# Patient Record
Sex: Male | Born: 1960 | Race: Black or African American | Hispanic: No | Marital: Single | Smoking: Former smoker
Health system: Southern US, Community
[De-identification: ages and names within clinical notes are randomized; demographics above are authoritative.]

---

## 2020-08-03 ENCOUNTER — Encounter (HOSPITAL_COMMUNITY): Payer: Self-pay | Admitting: Emergency Medicine

## 2020-08-03 ENCOUNTER — Inpatient Hospital Stay (HOSPITAL_COMMUNITY)
Admission: EM | Admit: 2020-08-03 | Discharge: 2020-08-21 | DRG: 064 | Disposition: A | Discharge status: Home / Self Care | Payer: Medicaid Other | Attending: Internal Medicine | Admitting: Internal Medicine

## 2020-08-03 ENCOUNTER — Emergency Department (HOSPITAL_COMMUNITY): Payer: Medicaid Other

## 2020-08-03 ENCOUNTER — Other Ambulatory Visit: Payer: Self-pay

## 2020-08-03 DIAGNOSIS — E876 Hypokalemia: Secondary | ICD-10-CM

## 2020-08-03 DIAGNOSIS — I43 Cardiomyopathy in diseases classified elsewhere: Secondary | ICD-10-CM | POA: Diagnosis present

## 2020-08-03 DIAGNOSIS — R001 Bradycardia, unspecified: Secondary | ICD-10-CM | POA: Diagnosis not present

## 2020-08-03 DIAGNOSIS — Z20822 Contact with and (suspected) exposure to covid-19: Secondary | ICD-10-CM | POA: Diagnosis present

## 2020-08-03 DIAGNOSIS — K117 Disturbances of salivary secretion: Secondary | ICD-10-CM

## 2020-08-03 DIAGNOSIS — I10 Essential (primary) hypertension: Secondary | ICD-10-CM

## 2020-08-03 DIAGNOSIS — Z800 Family history of malignant neoplasm of digestive organs: Secondary | ICD-10-CM

## 2020-08-03 DIAGNOSIS — I639 Cerebral infarction, unspecified: Secondary | ICD-10-CM | POA: Diagnosis present

## 2020-08-03 DIAGNOSIS — R471 Dysarthria and anarthria: Secondary | ICD-10-CM | POA: Diagnosis present

## 2020-08-03 DIAGNOSIS — F17210 Nicotine dependence, cigarettes, uncomplicated: Secondary | ICD-10-CM | POA: Diagnosis present

## 2020-08-03 DIAGNOSIS — R52 Pain, unspecified: Secondary | ICD-10-CM

## 2020-08-03 DIAGNOSIS — R1310 Dysphagia, unspecified: Secondary | ICD-10-CM

## 2020-08-03 DIAGNOSIS — R200 Anesthesia of skin: Secondary | ICD-10-CM

## 2020-08-03 DIAGNOSIS — R292 Abnormal reflex: Secondary | ICD-10-CM

## 2020-08-03 DIAGNOSIS — Y95 Nosocomial condition: Secondary | ICD-10-CM | POA: Diagnosis not present

## 2020-08-03 DIAGNOSIS — R109 Unspecified abdominal pain: Secondary | ICD-10-CM | POA: Diagnosis not present

## 2020-08-03 DIAGNOSIS — R54 Age-related physical debility: Secondary | ICD-10-CM | POA: Diagnosis present

## 2020-08-03 DIAGNOSIS — R258 Other abnormal involuntary movements: Secondary | ICD-10-CM | POA: Diagnosis present

## 2020-08-03 DIAGNOSIS — I69322 Dysarthria following cerebral infarction: Secondary | ICD-10-CM

## 2020-08-03 DIAGNOSIS — K5900 Constipation, unspecified: Secondary | ICD-10-CM | POA: Diagnosis not present

## 2020-08-03 DIAGNOSIS — Z681 Body mass index (BMI) 19 or less, adult: Secondary | ICD-10-CM

## 2020-08-03 DIAGNOSIS — Z4659 Encounter for fitting and adjustment of other gastrointestinal appliance and device: Secondary | ICD-10-CM

## 2020-08-03 DIAGNOSIS — I119 Hypertensive heart disease without heart failure: Secondary | ICD-10-CM | POA: Diagnosis present

## 2020-08-03 DIAGNOSIS — N179 Acute kidney failure, unspecified: Secondary | ICD-10-CM | POA: Clinically undetermined

## 2020-08-03 DIAGNOSIS — J690 Pneumonitis due to inhalation of food and vomit: Secondary | ICD-10-CM | POA: Diagnosis not present

## 2020-08-03 DIAGNOSIS — I69351 Hemiplegia and hemiparesis following cerebral infarction affecting right dominant side: Secondary | ICD-10-CM

## 2020-08-03 DIAGNOSIS — R1312 Dysphagia, oropharyngeal phase: Secondary | ICD-10-CM | POA: Diagnosis present

## 2020-08-03 DIAGNOSIS — R29810 Facial weakness: Secondary | ICD-10-CM | POA: Diagnosis present

## 2020-08-03 DIAGNOSIS — E538 Deficiency of other specified B group vitamins: Secondary | ICD-10-CM

## 2020-08-03 DIAGNOSIS — R29703 NIHSS score 3: Secondary | ICD-10-CM | POA: Diagnosis present

## 2020-08-03 DIAGNOSIS — E43 Unspecified severe protein-calorie malnutrition: Secondary | ICD-10-CM

## 2020-08-03 DIAGNOSIS — I63411 Cerebral infarction due to embolism of right middle cerebral artery: Principal | ICD-10-CM | POA: Diagnosis present

## 2020-08-03 LAB — I-STAT CHEM 8, ED
BUN: 30 mg/dL — ABNORMAL HIGH (ref 6–20)
Calcium, Ion: 1.22 mmol/L (ref 1.15–1.40)
Chloride: 109 mmol/L (ref 98–111)
Creatinine, Ser: 0.9 mg/dL (ref 0.61–1.24)
Glucose, Bld: 82 mg/dL (ref 70–99)
HCT: 41 % (ref 39.0–52.0)
Hemoglobin: 13.9 g/dL (ref 13.0–17.0)
Potassium: 3.7 mmol/L (ref 3.5–5.1)
Sodium: 145 mmol/L (ref 135–145)
TCO2: 22 mmol/L (ref 22–32)

## 2020-08-03 LAB — CBC
HCT: 41 % (ref 39.0–52.0)
Hemoglobin: 13.6 g/dL (ref 13.0–17.0)
MCH: 24.7 pg — ABNORMAL LOW (ref 26.0–34.0)
MCHC: 33.2 g/dL (ref 30.0–36.0)
MCV: 74.5 fL — ABNORMAL LOW (ref 80.0–100.0)
Platelets: 207 10*3/uL (ref 150–400)
RBC: 5.5 MIL/uL (ref 4.22–5.81)
RDW: 15.5 % (ref 11.5–15.5)
WBC: 5.2 10*3/uL (ref 4.0–10.5)
nRBC: 0 % (ref 0.0–0.2)

## 2020-08-03 LAB — COMPREHENSIVE METABOLIC PANEL
ALT: 11 U/L (ref 0–44)
AST: 16 U/L (ref 15–41)
Albumin: 4.4 g/dL (ref 3.5–5.0)
Alkaline Phosphatase: 76 U/L (ref 38–126)
Anion gap: 14 (ref 5–15)
BUN: 29 mg/dL — ABNORMAL HIGH (ref 6–20)
CO2: 21 mmol/L — ABNORMAL LOW (ref 22–32)
Calcium: 9.7 mg/dL (ref 8.9–10.3)
Chloride: 107 mmol/L (ref 98–111)
Creatinine, Ser: 1.1 mg/dL (ref 0.61–1.24)
GFR calc Af Amer: >60 mL/min (ref >60)
GFR calc non Af Amer: >60 mL/min (ref >60)
Glucose, Bld: 87 mg/dL (ref 70–99)
Potassium: 3.6 mmol/L (ref 3.5–5.1)
Sodium: 142 mmol/L (ref 135–145)
Total Bilirubin: 0.8 mg/dL (ref 0.3–1.2)
Total Protein: 7.7 g/dL (ref 6.5–8.1)

## 2020-08-03 LAB — PROTIME-INR
INR: 1.1 (ref 0.8–1.2)
Prothrombin Time: 13.4 seconds (ref 11.4–15.2)

## 2020-08-03 LAB — DIFFERENTIAL
Abs Immature Granulocytes: 0.01 10*3/uL (ref 0.00–0.07)
Basophils Absolute: 0 10*3/uL (ref 0.0–0.1)
Basophils Relative: 0 %
Eosinophils Absolute: 0 10*3/uL (ref 0.0–0.5)
Eosinophils Relative: 0 %
Immature Granulocytes: 0 %
Lymphocytes Relative: 33 %
Lymphs Abs: 1.7 10*3/uL (ref 0.7–4.0)
Monocytes Absolute: 0.4 10*3/uL (ref 0.1–1.0)
Monocytes Relative: 8 %
Neutro Abs: 3.1 10*3/uL (ref 1.7–7.7)
Neutrophils Relative %: 59 %

## 2020-08-03 LAB — APTT: aPTT: 32 seconds (ref 24–36)

## 2020-08-03 MED ORDER — IOHEXOL 350 MG/ML SOLN
75.0000 mL | Freq: Once | INTRAVENOUS | Status: AC | PRN
  Administered 2020-08-03: 75 mL via INTRAVENOUS

## 2020-08-03 MED ORDER — SODIUM CHLORIDE 0.9% FLUSH
3.0000 mL | Freq: Once | INTRAVENOUS | Status: AC
  Administered 2020-08-04: 3 mL via INTRAVENOUS

## 2020-08-03 MED ORDER — DIPHENHYDRAMINE HCL 50 MG/ML IJ SOLN
50.0000 mg | Freq: Once | INTRAMUSCULAR | Status: AC
  Administered 2020-08-03: 50 mg via INTRAVENOUS
  Filled 2020-08-03: qty 1

## 2020-08-03 NOTE — ED Notes (Signed)
Clara, sister, [phone] would like an update when available
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
## 2020-08-03 NOTE — ED Notes (Signed)
I tried to get the Patient labs twice no success.

## 2020-08-03 NOTE — ED Triage Notes (Signed)
Pt. Stated, I had some numbness yesterday and my sister brought me today. Some drooling some with mouth.

## 2020-08-03 NOTE — ED Provider Notes (Signed)
[facility] EMERGENCY DEPARTMENT Provider Note   CSN: [id number] Arrival date & time: 08/03/20  1409     History Chief Complaint  Patient presents with  . Cerebrovascular Accident    Adam [name] is a 59 y.o. male past medical history of hypertension only presents to the ED due to concerns for left arm and side numbness as well as drooling.  Patient states that this started yesterday relatively suddenly and has worsened since, denies any history of the same peer denies headache or trauma, recent change in medications, states his only medicine is lisinopril, denies drug use other than marijuana which he uses occasionally, does smoke.  The history is provided by the patient.  Illness Quality:  Left arm numbness, drooling Severity:  Severe Onset quality:  Sudden Duration:  1 day Timing:  Constant Progression:  Worsening Chronicity:  New Context:  No inciting event Associated symptoms: no abdominal pain, no chest pain, no cough, no fever, no headaches, no rash, no shortness of breath and no vomiting        History reviewed. No pertinent past medical history.  Patient Active Problem List   Diagnosis Date Noted  . Acute CVA (cerebrovascular accident) (HCC) 08/04/2020    History reviewed. No pertinent surgical history.     No family history on file.  Social History   Tobacco Use  . Smoking status: Current Every Day Smoker  . Smokeless tobacco: Never Used  Substance Use Topics  . Alcohol use: Not Currently  . Drug use: Not Currently    Home Medications Prior to Admission medications   Not on File    Allergies    Patient has no known allergies.  Review of Systems   Review of Systems  Constitutional: Negative for chills and fever.  HENT: Positive for drooling and trouble swallowing. Negative for facial swelling and voice change.   Eyes: Negative for redness and visual disturbance.  Respiratory: Negative for cough and shortness of breath.     Cardiovascular: Negative for chest pain and palpitations.  Gastrointestinal: Negative for abdominal pain and vomiting.  Genitourinary: Negative for difficulty urinating and dysuria.  Musculoskeletal: Negative for gait problem and joint swelling.  Skin: Negative for rash and wound.  Neurological: Positive for numbness. Negative for dizziness, weakness and headaches.  Psychiatric/Behavioral: Negative for agitation and confusion.    Physical Exam Updated Vital Signs BP 113/86 (BP Location: Right Arm)   Pulse 76   Temp 98.6 F (37 C) (Oral)   Resp 16   SpO2 99%   Physical Exam Constitutional:      General: He is not in acute distress. HENT:     Head: Normocephalic and atraumatic.     Mouth/Throat:     Mouth: Mucous membranes are moist.     Pharynx: Oropharynx is clear.     Comments: Facial grimace noted, grossly symmetric with drooling.  Fasciculations noted to the tongue without deviation Eyes:     General: No scleral icterus.    Extraocular Movements: Extraocular movements intact.     Pupils: Pupils are equal, round, and reactive to light.  Cardiovascular:     Rate and Rhythm: Normal rate and regular rhythm.     Pulses: Normal pulses.  Pulmonary:     Effort: Pulmonary effort is normal. No respiratory distress.  Abdominal:     General: There is no distension.     Tenderness: There is no abdominal tenderness.  Musculoskeletal:        General: No tenderness  or deformity.     Cervical back: Normal range of motion and neck supple.  Neurological:     Mental Status: He is alert and oriented to person, place, and time.     Sensory: Sensory deficit present.     Motor: Weakness present.     Gait: Gait normal.     Deep Tendon Reflexes: Reflexes abnormal.     Reflex Scores:      Brachioradialis reflexes are 4+ on the right side and 4+ on the left side.      Patellar reflexes are 4+ on the right side and 4+ on the left side.      Achilles reflexes are 4+ on the right side and 4+  on the left side.    Comments: Trace left upper extremity weakness appreciated, no sensation to noxious stimuli to the left upper arm and upper chest.  Clonus present throughout  Psychiatric:        Mood and Affect: Mood normal.        Behavior: Behavior normal.     ED Results / Procedures / Treatments   Labs (all labs ordered are listed, but only abnormal results are displayed) Labs Reviewed  CBC - Abnormal; Notable for the following components:      Result Value   MCV 74.5 (*)    MCH 24.7 (*)    All other components within normal limits  COMPREHENSIVE METABOLIC PANEL - Abnormal; Notable for the following components:   CO2 21 (*)    BUN 29 (*)    All other components within normal limits  I-STAT CHEM 8, ED - Abnormal; Notable for the following components:   BUN 30 (*)    All other components within normal limits  SARS CORONAVIRUS 2 BY RT PCR (HOSPITAL ORDER, PERFORMED IN [facility])  PROTIME-INR  APTT  DIFFERENTIAL  URINALYSIS, COMPLETE (UACMP) WITH MICROSCOPIC  AMMONIA  RAPID URINE DRUG SCREEN, HOSP PERFORMED  VITAMIN B12  FOLATE  RPR  ETHANOL  VITAMIN B1  CBG MONITORING, ED    EKG EKG Interpretation  Date/Time:  Saturday August 03 2020 14:17:04 EDT Ventricular Rate:  82 PR Interval:  178 QRS Duration: 128 QT Interval:  368 QTC Calculation: 429 R Axis:   60 Text Interpretation: Normal sinus rhythm Non-specific intra-ventricular conduction block Minimal voltage criteria for LVH, may be normal variant ( Sokolow-Lyon ) Abnormal ECG No old tracing to compare Confirmed by Meridee Score [phone]) on 08/03/2020 9:25:19 PM   Radiology CT Angio Head W or Wo Contrast  Result Date: 08/03/2020 CLINICAL DATA:  Neuro deficit, acute, stroke suspected. Additional history provided: Numbness yesterday. Drooling. EXAM: CT ANGIOGRAPHY HEAD AND NECK TECHNIQUE: Multidetector CT imaging of the head and neck was performed using the standard protocol during bolus  administration of intravenous contrast. Multiplanar CT image reconstructions and MIPs were obtained to evaluate the vascular anatomy. Carotid stenosis measurements (when applicable) are obtained utilizing NASCET criteria, using the distal internal carotid diameter as the denominator. CONTRAST:  19mL OMNIPAQUE IOHEXOL 350 MG/ML SOLN COMPARISON:  No pertinent prior exams are available for comparison. FINDINGS: CT HEAD FINDINGS Brain: Mild generalized parenchymal atrophy. There are multiple chronic appearing lacunar infarcts within the basal ganglia bilaterally. Background advanced ill-defined hypoattenuation within the cerebral white matter is nonspecific, but consistent with chronic small vessel ischemic disease. Subcentimeter age-indeterminate infarct within right cerebellar hemisphere (series 5, image 13). There is no acute intracranial hemorrhage. No demarcated cortical infarct is identified. No extra-axial fluid collection. No evidence  of intracranial mass. No midline shift. Vascular: Reported below. Skull: Normal. Negative for fracture or focal lesion. Sinuses: No significant paranasal sinus disease or mastoid effusion at the imaged levels. Orbits: No acute finding. Chronic fracture deformity of the right lamina papyracea. Review of the MIP images confirms the above findings CTA NECK FINDINGS Aortic arch: Common origin of the innominate and left common carotid arteries. The visualized aortic arch is unremarkable. No hemodynamically significant innominate or proximal subclavian artery stenosis. Right carotid system: CCA and ICA patent within the neck without stenosis. Left carotid system: CCA and ICA patent within the neck without stenosis. Vertebral arteries: Codominant and patent within the neck without stenosis Skeleton: No acute bony abnormality or aggressive osseous lesion. Cervical spondylosis with multilevel disc space narrowing, disc bulges, uncovertebral and facet hypertrophy. Other neck: No neck mass or  cervical lymphadenopathy. Thyroid unremarkable. Upper chest: No consolidation within the imaged lung apices. Review of the MIP images confirms the above findings CTA HEAD FINDINGS Anterior circulation: The intracranial internal carotid arteries are patent. The M1 middle cerebral arteries are patent. There is moderate stenosis within the distal M1 right middle cerebral artery. No definite right M2 proximal branch occlusion is identified. However, there is a paucity of visualized proximal superior division right M2 MCA branches. The right anterior cerebral artery is patent without significant proximal stenosis. There is a high-grade focal stenosis within the distal A2/proximal A3 left anterior cerebral artery. No intracranial aneurysm is identified. Posterior circulation: The intracranial vertebral arteries are patent. The basilar artery is patent. The posterior cerebral arteries are patent. There is a high-grade focal stenosis within the P2 left PCA. Additionally, there are multifocal high-grade stenoses within the P4 right PCA. Posterior communicating arteries are present bilaterally. Venous sinuses: Within limitations of contrast timing, no convincing thrombus. Anatomic variants: None significant. Review of the MIP images confirms the above findings These results were called by telephone at the time of interpretation on 08/03/2020 at 8:28 pm to provider [facility] , who verbally acknowledged these results. IMPRESSION: CT head: 1. Subcentimeter age-indeterminate infarct within the right cerebellum. 2. Multiple chronic appearing lacunar infarcts within the bilateral basal ganglia. 3. Background mild generalized parenchymal atrophy with advanced chronic small vessel ischemic disease. CTA neck: The common carotid, internal carotid and vertebral arteries are patent within the neck without hemodynamically significant stenosis. CTA head: 1. No definite proximal right M2 branch occlusion is identified. However, there is  a paucity of visualized proximal M2 superior division right MCA branch vessels. If there is concern for an acute right MCA territory stroke, consider catheter based angiography for further evaluation. 2. Intracranial atherosclerotic disease with multifocal stenoses, most notably as follows. 3. Moderate stenosis within the distal M1 right middle cerebral artery. 4. High-grade focal stenosis within the distal A2/proximal A3 left anterior cerebral artery. 5. High-grade focal stenosis within the P2 left PCA. 6. High-grade stenoses within the P4 right PCA. Electronically Signed   By: Jackey Loge DO   On: 08/03/2020 20:29   CT ANGIO NECK W OR WO CONTRAST  Result Date: 08/03/2020 CLINICAL DATA:  Neuro deficit, acute, stroke suspected. Additional history provided: Numbness yesterday. Drooling. EXAM: CT ANGIOGRAPHY HEAD AND NECK TECHNIQUE: Multidetector CT imaging of the head and neck was performed using the standard protocol during bolus administration of intravenous contrast. Multiplanar CT image reconstructions and MIPs were obtained to evaluate the vascular anatomy. Carotid stenosis measurements (when applicable) are obtained utilizing NASCET criteria, using the distal internal carotid diameter as the denominator. CONTRAST:  52mL OMNIPAQUE IOHEXOL 350 MG/ML SOLN COMPARISON:  No pertinent prior exams are available for comparison. FINDINGS: CT HEAD FINDINGS Brain: Mild generalized parenchymal atrophy. There are multiple chronic appearing lacunar infarcts within the basal ganglia bilaterally. Background advanced ill-defined hypoattenuation within the cerebral white matter is nonspecific, but consistent with chronic small vessel ischemic disease. Subcentimeter age-indeterminate infarct within right cerebellar hemisphere (series 5, image 13). There is no acute intracranial hemorrhage. No demarcated cortical infarct is identified. No extra-axial fluid collection. No evidence of intracranial mass. No midline shift.  Vascular: Reported below. Skull: Normal. Negative for fracture or focal lesion. Sinuses: No significant paranasal sinus disease or mastoid effusion at the imaged levels. Orbits: No acute finding. Chronic fracture deformity of the right lamina papyracea. Review of the MIP images confirms the above findings CTA NECK FINDINGS Aortic arch: Common origin of the innominate and left common carotid arteries. The visualized aortic arch is unremarkable. No hemodynamically significant innominate or proximal subclavian artery stenosis. Right carotid system: CCA and ICA patent within the neck without stenosis. Left carotid system: CCA and ICA patent within the neck without stenosis. Vertebral arteries: Codominant and patent within the neck without stenosis Skeleton: No acute bony abnormality or aggressive osseous lesion. Cervical spondylosis with multilevel disc space narrowing, disc bulges, uncovertebral and facet hypertrophy. Other neck: No neck mass or cervical lymphadenopathy. Thyroid unremarkable. Upper chest: No consolidation within the imaged lung apices. Review of the MIP images confirms the above findings CTA HEAD FINDINGS Anterior circulation: The intracranial internal carotid arteries are patent. The M1 middle cerebral arteries are patent. There is moderate stenosis within the distal M1 right middle cerebral artery. No definite right M2 proximal branch occlusion is identified. However, there is a paucity of visualized proximal superior division right M2 MCA branches. The right anterior cerebral artery is patent without significant proximal stenosis. There is a high-grade focal stenosis within the distal A2/proximal A3 left anterior cerebral artery. No intracranial aneurysm is identified. Posterior circulation: The intracranial vertebral arteries are patent. The basilar artery is patent. The posterior cerebral arteries are patent. There is a high-grade focal stenosis within the P2 left PCA. Additionally, there are  multifocal high-grade stenoses within the P4 right PCA. Posterior communicating arteries are present bilaterally. Venous sinuses: Within limitations of contrast timing, no convincing thrombus. Anatomic variants: None significant. Review of the MIP images confirms the above findings These results were called by telephone at the time of interpretation on 08/03/2020 at 8:28 pm to provider [facility] , who verbally acknowledged these results. IMPRESSION: CT head: 1. Subcentimeter age-indeterminate infarct within the right cerebellum. 2. Multiple chronic appearing lacunar infarcts within the bilateral basal ganglia. 3. Background mild generalized parenchymal atrophy with advanced chronic small vessel ischemic disease. CTA neck: The common carotid, internal carotid and vertebral arteries are patent within the neck without hemodynamically significant stenosis. CTA head: 1. No definite proximal right M2 branch occlusion is identified. However, there is a paucity of visualized proximal M2 superior division right MCA branch vessels. If there is concern for an acute right MCA territory stroke, consider catheter based angiography for further evaluation. 2. Intracranial atherosclerotic disease with multifocal stenoses, most notably as follows. 3. Moderate stenosis within the distal M1 right middle cerebral artery. 4. High-grade focal stenosis within the distal A2/proximal A3 left anterior cerebral artery. 5. High-grade focal stenosis within the P2 left PCA. 6. High-grade stenoses within the P4 right PCA. Electronically Signed   By: Jackey Loge DO   On: 08/03/2020 20:29  Procedures Procedures (including critical care time)  Medications Ordered in ED Medications  sodium chloride flush (NS) 0.9 % injection 3 mL (has no administration in time range)  iohexol (OMNIPAQUE) 350 MG/ML injection 75 mL (75 mLs Intravenous Contrast Given 08/03/20 1950)  diphenhydrAMINE (BENADRYL) injection 50 mg (50 mg Intravenous Given  08/03/20 2332)    ED Course  I have reviewed the triage vital signs and the nursing notes.  Pertinent labs & imaging results that were available during my care of the patient were reviewed by me and considered in my medical decision making (see chart for details).  Clinical Course as of Aug 04 26  Sat Aug 03, 2020  [id number] 59 year old male here for evaluation of difficulty swallowing and some left arm and chest numbness.  Sounds like it started last evening.  He was evaluated at triage and had imaging done that shows significant stenotic findings on angio.  He is ambulatory in the room and does not seem to have any focal weakness.  His his mouth is drawn up in a grimace but he is able to speak without any difficulty.  Drooling.  Smokes cigarettes denies any drugs.  Infrequent marijuana.  Only on lisinopril.  Discussed with neurology who will evaluate the patient in the ED.   [MB]    Clinical Course User Index [MB] Terrilee FilesButler, Michael C, MD   MDM Rules/Calculators/A&P                          EKG findings by my read: Compared to prior: None.  Rate: 82 rhythm: sinus Axis: appropriate  PR: 130 QRS: 128 QTc: 429.  High voltage EKG, likely due to the patient's habitus with some baseline artifact limiting interpretation, otherwise no evidence of ischemia or arrhythmia, nor any other pathologic findings concerning considering patient presentation. Findings discussed with attending who agrees.  Differential diagnosis considered:  CVA, metabolic encephalopathy, demyelinating polyneuropathy, malignancy, encephalitis  Patient presents the ED for 1 day of focal numbness to the left upper chest wall and left upper extremity and my exam is concerning for severe hyper reflexia throughout along with some degree of pharyngeal hypotonia of uncertain etiology rendering the patient unable to tolerate secretions well.  CTA obtained from the lobby with numerous foci of microvascular disease, some stenosis of the right  MCA as well as bilateral PCAs.  This does not correlate with well with patient's exam but does indicate severe vasculopathic disease  Given his nonspecific very abnormal neurological exam, neurology consulted for recommendations, will add on RPR, B12, folate and a toxicologic work-up including UDS  Per neurology's history and exam, the also noted the patient has lost significant amount of weight recently and they are more concerned for possible malignancy versus space-occupying lesion of the brainstem, suggesting MRI brain with/without.  Hospital medicine consulted for admission, accepted by Dr. Margo AyeHall.  Labs reviewed and interpreted by myself with significant findings above. Imaging reviewed by myself and interpreted by radiologist.  Case and plan above discussed with my attending Dr. Charm BargesButler   Final Clinical Impression(s) / ED Diagnoses Final diagnoses:  Drooling  Left arm numbness  Generalized hyperreflexia    Rx / DC Orders ED Discharge Orders    None     Labs, studies and imaging reviewed by myself and considered in medical decision making if ordered. Imaging interpreted by radiology. Pt was discussed with my attending, Dr. Charm BargesButler  Electronically signed by:  Christiane HaJonathan Redding9/12/202112:27 AM  Loree Fee, MD 08/04/20 0028    Terrilee Files, MD 08/04/20 1100

## 2020-08-04 ENCOUNTER — Inpatient Hospital Stay (HOSPITAL_COMMUNITY): Payer: Medicaid Other

## 2020-08-04 ENCOUNTER — Other Ambulatory Visit (HOSPITAL_COMMUNITY): Payer: Medicaid Other

## 2020-08-04 DIAGNOSIS — F17210 Nicotine dependence, cigarettes, uncomplicated: Secondary | ICD-10-CM | POA: Diagnosis present

## 2020-08-04 DIAGNOSIS — I69351 Hemiplegia and hemiparesis following cerebral infarction affecting right dominant side: Secondary | ICD-10-CM | POA: Diagnosis not present

## 2020-08-04 DIAGNOSIS — K117 Disturbances of salivary secretion: Secondary | ICD-10-CM | POA: Diagnosis present

## 2020-08-04 DIAGNOSIS — I63411 Cerebral infarction due to embolism of right middle cerebral artery: Secondary | ICD-10-CM | POA: Diagnosis present

## 2020-08-04 DIAGNOSIS — E43 Unspecified severe protein-calorie malnutrition: Secondary | ICD-10-CM | POA: Diagnosis present

## 2020-08-04 DIAGNOSIS — E538 Deficiency of other specified B group vitamins: Secondary | ICD-10-CM | POA: Diagnosis present

## 2020-08-04 DIAGNOSIS — R258 Other abnormal involuntary movements: Secondary | ICD-10-CM | POA: Diagnosis present

## 2020-08-04 DIAGNOSIS — I43 Cardiomyopathy in diseases classified elsewhere: Secondary | ICD-10-CM | POA: Diagnosis present

## 2020-08-04 DIAGNOSIS — R1310 Dysphagia, unspecified: Secondary | ICD-10-CM | POA: Diagnosis not present

## 2020-08-04 DIAGNOSIS — K5900 Constipation, unspecified: Secondary | ICD-10-CM | POA: Diagnosis not present

## 2020-08-04 DIAGNOSIS — I69322 Dysarthria following cerebral infarction: Secondary | ICD-10-CM | POA: Diagnosis not present

## 2020-08-04 DIAGNOSIS — R54 Age-related physical debility: Secondary | ICD-10-CM | POA: Diagnosis present

## 2020-08-04 DIAGNOSIS — D518 Other vitamin B12 deficiency anemias: Secondary | ICD-10-CM

## 2020-08-04 DIAGNOSIS — I6389 Other cerebral infarction: Secondary | ICD-10-CM | POA: Diagnosis not present

## 2020-08-04 DIAGNOSIS — R1312 Dysphagia, oropharyngeal phase: Secondary | ICD-10-CM | POA: Diagnosis present

## 2020-08-04 DIAGNOSIS — I639 Cerebral infarction, unspecified: Secondary | ICD-10-CM | POA: Diagnosis not present

## 2020-08-04 DIAGNOSIS — Z800 Family history of malignant neoplasm of digestive organs: Secondary | ICD-10-CM | POA: Diagnosis not present

## 2020-08-04 DIAGNOSIS — R471 Dysarthria and anarthria: Secondary | ICD-10-CM | POA: Diagnosis present

## 2020-08-04 DIAGNOSIS — E440 Moderate protein-calorie malnutrition: Secondary | ICD-10-CM

## 2020-08-04 DIAGNOSIS — I119 Hypertensive heart disease without heart failure: Secondary | ICD-10-CM | POA: Diagnosis present

## 2020-08-04 DIAGNOSIS — N179 Acute kidney failure, unspecified: Secondary | ICD-10-CM | POA: Diagnosis present

## 2020-08-04 DIAGNOSIS — Z681 Body mass index (BMI) 19 or less, adult: Secondary | ICD-10-CM | POA: Diagnosis not present

## 2020-08-04 DIAGNOSIS — R29810 Facial weakness: Secondary | ICD-10-CM | POA: Diagnosis present

## 2020-08-04 DIAGNOSIS — J690 Pneumonitis due to inhalation of food and vomit: Secondary | ICD-10-CM | POA: Diagnosis not present

## 2020-08-04 DIAGNOSIS — R109 Unspecified abdominal pain: Secondary | ICD-10-CM | POA: Diagnosis not present

## 2020-08-04 DIAGNOSIS — I10 Essential (primary) hypertension: Secondary | ICD-10-CM | POA: Diagnosis not present

## 2020-08-04 DIAGNOSIS — R29703 NIHSS score 3: Secondary | ICD-10-CM | POA: Diagnosis present

## 2020-08-04 DIAGNOSIS — R001 Bradycardia, unspecified: Secondary | ICD-10-CM | POA: Diagnosis not present

## 2020-08-04 DIAGNOSIS — Y95 Nosocomial condition: Secondary | ICD-10-CM | POA: Diagnosis not present

## 2020-08-04 DIAGNOSIS — E876 Hypokalemia: Secondary | ICD-10-CM | POA: Diagnosis present

## 2020-08-04 DIAGNOSIS — Z20822 Contact with and (suspected) exposure to covid-19: Secondary | ICD-10-CM | POA: Diagnosis present

## 2020-08-04 HISTORY — DX: Unspecified severe protein-calorie malnutrition: E43

## 2020-08-04 LAB — ETHANOL: Alcohol, Ethyl (B): <10 mg/dL (ref <10)

## 2020-08-04 LAB — LIPID PANEL
Cholesterol: 123 mg/dL (ref 0–200)
HDL: 33 mg/dL — ABNORMAL LOW (ref >40)
LDL Cholesterol: 80 mg/dL (ref 0–99)
Total CHOL/HDL Ratio: 3.7 RATIO
Triglycerides: 49 mg/dL (ref <150)
VLDL: 10 mg/dL (ref 0–40)

## 2020-08-04 LAB — VITAMIN B12: Vitamin B-12: 90 pg/mL — ABNORMAL LOW (ref 180–914)

## 2020-08-04 LAB — SARS CORONAVIRUS 2 BY RT PCR (HOSPITAL ORDER, PERFORMED IN ~~LOC~~ HOSPITAL LAB): SARS Coronavirus 2: NEGATIVE

## 2020-08-04 LAB — AMMONIA: Ammonia: 37 umol/L — ABNORMAL HIGH (ref 9–35)

## 2020-08-04 LAB — RPR: RPR Ser Ql: NONREACTIVE

## 2020-08-04 LAB — FOLATE: Folate: 18.1 ng/mL (ref >5.9)

## 2020-08-04 MED ORDER — CLOPIDOGREL BISULFATE 75 MG PO TABS: 75.0000 mg | ORAL_TABLET | Freq: Every day | ORAL | Status: DC

## 2020-08-04 MED ORDER — HYDROCHLOROTHIAZIDE 25 MG PO TABS
25.0000 mg | ORAL_TABLET | Freq: Every day | ORAL | Status: DC
  Administered 2020-08-04 – 2020-08-10 (×5): 25 mg via ORAL
  Filled 2020-08-04 (×6): qty 1

## 2020-08-04 MED ORDER — ATORVASTATIN CALCIUM 80 MG PO TABS
80.0000 mg | ORAL_TABLET | Freq: Every day | ORAL | Status: DC
  Administered 2020-08-05 – 2020-08-10 (×4): 80 mg via ORAL
  Filled 2020-08-04 (×3): qty 1
  Filled 2020-08-04: qty 2
  Filled 2020-08-04: qty 1

## 2020-08-04 MED ORDER — ASPIRIN 300 MG RE SUPP
300.0000 mg | Freq: Every day | RECTAL | Status: DC
  Administered 2020-08-04 (×2): 300 mg via RECTAL
  Filled 2020-08-04 (×2): qty 1

## 2020-08-04 MED ORDER — ENOXAPARIN SODIUM 40 MG/0.4ML ~~LOC~~ SOLN
40.0000 mg | Freq: Every day | SUBCUTANEOUS | Status: DC
  Administered 2020-08-04 – 2020-08-21 (×17): 40 mg via SUBCUTANEOUS
  Filled 2020-08-04 (×17): qty 0.4

## 2020-08-04 MED ORDER — VITAMIN B-12 1000 MCG PO TABS
500.0000 ug | ORAL_TABLET | Freq: Every day | ORAL | Status: DC
  Filled 2020-08-04: qty 1

## 2020-08-04 MED ORDER — TAB-A-VITE/IRON PO TABS
1.0000 | ORAL_TABLET | Freq: Every day | ORAL | Status: DC
  Administered 2020-08-05 – 2020-08-10 (×4): 1 via ORAL
  Filled 2020-08-04 (×7): qty 1

## 2020-08-04 MED ORDER — VITAMIN B-12 1000 MCG PO TABS
1000.0000 ug | ORAL_TABLET | Freq: Every day | ORAL | Status: DC
  Administered 2020-08-07 – 2020-08-10 (×3): 1000 ug via ORAL
  Filled 2020-08-04 (×5): qty 1

## 2020-08-04 MED ORDER — GADOBUTROL 1 MMOL/ML IV SOLN
6.0000 mL | Freq: Once | INTRAVENOUS | Status: AC | PRN
  Administered 2020-08-04: 6 mL via INTRAVENOUS

## 2020-08-04 MED ORDER — ASPIRIN 81 MG PO CHEW
81.0000 mg | CHEWABLE_TABLET | Freq: Every day | ORAL | Status: DC
  Administered 2020-08-05: 81 mg via ORAL
  Filled 2020-08-04 (×2): qty 1

## 2020-08-04 MED ORDER — CYANOCOBALAMIN 1000 MCG/ML IJ SOLN
1000.0000 ug | Freq: Once | INTRAMUSCULAR | Status: AC
  Administered 2020-08-04: 1000 ug via INTRAMUSCULAR
  Filled 2020-08-04 (×2): qty 1

## 2020-08-04 MED ORDER — DEXTROSE-NACL 5-0.9 % IV SOLN: INTRAVENOUS | Status: DC

## 2020-08-04 MED ORDER — HYDRALAZINE HCL 20 MG/ML IJ SOLN: 5.0000 mg | Freq: Four times a day (QID) | INTRAMUSCULAR | Status: DC | PRN

## 2020-08-04 MED ORDER — CLOPIDOGREL BISULFATE 75 MG PO TABS
75.0000 mg | ORAL_TABLET | Freq: Every day | ORAL | Status: DC
  Administered 2020-08-05 – 2020-08-10 (×4): 75 mg via ORAL
  Filled 2020-08-04 (×5): qty 1

## 2020-08-04 MED ORDER — RESOURCE THICKENUP CLEAR PO POWD
ORAL | Status: DC | PRN
  Filled 2020-08-04 (×3): qty 125

## 2020-08-04 MED ORDER — THIAMINE HCL 100 MG PO TABS
100.0000 mg | ORAL_TABLET | Freq: Every day | ORAL | Status: DC
  Administered 2020-08-04 – 2020-08-10 (×5): 100 mg via ORAL
  Filled 2020-08-04 (×6): qty 1
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
## 2020-08-04 NOTE — Progress Notes (Addendum)
PROGRESS NOTE    Adam [name]  ZOX:[id number] DOB: [birth year] DOA: 08/03/2020 PCP: Patient, [name]    Brief Narrative:  Patient admitted to the hospital with a working diagnosis of right cerebellar infarct.  59 year old male with significant past medical history for hypertension, prior CVA 2013. She reported left arm numbness and drooling. She also had difficulty speaking and abnormal gait. On her initial physical examination blood pressure 113/86, heart rate 76, respiratory rate 16, oxygen saturation 99%. Her lungs were clear to auscultation bilaterally, heart S1-S2, present rhythmic, soft abdomen, no lower extremity edema. Patient was awake and alert, she had numbness at the left upper extremity. Strength preserved. Sodium 142, potassium 3.6, chloride 107, bicarb 21, glucose 87, BUN 29, creatinine 1.1, white count 5.2, hemoglobin 13.6, hematocrit 41.0, platelets 207. SARS COVID-19 negative.  Head CT with subcentimeter age indeterminate infarct within the right cerebellum. Multiple chronic appearing lacunar infarcts within bilateral basal ganglia. CT angiography of the neck no significant stenosis. CT angiography of the head no definite proximal right M2 branch occlusion identified. Intracranial atherosclerosis disease with multifocal stenosis. High-grade focal stenosis distal A2/proximal A3 left anterior cerebral artery. High-grade focal stenosis within the P2 left PCA, high-grade stenosis within P4 right PCA. EKG 82 bpm, normal axis, normal intervals, sinus rhythm, poor R wave progression, no ST segment or T wave changes, positive LVH.   Assessment & Plan:   Principal Problem:   Acute CVA (cerebrovascular accident) (HCC) Active Problems:   HTN (hypertension)   B12 deficiency   Protein-calorie malnutrition, moderate (HCC)   1. Acute right cerebellum ischemic infarct. Continue to have speech difficulty, no nausea or vomiting. Further work up with brain MRI   Continue neuro  checks per unit protocol, antiplatelet therapy with asa and clopidogrel, statin therapy and blood pressure control.   Pending echocardiogram, patient may need TEE and loop recorder, case discussed with Dr Pearlean Brownie from Neurology.  Pending echocardiogram, PT and OT evaluation. Follow up on lipid panel and Hgb A1c.  Pending urine drug screen.   2. B12 deficiency/ moderate to severe calorie protein malnutrition. B 12 level 90, folate 181. Continue with B12, thiamine and multivitamins supplementation and consult nutrition for other nutritional supplements.   3. Uncontrolled HTN. Blood pressure 175/105 mmHg. Positive hypertensive cardiomyopathy features per EKG.  Will start patient on HCTZ and continue adjustments, avoid rapid blood pressure correction in the setting of acute CVA. Target blood pressure less than 180/90.     Status is: Inpatient  Remains inpatient appropriate because:Inpatient level of care appropriate due to severity of illness   Dispo:  Patient From: Home  Planned Disposition: Home with Health Care Svc  Expected discharge date: 08/06/20  Medically stable for discharge: No    DVT prophylaxis: Enoxaparin   Code Status:   full  Family Communication:  No family at the bedside      Consultants:   Neurology     Subjective: Patient is awake and alert, his symptoms are improving but not yet back to baseline, he is very weak and deconditioned.   Objective: Vitals:   08/04/20 0530 08/04/20 0745 08/04/20 0830 08/04/20 0915  BP:  (!) 175/102 (!) 165/101 (!) 167/119  Pulse: (!) 52 (!) 57 64 (!) 58  Resp: 17 20 18  (!) 23  Temp:      TempSrc:      SpO2: 97% 98% 98% 97%   No intake or output data in the 24 hours ending 08/04/20 1155 There were no vitals filed  for this visit.  Examination:   General: Not in pain or dyspnea, deconditioned  Neurology: Awake and alert, preserved strength, no facial asymmetry  E ENT: no pallor, no icterus, oral mucosa  moist Cardiovascular: No JVD. S1-S2 present, rhythmic, no gallops, rubs, or murmurs. No lower extremity edema. Pulmonary: vesicular breath sounds bilaterally, adequate air movement, no wheezing, rhonchi or rales. Gastrointestinal. Abdomen soft and non tender SKIN: no rashes Musculoskeletal: no joint deformities     Data Reviewed: I have personally reviewed following labs and imaging studies  CBC: Recent Labs  Lab 08/03/20 1427 08/03/20 1438  WBC 5.2  --   NEUTROABS 3.1  --   HGB 13.6 13.9  HCT 41.0 41.0  MCV 74.5*  --   PLT 207  --    Basic Metabolic Panel: Recent Labs  Lab 08/03/20 1427 08/03/20 1438  NA 142 145  K 3.6 3.7  CL 107 109  CO2 21*  --   GLUCOSE 87 82  BUN 29* 30*  CREATININE 1.10 0.90  CALCIUM 9.7  --    GFR: CrCl cannot be calculated (Unknown ideal weight.). Liver Function Tests: Recent Labs  Lab 08/03/20 1427  AST 16  ALT 11  ALKPHOS 76  BILITOT 0.8  PROT 7.7  ALBUMIN 4.4   No results for input(s): LIPASE, AMYLASE in the last 168 hours. Recent Labs  Lab 08/03/20 2148  AMMONIA 37*   Coagulation Profile: Recent Labs  Lab 08/03/20 1427  INR 1.1   Cardiac Enzymes: No results for input(s): CKTOTAL, CKMB, CKMBINDEX, TROPONINI in the last 168 hours. BNP (last 3 results) No results for input(s): PROBNP in the last 8760 hours. HbA1C: No results for input(s): HGBA1C in the last 72 hours. CBG: No results for input(s): GLUCAP in the last 168 hours. Lipid Profile: No results for input(s): CHOL, HDL, LDLCALC, TRIG, CHOLHDL, LDLDIRECT in the last 72 hours. Thyroid Function Tests: No results for input(s): TSH, T4TOTAL, FREET4, T3FREE, THYROIDAB in the last 72 hours. Anemia Panel: Recent Labs    08/03/20 2355  VITAMINB12 90*  FOLATE 18.1      Radiology Studies: I have reviewed all of the imaging during this hospital visit personally     Scheduled Meds: . [START ON 08/05/2020] aspirin  81 mg Oral Daily  . aspirin  300 mg Rectal  Daily  . atorvastatin  80 mg Oral Daily  . enoxaparin (LOVENOX) injection  40 mg Subcutaneous Daily  . sodium chloride flush  3 mL Intravenous Once  . vitamin B-12  500 mcg Oral Daily   Continuous Infusions: . dextrose 5 % and 0.9% NaCl 30 mL/hr at 08/04/20 0314     LOS: 0 days        Coralyn Roselli Annett Gula, MD

## 2020-08-04 NOTE — ED Notes (Signed)
Shawn sister in law [phone] would like an update on the pt

## 2020-08-04 NOTE — ED Notes (Signed)
[phone], Huel Coventry brother and Elzie Rings would like an update

## 2020-08-04 NOTE — Evaluation (Signed)
Clinical/Bedside Swallow Evaluation Patient Details  Name: Adam [name] MRN: [id number] Date of Birth: 09/11/1961  Today's Date: 08/04/2020 Time: SLP Start Time (ACUTE ONLY): 1220 SLP Stop Time (ACUTE ONLY): 1240 SLP Time Calculation (min) (ACUTE ONLY): 20 min  Past Medical History: History reviewed. No pertinent past medical history. Past Surgical History: History reviewed. No pertinent surgical history. HPI:  Patient is a 59 y.o. 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 Per chart review, his sister had reported she noticed bifacial weakness, significant worsened speech and significantly worsened gait. MRI brain revealed patchy small volume acute right MCA territory infarcts involving right frontoparietal region but no associated hemorrhage or mass effect, as well as underlying advanced cerebral atrophy and chronic microvascular ischemic disease, multiple remote lacunar infarcts about the basal ganglia and right cerebellum.   Assessment / Plan / Recommendation Clinical Impression  Patient presents with a moderate-severe oral and a moderate pharyngeal dysphagia but without overt s/s of aspiration or penetration. He had an open-mouth posture at rest and although he would verbally respond to questions, he did not initiate to comment or request and in general, appeared to be non-verbal. Patient with odd affect overall and accepted liquids, solids from SLP but was flat and nonresponsive. 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 He also exhibited oral holding of thin and honey thick liquids leading to anterior spillage as he would have an open mouth posture unless actively swallowing. Although patient did reach for towel to manage anterior spillage, he did not attempt to close lips or  swallow oral residuals. 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 SLP Visit Diagnosis: Dysphagia, unspecified (R13.10)    Aspiration Risk  Mild aspiration risk;Moderate aspiration risk    Diet Recommendation Dysphagia 1 (Puree);Honey-thick liquid   Liquid Administration via: Cup Medication Administration: Crushed with puree Supervision: Full supervision/cueing for compensatory strategies Compensations: Minimize environmental distractions;Slow rate;Small sips/bites;Monitor for anterior loss Postural Changes: Seated upright at 90 degrees    Other  Recommendations Oral Care Recommendations: Oral care BID;Staff/trained caregiver to provide oral care   Follow up Recommendations 24 hour supervision/assistance;Skilled Nursing facility;Inpatient Rehab      Frequency and Duration min 2x/week  2 weeks       Prognosis Prognosis for Safe Diet Advancement: Good      Swallow Study   General Date of Onset: 08/04/20 HPI: Patient is a 59 y.o. 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 Per chart review, his sister had reported she noticed bifacial weakness, significant worsened speech and significantly worsened gait. MRI brain revealed patchy small volume acute right MCA territory infarcts involving right frontoparietal region but no associated hemorrhage or mass effect, as well as underlying advanced cerebral atrophy and chronic microvascular ischemic disease, multiple remote lacunar infarcts about the basal ganglia and right cerebellum. Type of Study: Bedside Swallow Evaluation Previous Swallow Assessment: N/A Diet Prior to this Study: NPO Temperature Spikes Noted: No Respiratory Status: Room air History of Recent Intubation: No Behavior/Cognition: Alert;Cooperative;Pleasant mood;Impulsive Oral  Cavity Assessment: Within Functional Limits Oral Care Completed by SLP: Yes Oral Cavity - Dentition: Adequate natural dentition Vision: Functional for self-feeding Self-Feeding Abilities: Able to feed self Patient  Positioning: Upright in bed Baseline Vocal Quality: Low vocal intensity Volitional Cough: Cognitively unable to elicit Volitional Swallow: Unable to elicit    Oral/Motor/Sensory Function Facial Symmetry: Within Functional Limits Facial Strength: Within Functional Limits Lingual Symmetry: Abnormal symmetry right Lingual Strength: Within Functional Limits Mandible: Other (Comment) (open mouth posture at rest)   Ice Chips Ice chips: Impaired Oral Phase Impairments: Impaired mastication Other Comments: Patient only attempted very light ineffective mastication of small ice chip when SLP cued him.   Thin Liquid Thin Liquid: Impaired Presentation: Straw Oral Phase Impairments: Reduced labial seal;Reduced lingual movement/coordination Oral Phase Functional Implications: Prolonged oral transit;Oral holding;Right anterior spillage;Left anterior spillage Pharyngeal  Phase Impairments: Suspected delayed Swallow Other Comments: No overt s/s of aspiration or penetration    Nectar Thick     Honey Thick Honey Thick Liquid: Impaired Presentation: Cup;Self fed Oral Phase Impairments: Reduced labial seal;Poor awareness of bolus Oral Phase Functional Implications: Oral holding;Left anterior spillage;Right anterior spillage;Prolonged oral transit Other Comments: Patient initiated swallow but with oral residuals and oral holding with  honey thick liquids which would start to spill out of mouth; patient would not close mouth despite him frequently grabbing towel to catch spillage.   Puree Puree: Impaired Oral Phase Impairments: Poor awareness of bolus Oral Phase Functional Implications: Prolonged oral transit   Solid     Solid: Not tested      Angela Nevin, MA, CCC-SLP 08/04/20 2:20  PM

## 2020-08-04 NOTE — H&P (Addendum)
History and Physical  Adam [name] UJW:[id number] DOB: 11/21/1961 DOA: 08/03/2020  Referring physician: Dr. Jeanie Seweredding PCP: Patient, [name]  Outpatient Specialists: None Patient coming from: Home  Chief Complaint: Left arm numbness and drooling.  HPI: Adam [name] is a 59 y.o. male with medical history significant for essential hypertension, prior CVA in 2013, seen in [place], who presented [facility] from home with complaints of left arm numbness and drooling.  Last known well was at 8AM on 08/02/2020.  Associated with speech difficulty and abnormal gait.  Was not taking an aspirin or statin at home.  Not taking any medication.  He lives with his sister who noted the changes mentioned above and had him come to the ED for further evaluation.  Denies exposure to COVID-19 virus.  Received his first dose of COVID-19 vaccine in June 2021, not sure which one.  ED Course: Persistent left arm numbness, drooling appears improved on exam.  Vital signs and labs unremarkable.  CT head with findings as stated below.  TRH asked to admit.  CT head: 1. Subcentimeter age-indeterminate infarct within the right cerebellum. 2. Multiple chronic appearing lacunar infarcts within the bilateral basal ganglia. 3. Background mild generalized parenchymal atrophy with advanced chronic small vessel ischemic disease. CT head with contrast: 1. No definite proximal right M2 branch occlusion is identified. However, there is a paucity of visualized proximal M2 superior division right MCA branch vessels. If there is concern for an acute right MCA territory stroke, consider catheter based angiography for further evaluation. 2. Intracranial atherosclerotic disease with multifocal stenoses, most notably as follows. 3. Moderate stenosis within the distal M1 right middle cerebral artery. 4. High-grade focal stenosis within the distal A2/proximal A3 left anterior cerebral artery. 5. High-grade focal stenosis within the  P2 left PCA. 6. High-grade stenoses within the P4 right PCA  Review of Systems: Review of systems as noted in the HPI. All other systems reviewed and are negative.  Past medical history: Prior CVA in 2013, seen in [place].  History reviewed. No pertinent surgical history.  Social History:  reports that he has been smoking. He has never used smokeless tobacco. He reports previous alcohol use. He reports previous drug use.   No Known Allergies  Family history: Mother with history of breast and colon cancer.  Prior to Admission medications   Not on File  Was not taking any medications at home.  Physical Exam: BP 113/86 (BP Location: Right Arm)   Pulse 76   Temp 98.6 F (37 C) (Oral)   Resp 16   SpO2 99%   . General: 59 y.o. year-old male well developed well nourished in no acute distress.  Alert and oriented x3. . Cardiovascular: Regular rate and rhythm with no rubs or gallops.  No thyromegaly or JVD noted.  No lower extremity edema. 2/4 pulses in all 4 extremities. Marland Kitchen. Respiratory: Clear to auscultation with no wheezes or rales. Good inspiratory effort. . Abdomen: Soft nontender nondistended with normal bowel sounds x4 quadrants. . Muskuloskeletal: No cyanosis, clubbing or edema noted bilaterally . Neuro: CN II-XII intact, strength intact.  Persistent numbness affecting left upper extremity. . Skin: No ulcerative lesions noted or rashes . Psychiatry: Judgement and insight appear normal. Mood is appropriate for condition and setting          Labs on Admission:  Basic Metabolic Panel: Recent Labs  Lab 08/03/20 1427 08/03/20 1438  NA 142 145  K 3.6 3.7  CL 107 109  CO2 21*  --  GLUCOSE 87 82  BUN 29* 30*  CREATININE 1.10 0.90  CALCIUM 9.7  --    Liver Function Tests: Recent Labs  Lab 08/03/20 1427  AST 16  ALT 11  ALKPHOS 76  BILITOT 0.8  PROT 7.7  ALBUMIN 4.4   No results for input(s): LIPASE, AMYLASE in the last 168 hours. No results for input(s):  AMMONIA in the last 168 hours. CBC: Recent Labs  Lab 08/03/20 1427 08/03/20 1438  WBC 5.2  --   NEUTROABS 3.1  --   HGB 13.6 13.9  HCT 41.0 41.0  MCV 74.5*  --   PLT 207  --    Cardiac Enzymes: No results for input(s): CKTOTAL, CKMB, CKMBINDEX, TROPONINI in the last 168 hours.  BNP (last 3 results) No results for input(s): BNP in the last 8760 hours.  ProBNP (last 3 results) No results for input(s): PROBNP in the last 8760 hours.  CBG: No results for input(s): GLUCAP in the last 168 hours.  Radiological Exams on Admission: CT Angio Head W or Wo Contrast  Result Date: 08/03/2020 CLINICAL DATA:  Neuro deficit, acute, stroke suspected. Additional history provided: Numbness yesterday. Drooling. EXAM: CT ANGIOGRAPHY HEAD AND NECK TECHNIQUE: Multidetector CT imaging of the head and neck was performed using the standard protocol during bolus administration of intravenous contrast. Multiplanar CT image reconstructions and MIPs were obtained to evaluate the vascular anatomy. Carotid stenosis measurements (when applicable) are obtained utilizing NASCET criteria, using the distal internal carotid diameter as the denominator. CONTRAST:  28mL OMNIPAQUE IOHEXOL 350 MG/ML SOLN COMPARISON:  No pertinent prior exams are available for comparison. FINDINGS: CT HEAD FINDINGS Brain: Mild generalized parenchymal atrophy. There are multiple chronic appearing lacunar infarcts within the basal ganglia bilaterally. Background advanced ill-defined hypoattenuation within the cerebral white matter is nonspecific, but consistent with chronic small vessel ischemic disease. Subcentimeter age-indeterminate infarct within right cerebellar hemisphere (series 5, image 13). There is no acute intracranial hemorrhage. No demarcated cortical infarct is identified. No extra-axial fluid collection. No evidence of intracranial mass. No midline shift. Vascular: Reported below. Skull: Normal. Negative for fracture or focal lesion.  Sinuses: No significant paranasal sinus disease or mastoid effusion at the imaged levels. Orbits: No acute finding. Chronic fracture deformity of the right lamina papyracea. Review of the MIP images confirms the above findings CTA NECK FINDINGS Aortic arch: Common origin of the innominate and left common carotid arteries. The visualized aortic arch is unremarkable. No hemodynamically significant innominate or proximal subclavian artery stenosis. Right carotid system: CCA and ICA patent within the neck without stenosis. Left carotid system: CCA and ICA patent within the neck without stenosis. Vertebral arteries: Codominant and patent within the neck without stenosis Skeleton: No acute bony abnormality or aggressive osseous lesion. Cervical spondylosis with multilevel disc space narrowing, disc bulges, uncovertebral and facet hypertrophy. Other neck: No neck mass or cervical lymphadenopathy. Thyroid unremarkable. Upper chest: No consolidation within the imaged lung apices. Review of the MIP images confirms the above findings CTA HEAD FINDINGS Anterior circulation: The intracranial internal carotid arteries are patent. The M1 middle cerebral arteries are patent. There is moderate stenosis within the distal M1 right middle cerebral artery. No definite right M2 proximal branch occlusion is identified. However, there is a paucity of visualized proximal superior division right M2 MCA branches. The right anterior cerebral artery is patent without significant proximal stenosis. There is a high-grade focal stenosis within the distal A2/proximal A3 left anterior cerebral artery. No intracranial aneurysm is identified. Posterior circulation: The  intracranial vertebral arteries are patent. The basilar artery is patent. The posterior cerebral arteries are patent. There is a high-grade focal stenosis within the P2 left PCA. Additionally, there are multifocal high-grade stenoses within the P4 right PCA. Posterior communicating  arteries are present bilaterally. Venous sinuses: Within limitations of contrast timing, no convincing thrombus. Anatomic variants: None significant. Review of the MIP images confirms the above findings These results were called by telephone at the time of interpretation on 08/03/2020 at 8:28 pm to provider [facility] , who verbally acknowledged these results. IMPRESSION: CT head: 1. Subcentimeter age-indeterminate infarct within the right cerebellum. 2. Multiple chronic appearing lacunar infarcts within the bilateral basal ganglia. 3. Background mild generalized parenchymal atrophy with advanced chronic small vessel ischemic disease. CTA neck: The common carotid, internal carotid and vertebral arteries are patent within the neck without hemodynamically significant stenosis. CTA head: 1. No definite proximal right M2 branch occlusion is identified. However, there is a paucity of visualized proximal M2 superior division right MCA branch vessels. If there is concern for an acute right MCA territory stroke, consider catheter based angiography for further evaluation. 2. Intracranial atherosclerotic disease with multifocal stenoses, most notably as follows. 3. Moderate stenosis within the distal M1 right middle cerebral artery. 4. High-grade focal stenosis within the distal A2/proximal A3 left anterior cerebral artery. 5. High-grade focal stenosis within the P2 left PCA. 6. High-grade stenoses within the P4 right PCA. Electronically Signed   By: Jackey Loge DO   On: 08/03/2020 20:29   CT ANGIO NECK W OR WO CONTRAST  Result Date: 08/03/2020 CLINICAL DATA:  Neuro deficit, acute, stroke suspected. Additional history provided: Numbness yesterday. Drooling. EXAM: CT ANGIOGRAPHY HEAD AND NECK TECHNIQUE: Multidetector CT imaging of the head and neck was performed using the standard protocol during bolus administration of intravenous contrast. Multiplanar CT image reconstructions and MIPs were obtained to evaluate the  vascular anatomy. Carotid stenosis measurements (when applicable) are obtained utilizing NASCET criteria, using the distal internal carotid diameter as the denominator. CONTRAST:  75mL OMNIPAQUE IOHEXOL 350 MG/ML SOLN COMPARISON:  No pertinent prior exams are available for comparison. FINDINGS: CT HEAD FINDINGS Brain: Mild generalized parenchymal atrophy. There are multiple chronic appearing lacunar infarcts within the basal ganglia bilaterally. Background advanced ill-defined hypoattenuation within the cerebral white matter is nonspecific, but consistent with chronic small vessel ischemic disease. Subcentimeter age-indeterminate infarct within right cerebellar hemisphere (series 5, image 13). There is no acute intracranial hemorrhage. No demarcated cortical infarct is identified. No extra-axial fluid collection. No evidence of intracranial mass. No midline shift. Vascular: Reported below. Skull: Normal. Negative for fracture or focal lesion. Sinuses: No significant paranasal sinus disease or mastoid effusion at the imaged levels. Orbits: No acute finding. Chronic fracture deformity of the right lamina papyracea. Review of the MIP images confirms the above findings CTA NECK FINDINGS Aortic arch: Common origin of the innominate and left common carotid arteries. The visualized aortic arch is unremarkable. No hemodynamically significant innominate or proximal subclavian artery stenosis. Right carotid system: CCA and ICA patent within the neck without stenosis. Left carotid system: CCA and ICA patent within the neck without stenosis. Vertebral arteries: Codominant and patent within the neck without stenosis Skeleton: No acute bony abnormality or aggressive osseous lesion. Cervical spondylosis with multilevel disc space narrowing, disc bulges, uncovertebral and facet hypertrophy. Other neck: No neck mass or cervical lymphadenopathy. Thyroid unremarkable. Upper chest: No consolidation within the imaged lung apices. Review  of the MIP images confirms the above findings CTA  HEAD FINDINGS Anterior circulation: The intracranial internal carotid arteries are patent. The M1 middle cerebral arteries are patent. There is moderate stenosis within the distal M1 right middle cerebral artery. No definite right M2 proximal branch occlusion is identified. However, there is a paucity of visualized proximal superior division right M2 MCA branches. The right anterior cerebral artery is patent without significant proximal stenosis. There is a high-grade focal stenosis within the distal A2/proximal A3 left anterior cerebral artery. No intracranial aneurysm is identified. Posterior circulation: The intracranial vertebral arteries are patent. The basilar artery is patent. The posterior cerebral arteries are patent. There is a high-grade focal stenosis within the P2 left PCA. Additionally, there are multifocal high-grade stenoses within the P4 right PCA. Posterior communicating arteries are present bilaterally. Venous sinuses: Within limitations of contrast timing, no convincing thrombus. Anatomic variants: None significant. Review of the MIP images confirms the above findings These results were called by telephone at the time of interpretation on 08/03/2020 at 8:28 pm to provider [facility] , who verbally acknowledged these results. IMPRESSION: CT head: 1. Subcentimeter age-indeterminate infarct within the right cerebellum. 2. Multiple chronic appearing lacunar infarcts within the bilateral basal ganglia. 3. Background mild generalized parenchymal atrophy with advanced chronic small vessel ischemic disease. CTA neck: The common carotid, internal carotid and vertebral arteries are patent within the neck without hemodynamically significant stenosis. CTA head: 1. No definite proximal right M2 branch occlusion is identified. However, there is a paucity of visualized proximal M2 superior division right MCA branch vessels. If there is concern for an acute  right MCA territory stroke, consider catheter based angiography for further evaluation. 2. Intracranial atherosclerotic disease with multifocal stenoses, most notably as follows. 3. Moderate stenosis within the distal M1 right middle cerebral artery. 4. High-grade focal stenosis within the distal A2/proximal A3 left anterior cerebral artery. 5. High-grade focal stenosis within the P2 left PCA. 6. High-grade stenoses within the P4 right PCA. Electronically Signed   By: Jackey Loge DO   On: 08/03/2020 20:29    EKG: I independently viewed the EKG done and my findings are as followed: Normal sinus rhythm rate of 82.  Assessment/Plan Present on Admission: . Acute CVA (cerebrovascular accident) [facility])  Active Problems:   Acute CVA (cerebrovascular accident) (HCC)  Right cerebellar infarct, age indeterminate Prior history of CVA in 2013. Presented with left arm numbness and drooling Admitted for stroke work-up Obtain lipid panel, A1c, monitor on telemetry Obtain 2D echo with bubble study Start aspirin and high intensity statin, Lipitor 80 mg daily PT/OT/speech therapy evaluation Aspiration and fall precautions Neurochecks every 4 hours Permissive hypertension  New onset drooling suspect secondary to CVA Obtain MRI brain Obtain UDS N.p.o. until passes swallow evaluation by speech therapy  Low vitamin B12 Vitamin B12 IM injection 1000 mcg x1 dose Po vitamin B12 supplements 500 mcg daily   Essential hypertension Allow for permissive hypertension Treat SBP greater than 220 and DBP greater than 120 IV hydralazine as needed with parameters   DVT prophylaxis: Subcutaneous Lovenox daily  Code Status: Full code  Family Communication: Discussed with his sister via phone.  Disposition Plan: Admit to telemetry medical  Consults called: Neurology consulted by EDP  Admission status: Inpatient status   Status is: Inpatient    Dispo:  Patient From: Home  Planned Disposition: Home  with Health Care Svc  Expected discharge date: 08/06/20  Medically stable for discharge: No, ongoing work-up for CVA.        Darlin Drop MD  [facility] Pager [phone]  If 7PM-7AM, please contact night-coverage [email or web address] Password [facility]  08/04/2020, 12:27 AM

## 2020-08-04 NOTE — ED Notes (Signed)
Unable to get urine sample °

## 2020-08-04 NOTE — Progress Notes (Signed)
*  PRELIMINARY RESULTS* Echocardiogram 2D Echocardiogram with bubble study has been performed.  Adam [name] 08/04/2020, 3:32 PM

## 2020-08-04 NOTE — ED Notes (Signed)
Attempted to get pt's TSH. No success.

## 2020-08-04 NOTE — Evaluation (Signed)
Physical Therapy Evaluation Patient Details Name: Adam [name] MRN: [id number] DOB: 10-28-1961 Today's Date: 08/04/2020   History of Present Illness  59 y.o. male with medical history significant for essential hypertension, prior CVA in 2013, seen in [place], who presented to [facility] ED from home with complaints of left arm numbness and drooling. MRI revealed patchy small volume acute right MCA territory infarcts involving the right frontoparietal region and multiple remote lacunar infarcts about the bilateral basal ganglia and right cerebellum.    Clinical Impression  Pt admitted with above diagnosis. PTA pt lived with his sister, independent mobility and ADLs. On eval, he required min guard assist bed mobility, min guard assist transfers, and min assist ambulation 25' without AD. He presents with ataxic gait and shuffle pattern. Decreased facial muscle strength/tone noted. Mouth agape and difficulty managing secretions. Pt able to talk but prefers to respond with gestures (shaking his head, thumbs up, pointing, etc).  Pt will benefit from skilled PT to increase their independence and safety with mobility to allow discharge to the venue listed below.       Follow Up Recommendations CIR    Equipment Recommendations  Other (comment) (TBD)    Recommendations for Other Services Rehab consult     Precautions / Restrictions Precautions Precautions: Fall      Mobility  Bed Mobility Overal bed mobility: Needs Assistance Bed Mobility: Supine to Sit;Sit to Supine     Supine to sit: Min guard;HOB elevated Sit to supine: Min guard;HOB elevated   General bed mobility comments: +rail, min guard for safety/lines  Transfers Overall transfer level: Needs assistance Equipment used: None Transfers: Sit to/from [employer] Sit to Stand: Min guard Stand pivot transfers: Min guard       General transfer comment: increased time to stabilize  balance  Ambulation/Gait Ambulation/Gait assistance: Min assist Gait Distance (Feet): 25 Feet Assistive device: None Gait Pattern/deviations: Step-through pattern;Decreased stride length;Ataxic;Shuffle;Narrow base of support Gait velocity: decreased   General Gait Details: assist to stabilize balance. Short, shuffle steps with ataxic pattern and slightly flexed trunk. When pt asked if he can pick his feet up more, he began a march-like pattern with exagerated hip flexion.  Stairs            Wheelchair Mobility    Modified Rankin (Stroke Patients Only) Modified Rankin (Stroke Patients Only) Pre-Morbid Rankin Score: No symptoms Modified Rankin: Moderately severe disability     Balance Overall balance assessment: Needs assistance Sitting-balance support: Feet supported;No upper extremity supported Sitting balance-Leahy Scale: Good     Standing balance support: No upper extremity supported;During functional activity Standing balance-Leahy Scale: Fair                               Pertinent Vitals/Pain Pain Assessment: No/denies pain    Home Living Family/patient expects to be discharged to:: Private residence Living Arrangements: Other relatives (sister) Available Help at Discharge: Family Type of Home: House         Home Equipment: None      Prior Function                 Hand Dominance        Extremity/Trunk Assessment   Upper Extremity Assessment Upper Extremity Assessment: Defer to OT evaluation    Lower Extremity Assessment Lower Extremity Assessment: Generalized weakness (strength appears symmetrical)    Cervical / Trunk Assessment Cervical / Trunk Assessment: Normal  Communication  Communication: No difficulties  Cognition Arousal/Alertness: Awake/alert Behavior During Therapy: WFL for tasks assessed/performed Overall Cognitive Status: No family/caregiver present to determine baseline cognitive functioning                                  General Comments: Alert, oriented, and cooperative. Follows simple commands. Poor insight regarding current medical status.      General Comments General comments (skin integrity, edema, etc.): Mouth agape with difficulty managing saliva.    Exercises     Assessment/Plan    PT Assessment Patient needs continued PT services  PT Problem List Decreased strength;Decreased mobility;Decreased safety awareness;Decreased activity tolerance;Decreased cognition;Decreased balance;Decreased knowledge of use of DME       PT Treatment Interventions Therapeutic exercise;Gait training;Balance training;Neuromuscular re-education;Functional mobility training;Therapeutic activities;Patient/family education    PT Goals (Current goals can be found in the Care Plan section)  Acute Rehab PT Goals Patient Stated Goal: not stated PT Goal Formulation: With patient Time For Goal Achievement: 08/18/20 Potential to Achieve Goals: Good    Frequency Min 4X/week   Barriers to discharge        Co-evaluation               AM-PAC PT "6 Clicks" Mobility  Outcome Measure Help needed turning from your back to your side while in a flat bed without using bedrails?: None Help needed moving from lying on your back to sitting on the side of a flat bed without using bedrails?: None Help needed moving to and from a bed to a chair (including a wheelchair)?: A Little Help needed standing up from a chair using your arms (e.g., wheelchair or bedside chair)?: A Little Help needed to walk in hospital room?: A Little Help needed climbing 3-5 steps with a railing? : A Lot 6 Click Score: 19    End of Session Equipment Utilized During Treatment: Gait belt Activity Tolerance: Patient tolerated treatment well Patient left: in bed;with call bell/phone within reach Nurse Communication: Mobility status PT Visit Diagnosis: Unsteadiness on feet (R26.81);Difficulty in walking, not elsewhere  classified (R26.2)    Time: 6415-8309 PT Time Calculation (min) (ACUTE ONLY): 12 min   Charges:   PT Evaluation $PT Eval Moderate Complexity: 1 Mod          Aida Raider, PT  Office # [phone] Pager [phone]   Ilda Foil 08/04/2020, 2:16 PM

## 2020-08-05 ENCOUNTER — Inpatient Hospital Stay (HOSPITAL_COMMUNITY): Payer: Medicaid Other

## 2020-08-05 DIAGNOSIS — E538 Deficiency of other specified B group vitamins: Secondary | ICD-10-CM

## 2020-08-05 NOTE — Progress Notes (Addendum)
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  59 year old male with significant past medical history for hypertension, prior CVA 2013. She reported left arm numbness and drooling. She also had difficulty speaking and abnormal gait. On her initial physical examination blood pressure 113/86, heart rate 76, respiratory rate 16, oxygen saturation 99%. Her lungs were clear to auscultation bilaterally, heart S1-S2, present rhythmic, soft abdomen, no lower extremity edema. Patient was awake and alert, she had numbness at the left upper extremity. Strength preserved. Sodium 142, potassium 3.6, chloride 107, bicarb 21, glucose 87, BUN 29, creatinine 1.1, white count 5.2, hemoglobin 13.6, hematocrit 41.0, platelets 207. SARS COVID-19 negative.  Head CT with subcentimeter age indeterminate infarct within the right cerebellum. Multiple chronic appearing lacunar infarcts within bilateral basal ganglia. CT angiography of the neck no significant stenosis. CT angiography of the head no definite proximal right M2 branch occlusion identified. Intracranial atherosclerosis disease with multifocal stenosis. High-grade focal stenosis distal A2/proximal A3 left anterior cerebral artery. High-grade focal stenosis within the P2 left PCA, high-grade stenosis within P4 right PCA. EKG 82 bpm, normal axis, normal intervals, sinus rhythm, poor R wave progression, no ST segment or T wave changes, positive LVH.   Assessment & Plan:   Principal Problem:   Acute CVA (cerebrovascular accident) (HCC) Active Problems:   HTN (hypertension)   B12 deficiency   Protein-calorie malnutrition, moderate (HCC)    1. Acute right cerebellum ischemic infarct. Patient with persistent speech difficulty. Brain MRI  patchy small volume acute right MCA territory infarcts involving right frontoparietal region. Likely embolic phenomena. Lipid profile with LDL 80 and HDL 33, total cholesterol 824, triglycerides 49.  Echocardiogram with preserved LV systolic function, no embolic phenomena and negative bubble study. Persistent dysphagia.   Continue with aspiration precautions and dysphagia 1 diet, dual antiplatelet therapy with asa and clopidogrel, blood pressure control with HCTZ and will add statin therapy (atorvastatin 80 mg).   Follow with recommendations from neurology    2. B12 deficiency/ moderate to severe calorie protein malnutrition. B 12 level 90, folate 181. On B12, thiamine and multivitamins supplementation, follow nutrition recommendations  3. Uncontrolled HTN. Blood pressure this am 168/76 mmHg, continue with HCTZ for blood pressure control, goal systolic less than 180 mmHg in the setting of acute CVA.     Status is: Inpatient  Remains inpatient appropriate because:Inpatient level of care appropriate due to severity of illness   Dispo:  Patient From: Home  Planned Disposition: Home with Health Care Svc  Expected discharge date: 08/06/20  Medically stable for discharge: No    DVT prophylaxis: Enoxaparin   Code Status:   full  Family Communication:  No family at the bedside       Consultants:   Neurology     Subjective: Patient continue to have paresthesias at his left upper extremity, continue to have difficulty speaking and swallowing, no chest pain or dyspnea.,   Objective: Vitals:   08/04/20 1215 08/04/20 1300 08/04/20 1345 08/05/20 0529  BP: (!) 175/105 (!) 167/101 (!) 171/107 (!) 168/76  Pulse: (!) 57 (!) 57 (!) 54 64  Resp: 18 16 19 16   Temp:    98 F (36.7 C)  TempSrc:    Oral  SpO2: 100% 100% 100% 97%    Intake/Output Summary (Last 24 hours)  at 08/05/2020 1356 Last data filed at 08/04/2020 1431 Gross per 24 hour  Intake 332.22 ml  Output --  Net 332.22  ml   There were no vitals filed for this visit.  Examination:   General: Not in pain or dyspnea, deconditioned  Neurology: Awake and alert, non focal  E ENT: no pallor, no icterus, oral mucosa moist Cardiovascular: No JVD. S1-S2 present, rhythmic, no gallops, rubs, or murmurs. No lower extremity edema. Pulmonary: vesicular breath sounds bilaterally, adequate air movement, no wheezing, rhonchi or rales. Gastrointestinal. Abdomen soft and non tender Skin. No rashes Musculoskeletal: no joint deformities     Data Reviewed: I have personally reviewed following labs and imaging studies  CBC: Recent Labs  Lab 08/03/20 1427 08/03/20 1438  WBC 5.2  --   NEUTROABS 3.1  --   HGB 13.6 13.9  HCT 41.0 41.0  MCV 74.5*  --   PLT 207  --    Basic Metabolic Panel: Recent Labs  Lab 08/03/20 1427 08/03/20 1438  NA 142 145  K 3.6 3.7  CL 107 109  CO2 21*  --   GLUCOSE 87 82  BUN 29* 30*  CREATININE 1.10 0.90  CALCIUM 9.7  --    GFR: CrCl cannot be calculated (Unknown ideal weight.). Liver Function Tests: Recent Labs  Lab 08/03/20 1427  AST 16  ALT 11  ALKPHOS 76  BILITOT 0.8  PROT 7.7  ALBUMIN 4.4   No results for input(s): LIPASE, AMYLASE in the last 168 hours. Recent Labs  Lab 08/03/20 2148  AMMONIA 37*   Coagulation Profile: Recent Labs  Lab 08/03/20 1427  INR 1.1   Cardiac Enzymes: No results for input(s): CKTOTAL, CKMB, CKMBINDEX, TROPONINI in the last 168 hours. BNP (last 3 results) No results for input(s): PROBNP in the last 8760 hours. HbA1C: No results for input(s): HGBA1C in the last 72 hours. CBG: No results for input(s): GLUCAP in the last 168 hours. Lipid Profile: Recent Labs    08/04/20 1142  CHOL 123  HDL 33*  LDLCALC 80  TRIG 49  CHOLHDL 3.7   Thyroid Function Tests: No results for input(s): TSH, T4TOTAL, FREET4, T3FREE, THYROIDAB in the last 72 hours. Anemia Panel: Recent Labs    08/03/20 2355  VITAMINB12 90*  FOLATE 18.1       Radiology Studies: I have reviewed all of the imaging during this hospital visit personally     Scheduled Meds: . aspirin  81 mg Oral Daily  . atorvastatin  80 mg Oral Daily  . clopidogrel  75 mg Oral Daily  . enoxaparin (LOVENOX) injection  40 mg Subcutaneous Daily  . hydrochlorothiazide  25 mg Oral Daily  . multivitamins with iron  1 tablet Oral Daily  . thiamine  100 mg Oral Daily  . vitamin B-12  1,000 mcg Oral Daily   Continuous Infusions:   LOS: 1 day        Genesia Caslin Annett Gula, MD \

## 2020-08-05 NOTE — ED Notes (Signed)
Pt given breakfast.

## 2020-08-05 NOTE — Progress Notes (Signed)
STROKE TEAM PROGRESS NOTE    INTERVAL HISTORY His RN is at the bedside.  Patient states is doing little bit better.  Is neurologically unchanged.  Vital signs stable. 2D echocardiogram is unremarkable.  Urine drug screen is yet pending  OBJECTIVE Vitals:   08/04/20 1215 08/04/20 1300 08/04/20 1345 08/05/20 0529  BP: (!) 175/105 (!) 167/101 (!) 171/107 (!) 168/76  Pulse: (!) 57 (!) 57 (!) 54 64  Resp: 18 16 19 16   Temp:    98 F (36.7 C)  TempSrc:    Oral  SpO2: 100% 100% 100% 97%    CBC:  Recent Labs  Lab 08/03/20 1427 08/03/20 1438  WBC 5.2  --   NEUTROABS 3.1  --   HGB 13.6 13.9  HCT 41.0 41.0  MCV 74.5*  --   PLT 207  --     Basic Metabolic Panel:  Recent Labs  Lab 08/03/20 1427 08/03/20 1438  NA 142 145  K 3.6 3.7  CL 107 109  CO2 21*  --   GLUCOSE 87 82  BUN 29* 30*  CREATININE 1.10 0.90  CALCIUM 9.7  --     Lipid Panel:     Component Value Date/Time   CHOL 123 08/04/2020 1142   TRIG 49 08/04/2020 1142   HDL 33 (L) 08/04/2020 1142   CHOLHDL 3.7 08/04/2020 1142   VLDL 10 08/04/2020 1142   LDLCALC 80 08/04/2020 1142   HgbA1c: No results found for: HGBA1C Urine Drug Screen: No results found for: LABOPIA, COCAINSCRNUR, LABBENZ, AMPHETMU, THCU, LABBARB  Alcohol Level     Component Value Date/Time   ETH <10 08/03/2020 2354    IMAGING  CT HEAD CT Angio Head W or Wo Contrast CT ANGIO NECK W OR WO CONTRAST 08/03/2020 IMPRESSION:   CT head:  1. Subcentimeter age-indeterminate infarct within the right cerebellum.  2. Multiple chronic appearing lacunar infarcts within the bilateral basal ganglia.  3. Background mild generalized parenchymal atrophy with advanced chronic small vessel ischemic disease.   CTA neck:  The common carotid, internal carotid and vertebral arteries are patent within the neck without hemodynamically significant stenosis.   CTA head:  1. No definite proximal right M2 branch occlusion is identified. However, there is a  paucity of visualized proximal M2 superior division right MCA branch vessels. If there is concern for an acute right MCA territory stroke, consider catheter based angiography for further evaluation.  2. Intracranial atherosclerotic disease with multifocal stenoses, most notably as follows.  3. Moderate stenosis within the distal M1 right middle cerebral artery.  4. High-grade focal stenosis within the distal A2/proximal A3 left anterior cerebral artery.  5. High-grade focal stenosis within the P2 left PCA.  6. High-grade stenoses within the P4 right PCA.   MR Brain W and Wo Contrast 08/04/2020 IMPRESSION:  1. Patchy small volume acute right MCA territory infarcts involving the right frontoparietal region as above. No associated hemorrhage or mass effect.  2. Underlying advanced cerebral atrophy and chronic microvascular ischemic disease for age, with multiple remote lacunar infarcts about the bilateral basal ganglia and right cerebellum.   Transthoracic Echocardiogram  Normal ejection fraction 60 to 65%.  No cardiac source of embolism  ECG - SR rate 82 BPM. (See cardiology reading for complete details)  PHYSICAL EXAM Blood pressure (!) 168/76, pulse 64, temperature 98 F (36.7 C), temperature source Oral, resp. rate 16, SpO2 97 %. Frail unkempt malnourished looking African-American male not in distress. . Afebrile. Head is nontraumatic. Neck  is supple without bruit.    Cardiac exam no murmur or gallop. Lungs are clear to auscultation. Distal pulses are well felt. Neurological Exam  Awake alert oriented to place and person    Diminished attention, registration and recall.  Mildly dysarthric speech.  No aphasia.  Follows commands well.  Extraocular movements are full range without nystagmus.  Blinks to threat bilaterally.  Bifacial weakness right greater than left lower half.  Tongue deviates to the right on protrusion.  Motor system exam shows no upper or lower extremity drift but mild proximal  weakness bilaterally right greater than left.  Mild ankle dorsiflexor weakness in the right.  Reflexes are brisk bilaterally.  Plantars were both equivocal.  Patchy sensory loss in the left chest and medial forearm but not in a stroke distribution.  ASSESSMENT/PLAN Adam [name] is a 59 y.o. male with history of hypertension, prior stroke (residual right-sided weakness and dysarthria), active smoking (reports 1 cigarette/day) presenting with acute onset drooling, patchy left arm and torso sensory changes, headache, cough, bifacial weakness, significantly worsened speech and gait with recent weight loss. He did not receive IV t-PA due to late presentation (>4.5 hours from time of onset).    Stroke: acute right MCA territory infarcts involving the right frontoparietal region with subcentimeter age-indeterminate infarct within the right cerebellum - embolic - source unknown.  Versus intracranial atherosclerosis   resultant increased drooling and mild left-sided weakness   code Stroke CT Head - not ordered  CT head - Subcentimeter age-indeterminate infarct within the right cerebellum. Multiple chronic appearing lacunar infarcts within the bilateral basal ganglia.   MRI head - Patchy small volume acute right MCA territory infarcts involving the right frontoparietal region as above. Underlying advanced cerebral atrophy and chronic microvascular ischemic disease for age, with multiple remote lacunar infarcts about the bilateral basal ganglia and right cerebellum.   MRA head - not ordered  CTA Head - High-grade focal stenosis within the distal A2/proximal A3 left anterior cerebral artery. High-grade focal stenosis within the P2 left PCA. High-grade stenoses within the P4 right PCA.   CTA Neck - No hemodynamically significant stenosis.   CT Perfusion - not ordered  Carotid Doppler - CTA neck ordered - carotid dopplers not indicated.  2D Echo -normal ejection fraction 60 to 65%.  Sars Corona  Virus 2 - negative  LDL -80 mg percent  HgbA1c - pending  UDS - pending  VTE prophylaxis - Lovenox Diet  Diet Order            DIET - DYS 1 Room service appropriate? No; Fluid consistency: Honey Thick  Diet effective now                 No antithrombotic prior to admission, now on aspirin 81 mg daily  Patient counseled to be compliant with his antithrombotic medications  Ongoing aggressive stroke risk factor management  Therapy recommendations:  pending  Disposition:  Pending  Hypertension  Home BP meds: none  Current BP meds: apresoline prn  Stable . Permissive hypertension (OK if < 220/120) but gradually normalize in 5-7 days  . Long-term BP goal normotensive  Hyperlipidemia  Home Lipid lowering medication: none   LDL 80 mg percent, goal < 70  Current lipid lowering medication: Lipitor 80 mg daily  Continue statin at discharge  Other Stroke Risk Factors  Cigarette smoker - advised to stop smoking  Previous ETOH use  Obesity, There is no height or weight on file to calculate BMI., recommend  weight loss, diet and exercise as appropriate   Family hx stroke - not on file  Hx stroke/TIA  Previous substance abuse  Other Active Problems  Code status - Full code  NPO  Mild bradycardia - 50's  Hospital day # 1 Patient has presented with embolic right MCA branch infarct etiology unclear as to intracranial sclerosis or central cardiac or aortic arch source.  Recommend continue ongoing work-up check urine drug screen and hemoglobin A1c.  Continue aspirin Plavix for 3 weeks followed by aspirin alone.  Replace low vitamin B12 as per primary team.  May need TEE/loop recorder later.  Will orderTCD bubble study for PFO.  Discussed with Dr. Ella Jubilee. greater than 50% time during this 25-minute visit were spent in counseling and coordination of care about embolic stroke and discussion about evaluation treatment plan and answered questions. Delia Heady, MD To  contact Stroke Continuity provider, please refer to [email or web address]. After hours, contact General Neurology

## 2020-08-05 NOTE — ED Notes (Signed)
Pt asked about providing a urine sample. 

## 2020-08-05 NOTE — ED Notes (Signed)
Son, Marcha Solders would like an update [phone]

## 2020-08-05 NOTE — Progress Notes (Signed)
Physical Therapy Treatment Patient Details Name: Adam [name] MRN: [id number] DOB: [birth year] Today's Date: 08/05/2020    History of Present Illness 59 y.o. male with medical history significant for essential hypertension, prior CVA in 2013, seen in [place], who presented to [facility] ED from home with complaints of left arm numbness and drooling. MRI revealed patchy small volume acute right MCA territory infarcts involving the right frontoparietal region and multiple remote lacunar infarcts about the bilateral basal ganglia and right cerebellum.    PT Comments    Pt progressing towards goals. Continues to present with very short shuffled steps and increased instability. Pt also with inversion noted on R foot this session. Continues to demonstrate cognitive deficits, however, unsure of baseline. Feel current recommendations appropriate. Will continue to follow acutely to maximize functional mobility independence and safety.     Follow Up Recommendations  CIR     Equipment Recommendations  Other (comment) (TBD)    Recommendations for Other Services Rehab consult     Precautions / Restrictions Precautions Precautions: Fall Restrictions Weight Bearing Restrictions: No    Mobility  Bed Mobility Overal bed mobility: Needs Assistance Bed Mobility: Supine to Sit     Supine to sit: Min guard     General bed mobility comments: Min guard for safety to come to sitting. Increased time required.   Transfers Overall transfer level: Needs assistance Equipment used: None Transfers: Sit to/from Stand Sit to Stand: Min assist         General transfer comment: Min A for steadying assist to stand.   Ambulation/Gait Ambulation/Gait assistance: Min assist Gait Distance (Feet): 75 Feet Assistive device: None Gait Pattern/deviations: Step-through pattern;Decreased stride length;Ataxic;Shuffle;Narrow base of support Gait velocity: decreased   General Gait Details: Shuffle type gait with  notable R foot inversion. Pt reports he has a callous on R foot. Pt given cues for increased step height. Required min A for steadying assist.    Stairs             Wheelchair Mobility    Modified Rankin (Stroke Patients Only) Modified Rankin (Stroke Patients Only) Pre-Morbid Rankin Score: No symptoms Modified Rankin: Moderately severe disability     Balance Overall balance assessment: Needs assistance Sitting-balance support: Feet supported;No upper extremity supported Sitting balance-Leahy Scale: Good     Standing balance support: No upper extremity supported;During functional activity Standing balance-Leahy Scale: Fair                              Cognition Arousal/Alertness: Awake/alert Behavior During Therapy: WFL for tasks assessed/performed Overall Cognitive Status: No family/caregiver present to determine baseline cognitive functioning                                 General Comments: Slow processing, but able to follow simple commands      Exercises      General Comments        Pertinent Vitals/Pain Pain Assessment: No/denies pain    Home Living                      Prior Function            PT Goals (current goals can now be found in the care plan section) Acute Rehab PT Goals Patient Stated Goal: to be able to play the piano PT Goal Formulation: With patient Time For Goal  Achievement: 08/18/20 Potential to Achieve Goals: Good Progress towards PT goals: Progressing toward goals    Frequency    Min 4X/week      PT Plan Current plan remains appropriate    Co-evaluation              AM-PAC PT "6 Clicks" Mobility   Outcome Measure  Help needed turning from your back to your side while in a flat bed without using bedrails?: None Help needed moving from lying on your back to sitting on the side of a flat bed without using bedrails?: A Little Help needed moving to and from a bed to a chair  (including a wheelchair)?: A Little Help needed standing up from a chair using your arms (e.g., wheelchair or bedside chair)?: A Little Help needed to walk in hospital room?: A Little Help needed climbing 3-5 steps with a railing? : A Lot 6 Click Score: 18    End of Session Equipment Utilized During Treatment: Gait belt Activity Tolerance: Patient tolerated treatment well Patient left: in bed;with call bell/phone within reach (sitting EOB in ED ) Nurse Communication: Mobility status PT Visit Diagnosis: Unsteadiness on feet (R26.81);Difficulty in walking, not elsewhere classified (R26.2)     Time: 1761-6073 PT Time Calculation (min) (ACUTE ONLY): 13 min  Charges:  $Gait Training: 8-22 mins                     Cindee Salt, DPT  Acute Rehabilitation Services  Pager: [phone] Office: [phone]    Lehman Prom 08/05/2020, 5:01 PM

## 2020-08-05 NOTE — Progress Notes (Signed)
Modified Barium Swallow Progress Note  Patient Details  Name: Doctor Sheahan MRN: [id number] Date of Birth: October 02, 1961  Today's Date: 08/05/2020  Modified Barium Swallow completed.  Full report located under Chart Review in the Imaging Section.  Brief recommendations include the following:  Clinical Impression  Pt presents with oropharyngeal dysphagia characterized by reduced labial seal, impaired bolus propulsion, impaired bolus cohesion, reduced lingual retraction, reduced anterior laryngeal movement, and a pharyngeal delay. He demonstrated anterior spillage, oral holding, lingual pumping, piecemeal deglutition, premature spillage, vallecular residue, pyriform sinus residue, penetration (PAS 3) of thin liquids and silent aspiration (PAS 8) of nectar thick liquids secondary to the pharyngeal delay. Pt's most significant impairments are posterior propulsion and oral holding with oral holding sometimes being over a minute in length. Pt required verbal and tactile cues to initiate a swallow and anterior spillage was still noted intermittently when pt attempted to coordinate the movements necessary for swallowing. Pt's swallow was most timely with  puree solids and honey thick liquids via cup. Amount of pharyngeal residue increased with bolus sizes but was improved with subsequent liquid washses. 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 Pt is safest with his current diet of dysphagia 1 (puree) solids and honey thick liquids. However, considering the level of support required for swallowing of the limited boluses given during the study, it is anticipated that he may not be able to maintain adequate nutrition or hydration p.o. and that supplemental nonoral nutrition may ultimately be needed in the future. SLP will follow for dysphagia treatment.    Swallow Evaluation Recommendations       SLP Diet Recommendations: Honey thick liquids;Dysphagia 1 (Puree)  solids   Liquid Administration via: Cup;Straw   Medication Administration: Crushed with puree   Supervision: Staff to assist with self feeding;Full supervision/cueing for compensatory strategies   Compensations: Slow rate;Small sips/bites;Minimize environmental distractions;Follow solids with liquid;Other (Comment) (tactile stimulation to mouth and throat to swallow)   Postural Changes: Remain semi-upright after after feeds/meals (Comment)   Oral Care Recommendations: Oral care BID       Anup Brigham I. Vear Clock, MS, CCC-SLP Acute Rehabilitation Services Office number [phone] Pager [phone]  Scheryl Marten 08/05/2020,10:10 AM

## 2020-08-06 ENCOUNTER — Inpatient Hospital Stay (HOSPITAL_COMMUNITY): Payer: Medicaid Other

## 2020-08-06 DIAGNOSIS — I639 Cerebral infarction, unspecified: Secondary | ICD-10-CM

## 2020-08-06 DIAGNOSIS — E43 Unspecified severe protein-calorie malnutrition: Secondary | ICD-10-CM

## 2020-08-06 LAB — HEMOGLOBIN A1C
Hgb A1c MFr Bld: 5.7 % — ABNORMAL HIGH (ref 4.8–5.6)
Mean Plasma Glucose: 116.89 mg/dL

## 2020-08-06 MED ORDER — ASPIRIN EC 81 MG PO TBEC
81.0000 mg | DELAYED_RELEASE_TABLET | Freq: Every day | ORAL | Status: DC
  Administered 2020-08-07 – 2020-08-13 (×6): 81 mg via ORAL
  Filled 2020-08-06 (×7): qty 1

## 2020-08-06 MED ORDER — LISINOPRIL 10 MG PO TABS
10.0000 mg | ORAL_TABLET | Freq: Every day | ORAL | Status: DC
  Administered 2020-08-07 – 2020-08-10 (×3): 10 mg via ORAL
  Filled 2020-08-06 (×3): qty 1

## 2020-08-06 MED ORDER — ASPIRIN 300 MG RE SUPP
300.0000 mg | Freq: Every day | RECTAL | Status: DC
  Administered 2020-08-06: 300 mg via RECTAL
  Filled 2020-08-06 (×9): qty 1

## 2020-08-06 NOTE — Progress Notes (Signed)
°  Speech Language Pathology Treatment: Dysphagia  Patient Details Name: Adam [name] MRN: [id number] DOB: 07/11/1961 Today's Date: 08/06/2020 Time: 1015-1100 SLP Time Calculation (min) (ACUTE ONLY): 28 min  Assessment / Plan / Recommendation Clinical Impression  Pt found to have copious volume orange juice in oral cavity during breakfast consumption as well as puree solids throughout session. On command he is able to perform labial and lingual movements without difficulty however apraxic like impairments with oral manipulation and transition although is a right brained stroke. Po's are held in oral cavity and he needed frequent cues to move tongue, press tongue to hard palate, extend head, utilize spoon in attempts to propel bolus clearing mouth 90% in only 20% of opportunities. Therapist set up suction which he utilized to remove oral residue. Pharyngeal swallow initiated in approximately 40% of attempts. Delayed cough x 2 with likely laryngeal vestibule penetration from subjective judgement. Concern for malnourishment and poor intake and alternative means nutrition recommended in addition to po's. Dr. 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    HPI HPI: Patient is a 59 y.o. 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 Per chart review, his sister had reported she noticed bifacial weakness, significant worsened speech and significantly worsened gait. MRI brain revealed patchy small volume acute right MCA territory infarcts involving right frontoparietal region but no associated hemorrhage or mass effect, as well as underlying advanced cerebral atrophy and chronic microvascular ischemic disease, multiple remote lacunar infarcts about the basal ganglia and right cerebellum.      SLP Plan  Continue with  current plan of care       Recommendations  Diet recommendations: Dysphagia 1 (puree);Honey-thick liquid Liquids provided via: Teaspoon;Cup Medication Administration: Crushed with puree Supervision: Patient able to self feed Compensations: Minimize environmental distractions;Slow rate;Small sips/bites;Lingual sweep for clearance of pocketing;Monitor for anterior loss Postural Changes and/or Swallow Maneuvers: Seated upright 90 degrees (head extension)      PMSV Supervision: Intermittent         Oral Care Recommendations: Oral care BID Follow up Recommendations: Inpatient Rehab SLP Visit Diagnosis: Dysphagia, oropharyngeal phase (R13.12) Plan: Continue with current plan of care                       Adam [name] 08/06/2020, 11:43 AM   Adam [name] Adam [name].Ed Nurse, children's [phone] Office [phone]

## 2020-08-06 NOTE — Evaluation (Signed)
Occupational Therapy Evaluation Patient Details Name: Adam [name] MRN: [id number] DOB: 05-Sep-1961 Today's Date: 08/06/2020    History of Present Illness 59 y.o. male with medical history significant for essential hypertension, prior CVA in 2013, seen in [place], who presented to [facility] ED from home with complaints of left arm numbness and drooling. MRI revealed patchy small volume acute right MCA territory infarcts involving the right frontoparietal region and multiple remote lacunar infarcts about the bilateral basal ganglia and right cerebellum.   Clinical Impression   PTA, pt was living with his sister and was performing BADLs and light IADLs; enjoys watching basketball. Pt currently performing ADLs and functional mobility at Supervision-Min guard A level. Pt presenting with decreased balance and cognition; unsure of baseline cognition. Pt requiring increased time for processing. Pt with increased saliva and drooling. Pt would benefit from further acute OT to facilitate safe dc. Recommend dc to home with HHOT for further OT to optimize safety, independence with ADLs, and return to PLOF.     Follow Up Recommendations  Home health OT;Supervision/Assistance - 24 hour    Equipment Recommendations  Tub/shower seat    Recommendations for Other Services PT consult;Speech consult     Precautions / Restrictions Precautions Precautions: Fall      Mobility Bed Mobility Overal bed mobility: Needs Assistance             General bed mobility comments: Sitting at EOB.  Transfers Overall transfer level: Needs assistance Equipment used: None Transfers: Sit to/from Stand Sit to Stand: Supervision         General transfer comment: Supervision for safety. No physical A needed    Balance Overall balance assessment: Needs assistance Sitting-balance support: No upper extremity supported;Feet supported Sitting balance-Leahy Scale: Good     Standing balance support: No upper extremity  supported;During functional activity Standing balance-Leahy Scale: Fair                             ADL either performed or assessed with clinical judgement   ADL Overall ADL's : Needs assistance/impaired Eating/Feeding: Set up;Sitting   Grooming: Oral care;Supervision/safety;Set up;Standing                               Functional mobility during ADLs: Supervision/safety General ADL Comments:  Pt performing ADLs and functional mobility at Supervision level. Pt presenting with decreased balance and cogntion. Requiring increased time throughout for processing and motor movements. However, feel pt is close to his baseline.      Vision Baseline Vision/History: No visual deficits Patient Visual Report: No change from baseline       Perception     Praxis      Pertinent Vitals/Pain Pain Assessment: No/denies pain     Hand Dominance Right   Extremity/Trunk Assessment Upper Extremity Assessment Upper Extremity Assessment: RUE deficits/detail;LUE deficits/detail RUE Deficits / Details: Strength WFL.  Decreased coorindation during testing. Able to perform BADLs LUE Deficits / Details: Strength WFL.  Decreased coorindation during testing. Able to perform BADLs. Decreased FM coorindation compared to RUE    Lower Extremity Assessment Lower Extremity Assessment: Defer to PT evaluation   Cervical / Trunk Assessment Cervical / Trunk Assessment: Normal   Communication Communication Communication: No difficulties   Cognition Arousal/Alertness: Awake/alert Behavior During Therapy: WFL for tasks assessed/performed Overall Cognitive Status: Impaired/Different from baseline Area of Impairment: Problem solving;Awareness  Awareness: Emergent Problem Solving: Slow processing;Requires verbal cues General Comments: Pt presenting with slow processing requiring increased time throughout. Following commands and performing BADLs  without cues.  Pt able to navigate back to his room.   General Comments  Increased saliva with deceased mouth closure    Exercises     Shoulder Instructions      Home Living Family/patient expects to be discharged to:: Private residence Living Arrangements: Other relatives (Sister) Available Help at Discharge: Family;Available PRN/intermittently Type of Home: Apartment Home Access: Stairs to enter Entrance Stairs-Number of Steps: 6 Entrance Stairs-Rails: Left Home Layout: One level     Bathroom Shower/Tub: Chief Strategy Officer: Standard     Home Equipment: None          Prior Functioning/Environment Level of Independence: Independent        Comments: ADLs and IADLs. Not driving. Enjoys hangout at the basketball court        OT Problem List: Impaired balance (sitting and/or standing);Decreased knowledge of use of DME or AE;Decreased knowledge of precautions      OT Treatment/Interventions: Self-care/ADL training;Therapeutic exercise;Energy conservation;DME and/or AE instruction;Therapeutic activities;Patient/family education    OT Goals(Current goals can be found in the care plan section) Acute Rehab OT Goals Patient Stated Goal: "Talk" OT Goal Formulation: With patient Time For Goal Achievement: 08/20/20 Potential to Achieve Goals: Good  OT Frequency: Min 2X/week   Barriers to D/C: Decreased caregiver support  Unsure of carefiver support; pt lives with his sister and she works       Estate manager/land agent OT "6 Clicks" Daily Activity     Outcome Measure Help from another person eating meals?: None Help from another person taking care of personal grooming?: A Little Help from another person toileting, which includes using toliet, bedpan, or urinal?: A Little Help from another person bathing (including washing, rinsing, drying)?: A Little Help from another person to put on and taking off regular upper body clothing?: A  Little Help from another person to put on and taking off regular lower body clothing?: A Little 6 Click Score: 19   End of Session Nurse Communication: Mobility status;Other (comment) (Pt requesting grits to eat)  Activity Tolerance: Patient tolerated treatment well Patient left: in chair;with call bell/phone within reach;with chair alarm set  OT Visit Diagnosis: Unsteadiness on feet (R26.81);Other abnormalities of gait and mobility (R26.89);Muscle weakness (generalized) (M62.81)                Time: 4196-2229 OT Time Calculation (min): 20 min Charges:  OT General Charges $OT Visit: 1 Visit OT Evaluation $OT Eval Moderate Complexity: 1 Mod  Toren Tucholski MSOT, OTR/L Acute Rehab Pager: [phone] Office: [phone]  Theodoro Grist Corgan Mormile 08/06/2020, 8:41 AM

## 2020-08-06 NOTE — Progress Notes (Signed)
Inpatient Rehab Admissions Coordinator Note:   Per therapy recommendations, pt was screened for CIR candidacy by Estill Dooms, PT, DPT.  At this time we are recommending a CIR consult and I will place an order per our protocol.  Please contact me with questions.   Estill Dooms, PT, DPT [phone] 08/06/20 10:49 AM

## 2020-08-06 NOTE — Progress Notes (Signed)
PROGRESS NOTE    Macedonio Scallon  VHQ:[id number] DOB: [birth year] DOA: 08/03/2020 PCP: Patient, [name]    Brief Narrative:  Patient admitted to the hospital with a working diagnosis of right cerebellar infarct/ right frontoparietal region (MCA)/ likely embolic.  59 year old male with significant past medical history for hypertension, prior CVA 2013. She reported left arm numbness and drooling. She also had difficulty speaking and abnormal gait. On her initial physical examination blood pressure 113/86, heart rate 76, respiratory rate 16, oxygen saturation 99%. Her lungs were clear to auscultation bilaterally, heart S1-S2, present rhythmic, soft abdomen, no lower extremity edema. Patient was awake and alert, she had numbness at the left upper extremity. Strength preserved. Sodium 142, potassium 3.6, chloride 107, bicarb 21, glucose 87, BUN 29, creatinine 1.1, white count 5.2, hemoglobin 13.6, hematocrit 41.0, platelets207. SARS COVID-19 negative.  Head CT with subcentimeterageindeterminate infarct within the right cerebellum. Multiple chronic appearing lacunar infarcts within bilateral basal ganglia. CT angiography of the neck no significant stenosis. CT angiography of the head no definite proximal right M2 branch occlusion identified.Intracranial atherosclerosis disease with multifocal stenosis. High-grade focal stenosis distal A2/proximal A3 left anterior cerebral artery. High-grade focal stenosis within the P2 left PCA, high-grade stenosis within P4 right PCA. EKG 82 bpm, normal axis, normal intervals, sinus rhythm, poor R wave progression, no ST segment or T wave changes, positive LVH.  Patient has been placed on clopidogrel and aspiring, continue to have swallow dysfunction and difficulty with mobility.    Brain MRI patchy small volume acute right MCA territory infarcts involving right frontoparietal region. Likely embolic phenomena. Lipid profile with LDL 80 and HDL 33, total  cholesterol 123, triglycerides 49.  Echocardiogram with preserved LV systolic function, no embolic phenomena and negative bubble study. Persistent dysphagia  PT and OT have recommended inpatient rehab.     Assessment & Plan:   Principal Problem:   Acute CVA (cerebrovascular accident) (HCC) Active Problems:   HTN (hypertension)   B12 deficiency   Protein-calorie malnutrition, severe (HCC)   1. Acute right cerebellum ischemic infarct/ right MCA territory infarcts involving right frontoparietal region. Likely embolic phenomena. Continue to have speech difficulty and decreased mobility.  On aspiration precautions and dysphagia 1 diet. Per neurology recommendations will continue with dual antiplatelet therapy with asa and clopidogrel for 3 weeks then continue with aspirin alone.  Further embolic work up as outpatient (loop and TEE). Continue with high dose high potency statin with atorvastatin.   Continue blood pressure control, speech and PT/OT therapy, patient will need inpatient rehab.    2. B12 deficiency/ moderate to severe calorie protein malnutrition. B 12 level 90, folate 181. Continue with B12, thiamine and multivitamins supplementation. Nutritional supplements.   3. Uncontrolled HTN. Uncontrolled hypertension, blood pressure 168/120 and 156/117, patient on HCTZ, will add lisinopril 10 mg and continue blood pressure monitoring.     Status is: Inpatient  Remains inpatient appropriate because:Inpatient level of care appropriate due to severity of illness   Dispo:  Patient From: Home  Planned Disposition:CIR   Expected discharge date: 08/09/20  Medically stable for discharge: No    DVT prophylaxis: Enoxaparin   Code Status:   full  Family Communication:  No family at the bedside     Nutrition Status: Nutrition Problem: Severe Malnutrition Etiology: social / environmental circumstances Signs/Symptoms: severe muscle depletion, severe fat depletion, energy  intake < or equal to 50% for > or equal to 1 month Interventions: MVI, Refer to RD note for recommendations, Magic cup  Consultants:   neurology     Subjective: Patient continue to have difficulty speaking and mobility, no nausea or vomiting. His blood pressure has been elevated.   Objective: Vitals:   08/05/20 2328 08/06/20 0755 08/06/20 0803 08/06/20 1615  BP:  (!) 168/120 (!) 156/117 (!) 154/125  Pulse:  93 94 88  Resp: 17 17 16 18   Temp:  97.9 F (36.6 C)  98.4 F (36.9 C)  TempSrc:      SpO2:  98% 100% 100%    Intake/Output Summary (Last 24 hours) at 08/06/2020 1622 Last data filed at 08/06/2020 0806 Gross per 24 hour  Intake 120 ml  Output 100 ml  Net 20 ml   There were no vitals filed for this visit.  Examination:   General: Not in pain or dyspnea, deconditioned  Neurology: Awake and alert, noted dysarthria.   E ENT: mild pallor, no icterus, oral mucosa moist Cardiovascular: No JVD. S1-S2 present, rhythmic, no gallops, rubs, or murmurs. No lower extremity edema. Pulmonary: positive breath sounds bilaterally, adequate air movement, no wheezing, rhonchi or rales. Gastrointestinal. Abdomen soft and non tender Skin. No rashes Musculoskeletal: no joint deformities     Data Reviewed: I have personally reviewed following labs and imaging studies  CBC: Recent Labs  Lab 08/03/20 1427 08/03/20 1438  WBC 5.2  --   NEUTROABS 3.1  --   HGB 13.6 13.9  HCT 41.0 41.0  MCV 74.5*  --   PLT 207  --    Basic Metabolic Panel: Recent Labs  Lab 08/03/20 1427 08/03/20 1438  NA 142 145  K 3.6 3.7  CL 107 109  CO2 21*  --   GLUCOSE 87 82  BUN 29* 30*  CREATININE 1.10 0.90  CALCIUM 9.7  --    GFR: CrCl cannot be calculated (Unknown ideal weight.). Liver Function Tests: Recent Labs  Lab 08/03/20 1427  AST 16  ALT 11  ALKPHOS 76  BILITOT 0.8  PROT 7.7  ALBUMIN 4.4   No results for input(s): LIPASE, AMYLASE in the last 168 hours. Recent Labs    Lab 08/03/20 2148  AMMONIA 37*   Coagulation Profile: Recent Labs  Lab 08/03/20 1427  INR 1.1   Cardiac Enzymes: No results for input(s): CKTOTAL, CKMB, CKMBINDEX, TROPONINI in the last 168 hours. BNP (last 3 results) No results for input(s): PROBNP in the last 8760 hours. HbA1C: No results for input(s): HGBA1C in the last 72 hours. CBG: No results for input(s): GLUCAP in the last 168 hours. Lipid Profile: Recent Labs    08/04/20 1142  CHOL 123  HDL 33*  LDLCALC 80  TRIG 49  CHOLHDL 3.7   Thyroid Function Tests: No results for input(s): TSH, T4TOTAL, FREET4, T3FREE, THYROIDAB in the last 72 hours. Anemia Panel: Recent Labs    08/03/20 2355  VITAMINB12 90*  FOLATE 18.1      Radiology Studies: I have reviewed all of the imaging during this hospital visit personally     Scheduled Meds: . aspirin EC  81 mg Oral Daily   Or  . aspirin  300 mg Rectal Daily  . atorvastatin  80 mg Oral Daily  . clopidogrel  75 mg Oral Daily  . enoxaparin (LOVENOX) injection  40 mg Subcutaneous Daily  . hydrochlorothiazide  25 mg Oral Daily  . multivitamins with iron  1 tablet Oral Daily  . thiamine  100 mg Oral Daily  . vitamin B-12  1,000 mcg Oral Daily   Continuous Infusions:  LOS: 2 days        Marishka Rentfrow Gerome Apley, MD

## 2020-08-06 NOTE — Progress Notes (Signed)
STROKE TEAM PROGRESS NOTE    INTERVAL HISTORY His RN and speech therapist are at the bedside.  Patient continues to have excessive drooling and difficulty swallowing.  He interestingly is able to move his tongue back-and-forth but when asked to swallow something is unable to initiate swallowing and food just drools out of the corner of his mouth.  He had transcranial Doppler bubble study performed by me personally at the bedside which is negative for right-to-left shunt.  Urine drug screen is yet pending.   OBJECTIVE Vitals:   08/05/20 2324 08/05/20 2328 08/06/20 0755 08/06/20 0803  BP: (!) 136/97  (!) 168/120 (!) 156/117  Pulse: 67  93 94  Resp: (!) 32 17 17 16   Temp: 98.3 F (36.8 C)  97.9 F (36.6 C)   TempSrc: Oral     SpO2: 98%  98% 100%    CBC:  Recent Labs  Lab 08/03/20 1427 08/03/20 1438  WBC 5.2  --   NEUTROABS 3.1  --   HGB 13.6 13.9  HCT 41.0 41.0  MCV 74.5*  --   PLT 207  --     Basic Metabolic Panel:  Recent Labs  Lab 08/03/20 1427 08/03/20 1438  NA 142 145  K 3.6 3.7  CL 107 109  CO2 21*  --   GLUCOSE 87 82  BUN 29* 30*  CREATININE 1.10 0.90  CALCIUM 9.7  --     Lipid Panel:     Component Value Date/Time   CHOL 123 08/04/2020 1142   TRIG 49 08/04/2020 1142   HDL 33 (L) 08/04/2020 1142   CHOLHDL 3.7 08/04/2020 1142   VLDL 10 08/04/2020 1142   LDLCALC 80 08/04/2020 1142   HgbA1c: No results found for: HGBA1C Urine Drug Screen: No results found for: LABOPIA, COCAINSCRNUR, LABBENZ, AMPHETMU, THCU, LABBARB  Alcohol Level     Component Value Date/Time   ETH <10 08/03/2020 2354    IMAGING  CT HEAD CT Angio Head W or Wo Contrast CT ANGIO NECK W OR WO CONTRAST 08/03/2020 IMPRESSION:   CT head:  1. Subcentimeter age-indeterminate infarct within the right cerebellum.  2. Multiple chronic appearing lacunar infarcts within the bilateral basal ganglia.  3. Background mild generalized parenchymal atrophy with advanced chronic small vessel  ischemic disease.   CTA neck:  The common carotid, internal carotid and vertebral arteries are patent within the neck without hemodynamically significant stenosis.   CTA head:  1. No definite proximal right M2 branch occlusion is identified. However, there is a paucity of visualized proximal M2 superior division right MCA branch vessels. If there is concern for an acute right MCA territory stroke, consider catheter based angiography for further evaluation.  2. Intracranial atherosclerotic disease with multifocal stenoses, most notably as follows.  3. Moderate stenosis within the distal M1 right middle cerebral artery.  4. High-grade focal stenosis within the distal A2/proximal A3 left anterior cerebral artery.  5. High-grade focal stenosis within the P2 left PCA.  6. High-grade stenoses within the P4 right PCA.   MR Brain W and Wo Contrast 08/04/2020 IMPRESSION:  1. Patchy small volume acute right MCA territory infarcts involving the right frontoparietal region as above. No associated hemorrhage or mass effect.  2. Underlying advanced cerebral atrophy and chronic microvascular ischemic disease for age, with multiple remote lacunar infarcts about the bilateral basal ganglia and right cerebellum.   Transthoracic Echocardiogram  Normal ejection fraction 60 to 65%.  No cardiac source of embolism  ECG - SR rate  82 BPM. (See cardiology reading for complete details)  PHYSICAL EXAM Blood pressure (!) 156/117, pulse 94, temperature 97.9 F (36.6 C), resp. rate 16, SpO2 100 %. Frail unkempt malnourished looking African-American male not in distress. . Afebrile. Head is nontraumatic. Neck is supple without bruit.    Cardiac exam no murmur or gallop. Lungs are clear to auscultation. Distal pulses are well felt. Neurological Exam  Awake alert oriented to place and person    Diminished attention, registration and recall.  Mildly dysarthric speech.  No aphasia.  Excessive drooling of saliva.  Able to  move tongue but unable to swallow food.  Follows commands well.  Extraocular movements are full range without nystagmus.  Blinks to threat bilaterally.  Bifacial weakness right greater than left lower half.  Tongue deviates to the right on protrusion.  Motor system exam shows no upper or lower extremity drift but mild proximal weakness bilaterally right greater than left.  Mild ankle dorsiflexor weakness in the right.  Reflexes are brisk bilaterally.  Plantars were both equivocal.  Patchy sensory loss in the left chest and medial forearm but not in a stroke distribution.  ASSESSMENT/PLAN [name] is a 59 y.o. male with history of hypertension, prior stroke (residual right-sided weakness and dysarthria), active smoking (reports 1 cigarette/day) presenting with acute onset drooling, patchy left arm and torso sensory changes, headache, cough, bifacial weakness, significantly worsened speech and gait with recent weight loss. He did not receive IV t-PA due to late presentation (>4.5 hours from time of onset).    Stroke: acute right MCA territory infarcts involving the right frontoparietal region with subcentimeter age-indeterminate infarct within the right cerebellum - embolic - source unknown.  Versus intracranial atherosclerosis   resultant increased drooling and mild left-sided weakness   code Stroke CT Head - not ordered  CT head - Subcentimeter age-indeterminate infarct within the right cerebellum. Multiple chronic appearing lacunar infarcts within the bilateral basal ganglia.   MRI head - Patchy small volume acute right MCA territory infarcts involving the right frontoparietal region as above. Underlying advanced cerebral atrophy and chronic microvascular ischemic disease for age, with multiple remote lacunar infarcts about the bilateral basal ganglia and right cerebellum.   MRA head - not ordered  CTA Head - High-grade focal stenosis within the distal A2/proximal A3 left anterior  cerebral artery. High-grade focal stenosis within the P2 left PCA. High-grade stenoses within the P4 right PCA.   CTA Neck - No hemodynamically significant stenosis.   CT Perfusion - not ordered  Carotid Doppler - CTA neck ordered - carotid dopplers not indicated.  2D Echo -normal ejection fraction 60 to 65%.  Sars Corona Virus 2 - negative  LDL -80 mg percent  HgbA1c - pending  UDS - pending  VTE prophylaxis - Lovenox Diet  Diet Order            DIET - DYS 1 Room service appropriate? No; Fluid consistency: Honey Thick  Diet effective now                 No antithrombotic prior to admission, now on aspirin 81 mg daily  Patient counseled to be compliant with his antithrombotic medications  Ongoing aggressive stroke risk factor management  Therapy recommendations:  pending  Disposition:  Pending  Hypertension  Home BP meds: none  Current BP meds: apresoline prn  Stable . Permissive hypertension (OK if < 220/120) but gradually normalize in 5-7 days  . Long-term BP goal normotensive  Hyperlipidemia  Home  Lipid lowering medication: none   LDL 80 mg percent, goal < 70  Current lipid lowering medication: Lipitor 80 mg daily  Continue statin at discharge  Other Stroke Risk Factors  Cigarette smoker - advised to stop smoking  Previous ETOH use  Obesity, There is no height or weight on file to calculate BMI., recommend weight loss, diet and exercise as appropriate   Family hx stroke - not on file  Hx stroke/TIA  Previous substance abuse  Other Active Problems  Code status - Full code  NPO  Mild bradycardia - 50's  Hospital day # 2 Patient has presented with embolic right MCA branch infarct etiology unclear as to intracranial sclerosis or central cardiac or aortic arch source.  Recommend continue ongoing work-up check urine drug screen and hemoglobin A1c.  Continue aspirin Plavix for 3 weeks followed by aspirin alone.  Replace low vitamin B12 as  per primary team.  May need TEE/loop recorder later.   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  Hopefully this will improve in the next few days otherwise will need a feeding tube for medicines and food.  Discussed with Dr. Ella Jubilee. greater than 50% time during this 25-minute visit were spent in counseling and coordination of care about embolic stroke and discussion about evaluation treatment plan and answered questions. Delia Heady, MD To contact Stroke Continuity provider, please refer to [email or web address]. After hours, contact General Neurology

## 2020-08-06 NOTE — Progress Notes (Addendum)
Nutrition Follow-up  DOCUMENTATION CODES:   Severe malnutrition in context of social or environmental circumstances  INTERVENTION:  Magic cup TID with meals, each supplement provides 290 kcal and 9 grams of protein  Continue Vitamin B12 po daily  Continue MVI daily   NUTRITION DIAGNOSIS:   Severe Malnutrition related to social / environmental circumstances as evidenced by severe muscle depletion, severe fat depletion, energy intake < or equal to 50% for > or equal to 1 month.    GOAL:   Patient will meet greater than or equal to 90% of their needs    MONITOR:   PO intake, Supplement acceptance, Weight trends, Labs, I & O's  REASON FOR ASSESSMENT:   Consult Assessment of nutrition requirement/status  ASSESSMENT:   Pt admitted for CVA. PMH includes HTN and prior CVA (2013).  Pt also with B12 deficiency.   Discussed pt with RN. Pt already on oral vitamin B12. Pt should continue to take po daily. If oral supplementation does not improve pt's Vitamin B12 levels, pt should receive parenteral vitamin B12.   Pt providing limited responses to RD questions, appears to be having difficulty speaking. Pt states he typically eats 2 meals per day. For breakfast, he has eggs and grits. He then skips lunch and has "anything" for dinner. Pt states dinner rarely contains protein. Unable to determine reason for limited po intake. Poor po intake is likely cause of vitamin B12 deficiency. Suspect pt is food insecure; however, unable to confirm at this time. Discussed importance of adequate kcal and protein intake with pt and discussed methods for increasing his intake at home. Pt agreeable to use of oral nutrition supplements.   No PO intake documented.   No wt history available for review. No height/wt on file to estimate pt's nutrition needs.   Labs: Vitamin B12 90 (L) Medications: MVI, Thiamine, Vitamin B12 daily  NUTRITION - FOCUSED PHYSICAL EXAM:    Most  Recent Value  Orbital Region Severe depletion  Upper Arm Region Severe depletion  Thoracic and Lumbar Region Severe depletion  Buccal Region Severe depletion  Temple Region Severe depletion  Clavicle Bone Region Severe depletion  Clavicle and Acromion Bone Region Severe depletion  Scapular Bone Region Severe depletion  Dorsal Hand Severe depletion  Patellar Region Severe depletion  Anterior Thigh Region Severe depletion  Posterior Calf Region Severe depletion  Edema (RD Assessment) None  Hair Reviewed  Eyes Reviewed  Mouth Reviewed  Skin Reviewed  Nails Reviewed       Diet Order:   Diet Order            DIET - DYS 1 Room service appropriate? No; Fluid consistency: Honey Thick  Diet effective now                 EDUCATION NEEDS:   Education needs have been addressed  Skin:  Skin Assessment: Reviewed RN Assessment  Last BM:  PTA  Height:   Ht Readings from Last 1 Encounters:  No data found for Ht    Weight:   Wt Readings from Last 1 Encounters:  No data found for Wt   BMI:  There is no height or weight on file to calculate BMI.  Estimated Nutritional Needs:   Kcal:  30-35 kcal/kg  Protein:  1.2-1.5 g/kg  Fluid:  24ml/kcal    Eugene Gavia, MS, RD, LDN RD pager number and weekend/on-call pager number located in Amion.

## 2020-08-06 NOTE — Progress Notes (Signed)
Physical Therapy Treatment Patient Details Name: Adam [name] MRN: [id number] DOB: [birth year] Today's Date: 08/06/2020    History of Present Illness 59 y.o. male with medical history significant for essential hypertension, prior CVA in 2013, seen in [place], who presented to [facility] ED from home with complaints of left arm numbness and drooling. MRI revealed patchy small volume acute right MCA territory infarcts involving the right frontoparietal region and multiple remote lacunar infarcts about the bilateral basal ganglia and right cerebellum.    PT Comments    Pt ambulating great hallway distance this day without AD and supervision assist only from PT, frequent VCs provided for increasing R foot clearance. PT moderately challenged pt balance, with no LOB but mild unsteadiness (pt-recovered) noted with head turns. Pt proficiently navigated a flight of steps with use of railing, no cuing needed. Per pt, he feels he is at mobility baseline, his speech is the only frustration for him presently. Pt has a sister and brother-in-law at home, his sister works but brother-in-law could help him if needed. PT updating recommendation to reflect home with HHPT, hopefully progressing to OPPT to work on higher level balance tasks and gait. Will continue to follow acutely.     Follow Up Recommendations  Home health PT;Supervision for mobility/OOB     Equipment Recommendations  Other (comment) (TBD)    Recommendations for Other Services       Precautions / Restrictions Precautions Precautions: Fall Restrictions Weight Bearing Restrictions: No    Mobility  Bed Mobility Overal bed mobility: Needs Assistance Bed Mobility: Supine to Sit     Supine to sit: Supervision     General bed mobility comments: for safety  Transfers Overall transfer level: Needs assistance Equipment used: None Transfers: Sit to/from Stand Sit to Stand: Supervision         General transfer comment: for safety only,  increased time to rise and steady.  Ambulation/Gait Ambulation/Gait assistance: Supervision;Min guard Gait Distance (Feet): 500 Feet Assistive device: None Gait Pattern/deviations: Step-through pattern;Decreased stride length;Ataxic;Shuffle;Narrow base of support;Decreased dorsiflexion - right Gait velocity: decr   General Gait Details: supervision for safety, occasional min guard when PT challenging pt balance. Pt with decreased R toe clearance, PT cuing increased foot clearance multiple times during gait.   Stairs Stairs: Yes Stairs assistance: Supervision Stair Management: One rail Right;Alternating pattern;Step to pattern;Forwards Number of Stairs: 10 General stair comments: alternating pattern with ascending, step-to pattern with descending. use of handrail, increased time to perform.   Wheelchair Mobility    Modified Rankin (Stroke Patients Only) Modified Rankin (Stroke Patients Only) Pre-Morbid Rankin Score: Slight disability Modified Rankin: Moderate disability     Balance Overall balance assessment: Needs assistance Sitting-balance support: No upper extremity supported;Feet supported Sitting balance-Leahy Scale: Good     Standing balance support: No upper extremity supported;During functional activity Standing balance-Leahy Scale: Fair Standing balance comment: accepts mod challenge             High level balance activites: Head turns;Turns;Direction changes;Other (comment) High Level Balance Comments: mild deviation to straight line gait with horizontal head turns; [facility] direction changes, step navigation, and stepping over objects            Cognition Arousal/Alertness: Awake/alert Behavior During Therapy: WFL for tasks assessed/performed Overall Cognitive Status: No family/caregiver present to determine baseline cognitive functioning Area of Impairment: Problem solving  Problem Solving: Requires verbal  cues;Slow processing        Exercises      General Comments General comments (skin integrity, edema, etc.): unable to manage secretions in mouth, wipes mouth frequently with wash cloth for saliva management.      Pertinent Vitals/Pain Pain Assessment: No/denies pain    Home Living                      Prior Function            PT Goals (current goals can now be found in the care plan section) Acute Rehab PT Goals Patient Stated Goal: go home PT Goal Formulation: With patient Time For Goal Achievement: 08/18/20 Potential to Achieve Goals: Good Progress towards PT goals: Progressing toward goals    Frequency    Min 4X/week      PT Plan Discharge plan needs to be updated    Co-evaluation              AM-PAC PT "6 Clicks" Mobility   Outcome Measure  Help needed turning from your back to your side while in a flat bed without using bedrails?: None Help needed moving from lying on your back to sitting on the side of a flat bed without using bedrails?: None Help needed moving to and from a bed to a chair (including a wheelchair)?: None Help needed standing up from a chair using your arms (e.g., wheelchair or bedside chair)?: None Help needed to walk in hospital room?: A Little Help needed climbing 3-5 steps with a railing? : A Little 6 Click Score: 22    End of Session Equipment Utilized During Treatment: Gait belt Activity Tolerance: Patient tolerated treatment well Patient left: in bed;with call bell/phone within reach (sitting EOB in ED ) Nurse Communication: Mobility status PT Visit Diagnosis: Unsteadiness on feet (R26.81);Difficulty in walking, not elsewhere classified (R26.2)     Time: 1607-3710 PT Time Calculation (min) (ACUTE ONLY): 19 min  Charges:  $Gait Training: 8-22 mins                    Nilam Quakenbush E, PT Acute Rehabilitation Services Pager [phone]  Office [phone]    Tyrone Apple D Despina Hidden 08/06/2020, 4:45 PM

## 2020-08-06 NOTE — Progress Notes (Signed)
Inpatient Rehabilitation Admissions Coordinator  I met with patient at bedside for rehab assessment. He reports ambulating in room to bathroom unassisted. I encouraged him to call for assist anytime he was up. Noted swallowing issues persist. I contacted his son, Laural Roes, by phone to discuss caregiver support at home. Patient lives with his sister. I have left her a voicemail to call me to discuss caregiver support and to clarify prior baseline issues.  Danne Baxter, RN, MSN Rehab Admissions Coordinator [phone] 08/06/2020 2:24 PM

## 2020-08-06 NOTE — Plan of Care (Signed)
Plan of care discussed with patient. Patient verbalizes understanding.

## 2020-08-06 NOTE — Consult Note (Signed)
Physical Medicine and Rehabilitation Consult Reason for Consult: Headache with bifacial weakness increasing slurred speech Referring Physician: [facility]  HPI: Adam [name] is a 59 y.o. 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  On no prescription medications.  Per chart review patient lives with his sister independent mobility and ADLs by report.  Presented 08/03/2020 with left arm weakness.  Admission chemistries BUN 29 creatinine 1.10 urine drug screen pending, alcohol negative, ammonia 37.  MRI showed patchy small volume acute right MCA territory infarct involving the right frontal parietal region.  No associated hemorrhage or mass-effect.  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  CT angiogram of head and neck with no definite proximal right M2 branch occlusion identified.  The common carotid internal carotid artery vertebral arteries are patent within the neck without significant stenosis.  Echocardiogram with ejection fraction of 60 to 65% no wall motion abnormalities.  Currently maintained on aspirin and Plavix for CVA prophylaxis x3 weeks and aspirin alone.  Subcutaneous Lovenox for DVT prophylaxis.  Awaiting plan for possible TEE and loop recorder.  Dysphagia #1 honey thick liquid diet.  Therapy evaluation completed with recommendations of physical medicine rehab consult  Review of Systems  Constitutional: Negative for chills and fever.  HENT: Negative for hearing loss.   Eyes: Negative for blurred vision and double vision.  Respiratory: Positive for shortness of breath. Negative for cough.   Cardiovascular: Positive for leg swelling. Negative for chest pain and palpitations.  Gastrointestinal: Positive for constipation. Negative for heartburn, nausea and vomiting.  Genitourinary: Negative for  dysuria, flank pain and hematuria.  Musculoskeletal: Positive for myalgias.  Skin: Negative for rash.  Neurological: Positive for speech change and weakness.  All other systems reviewed and are negative.  History reviewed. No pertinent past medical history. History reviewed. No pertinent surgical history. No family history on file. Social History:  reports that he has been smoking. He has never used smokeless tobacco. He reports previous alcohol use. He reports previous drug use. Allergies: No Known Allergies No medications prior to admission.    Home: Home Living Family/patient expects to be discharged to:: Private residence Living Arrangements: Other relatives (Sister) Available Help at Discharge: Family, Available PRN/intermittently Type of Home: Apartment Home Access: Stairs to enter Secretary/administrator of Steps: 6 Entrance Stairs-Rails: Left Home Layout: One level Bathroom Shower/Tub: Engineer, manufacturing systems: Standard Home Equipment: None  Functional History: Prior Function Level of Independence: Independent Comments: ADLs and IADLs. Not driving. Enjoys hangout at the basketball court Functional Status:  Mobility: Bed Mobility Overal bed mobility: Needs Assistance Bed Mobility: Supine to Sit Supine to sit: Min guard Sit to supine: Min guard, HOB elevated General bed mobility comments: Sitting at EOB. Transfers Overall transfer level: Needs assistance Equipment used: None Transfers: Sit to/from Stand Sit to Stand: Supervision Stand pivot transfers: Min guard General transfer comment: Supervision for safety. No physical A needed Ambulation/Gait Ambulation/Gait assistance: Min assist Gait Distance (Feet): 75 Feet Assistive device: None Gait Pattern/deviations: Step-through pattern, Decreased stride length, Ataxic, Shuffle, Narrow base of support General Gait Details: Shuffle type gait with notable R foot inversion. Pt reports he has a callous on R foot.  Pt given cues for increased step height. Required min A for steadying assist.  Gait velocity: decreased    ADL: ADL Overall ADL's : Needs assistance/impaired Eating/Feeding: Set up, Sitting  Grooming: Oral care, Supervision/safety, Set up, Standing Functional mobility during ADLs: Supervision/safety General ADL Comments:  Pt performing ADLs and functional mobility at Supervision level. Pt presenting with decreased balance and cogntion. Requiring increased time throughout for processing and motor movements. However, feel pt is close to his baseline.   Cognition: Cognition Overall Cognitive Status: Impaired/Different from baseline Orientation Level: Oriented to person, Oriented to place, Oriented to time (birthday) Cognition Arousal/Alertness: Awake/alert Behavior During Therapy: WFL for tasks assessed/performed Overall Cognitive Status: Impaired/Different from baseline Area of Impairment: Problem solving, Awareness Awareness: Emergent Problem Solving: Slow processing, Requires verbal cues General Comments: Pt presenting with slow processing requiring increased time throughout. Following commands and performing BADLs without cues.   Blood pressure (!) 156/117, pulse 94, temperature 97.9 F (36.6 C), resp. rate 16, SpO2 100 %.  General: Alert and oriented x 2 (not time), No apparent distress HEENT: Head is normocephalic, atraumatic, PERRLA, EOMI, sclera anicteric, oral mucosa pink and moist, dentition intact, ext ear canals clear,  Neck: Supple without JVD or lymphadenopathy Heart: Reg rate and rhythm. No murmurs rubs or gallops Chest: CTA bilaterally without wheezes, rales, or rhonchi; no distress Abdomen: Soft, non-tender, non-distended, bowel sounds positive. Extremities: No clubbing, cyanosis, or edema. Pulses are 2+ Skin: Clean and intact without signs of breakdown Neuro: Patient is alert.  He does display some decrease in attention.  Speech is dysarthric.  Follows simple  commands.  Provides his name. 5/5 strength throughout with the exception of 4/5 right DF. Bifacial weakness right >left. Decreased sensation left chest and medial forearm.  Psych: Pt's affect is appropriate. Pt is cooperative  No results found for this or any previous visit (from the past 24 hour(s)). DG Swallowing Func-Speech Pathology  Result Date: 08/05/2020 Objective Swallowing Evaluation: Type of Study: MBS-Modified Barium Swallow Study  Patient Details Name: Adam [name] MRN: [id number] Date of Birth: 1960/12/29 Today's Date: 08/05/2020 Time: SLP Start Time (ACUTE ONLY): 3818 -SLP Stop Time (ACUTE ONLY): 0902 SLP Time Calculation (min) (ACUTE ONLY): 23 min Past Medical History: No past medical history on file. Past Surgical History: No past surgical history on file. HPI: Patient is a 59 y.o. 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 Per chart review, his sister had reported she noticed bifacial weakness, significant worsened speech and significantly worsened gait. MRI brain revealed patchy small volume acute right MCA territory infarcts involving right frontoparietal region but no associated hemorrhage or mass effect, as well as underlying advanced cerebral atrophy and chronic microvascular ischemic disease, multiple remote lacunar infarcts about the basal ganglia and right cerebellum.  Assessment / Plan / Recommendation CHL IP CLINICAL IMPRESSIONS 08/05/2020 Clinical Impression Pt presents with oropharyngeal dysphagia characterized by reduced labial seal, impaired bolus propulsion, impaired bolus cohesion, reduced lingual retraction, reduced anterior laryngeal movement, and a pharyngeal delay. He demonstrated anterior spillage, oral holding, lingual pumping, piecemeal deglutition, premature spillage, vallecular residue, pyriform sinus residue, penetration (PAS  3) of thin liquids and silent aspiration (PAS 8) of nectar thick liquids secondary to the pharyngeal delay. Pt's most significant impairment is oral holding with it sometimes being over a minute in length. Pt required verbal and tactile cues to initiate a swallow and anterior spillage was still noted intermittently when pt attempted to swallow.  Pt's swallow was most timely with with puree solids and honey thick liquids via cup. Amount of pharyngeal residue increased with  bolus sizes. 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 Pt is safest with his current diet of dysphagia 1 (puree) solids and honey thick liquids. However, considering the level of support required for swallowing of limited boluses during the study, it is anticipated that may not be able to maintain adequate nutrition or hydration p.o. and that supplemental nonoral nutrition may be needed in the future. SLP will follow for dysphagia treatment.  SLP Visit Diagnosis Dysphagia, oropharyngeal phase (R13.12) Attention and concentration deficit following -- Frontal lobe and executive function deficit following -- Impact on safety and function Mild aspiration risk;Moderate aspiration risk   CHL IP TREATMENT RECOMMENDATION 08/05/2020 Treatment Recommendations Therapy as outlined in treatment plan below   Prognosis 08/05/2020 Prognosis for Safe Diet Advancement Good Barriers to Reach Goals Cognitive deficits;Severity of deficits Barriers/Prognosis Comment -- CHL IP DIET RECOMMENDATION 08/05/2020 SLP Diet Recommendations Honey thick liquids;Dysphagia 1 (Puree) solids Liquid Administration via Cup;Straw Medication Administration Crushed with puree Compensations Slow rate;Small sips/bites;Minimize environmental distractions;Follow solids with liquid;Other (Comment) Postural Changes Remain semi-upright after after feeds/meals (Comment)   CHL IP OTHER RECOMMENDATIONS 08/05/2020 Recommended Consults -- Oral Care  Recommendations Oral care BID Other Recommendations --   CHL IP FOLLOW UP RECOMMENDATIONS 08/05/2020 Follow up Recommendations Inpatient Rehab   CHL IP FREQUENCY AND DURATION 08/05/2020 Speech Therapy Frequency (ACUTE ONLY) min 2x/week Treatment Duration 2 weeks      CHL IP ORAL PHASE 08/05/2020 Oral Phase Impaired Oral - Pudding Teaspoon -- Oral - Pudding Cup -- Oral - Honey Teaspoon -- Oral - Honey Cup Reduced posterior propulsion;Holding of bolus;Piecemeal swallowing;Right anterior bolus loss;Left anterior bolus loss;Lingual pumping;Delayed oral transit;Decreased bolus cohesion;Premature spillage Oral - Nectar Teaspoon -- Oral - Nectar Cup Reduced posterior propulsion;Holding of bolus;Piecemeal swallowing;Right anterior bolus loss;Left anterior bolus loss;Lingual pumping;Delayed oral transit;Decreased bolus cohesion;Premature spillage Oral - Nectar Straw Reduced posterior propulsion;Holding of bolus;Piecemeal swallowing;Right anterior bolus loss;Left anterior bolus loss;Lingual pumping;Delayed oral transit;Decreased bolus cohesion;Premature spillage Oral - Thin Teaspoon -- Oral - Thin Cup Reduced posterior propulsion;Holding of bolus;Piecemeal swallowing;Right anterior bolus loss;Left anterior bolus loss;Lingual pumping;Delayed oral transit;Decreased bolus cohesion;Premature spillage Oral - Thin Straw Reduced posterior propulsion;Holding of bolus;Piecemeal swallowing;Right anterior bolus loss;Left anterior bolus loss;Lingual pumping;Delayed oral transit;Decreased bolus cohesion;Premature spillage Oral - Puree Reduced posterior propulsion;Holding of bolus;Piecemeal swallowing;Right anterior bolus loss;Left anterior bolus loss;Lingual pumping;Delayed oral transit;Decreased bolus cohesion;Premature spillage Oral - Mech Soft -- Oral - Regular -- Oral - Multi-Consistency -- Oral - Pill -- Oral Phase - Comment --  CHL IP PHARYNGEAL PHASE 08/05/2020 Pharyngeal Phase Impaired Pharyngeal- Pudding Teaspoon -- Pharyngeal --  Pharyngeal- Pudding Cup -- Pharyngeal -- Pharyngeal- Honey Teaspoon Reduced anterior laryngeal mobility;Delayed swallow initiation-vallecula;Delayed swallow initiation-pyriform sinuses;Pharyngeal residue - valleculae;Pharyngeal residue - pyriform;Reduced tongue base retraction Pharyngeal -- Pharyngeal- Honey Cup -- Pharyngeal -- Pharyngeal- Nectar Teaspoon -- Pharyngeal -- Pharyngeal- Nectar Cup Reduced anterior laryngeal mobility;Delayed swallow initiation-vallecula;Delayed swallow initiation-pyriform sinuses;Pharyngeal residue - valleculae;Pharyngeal residue - pyriform;Reduced tongue base retraction;Penetration/Aspiration before swallow Pharyngeal Material enters airway, passes BELOW cords without attempt by patient to eject out (silent aspiration) Pharyngeal- Nectar Straw Reduced anterior laryngeal mobility;Delayed swallow initiation-vallecula;Delayed swallow initiation-pyriform sinuses;Pharyngeal residue - valleculae;Pharyngeal residue - pyriform;Reduced tongue base retraction Pharyngeal -- Pharyngeal- Thin Teaspoon -- Pharyngeal -- Pharyngeal- Thin Cup Reduced anterior laryngeal mobility;Delayed swallow initiation-vallecula;Delayed swallow initiation-pyriform sinuses;Pharyngeal residue - valleculae;Pharyngeal residue - pyriform;Reduced tongue base retraction;Penetration/Aspiration during swallow Pharyngeal Material enters airway, remains ABOVE vocal cords and not ejected out Pharyngeal- Thin Straw Reduced anterior laryngeal mobility;Delayed swallow initiation-vallecula;Delayed swallow initiation-pyriform  sinuses;Pharyngeal residue - valleculae;Pharyngeal residue - pyriform;Reduced tongue base retraction Pharyngeal -- Pharyngeal- Puree Reduced anterior laryngeal mobility;Delayed swallow initiation-vallecula;Delayed swallow initiation-pyriform sinuses;Pharyngeal residue - valleculae;Pharyngeal residue - pyriform;Reduced tongue base retraction Pharyngeal -- Pharyngeal- Mechanical Soft -- Pharyngeal -- Pharyngeal-  Regular -- Pharyngeal -- Pharyngeal- Multi-consistency -- Pharyngeal -- Pharyngeal- Pill -- Pharyngeal -- Pharyngeal Comment --  No flowsheet data found. Shanika I. Vear ClockPhillips, MS, CCC-SLP Acute Rehabilitation Services Office number [phone] Pager [phone] Scheryl MartenShanika I Phillips 08/05/2020, 10:11 AM              ECHOCARDIOGRAM COMPLETE BUBBLE STUDY  Result Date: 08/04/2020    ECHOCARDIOGRAM REPORT   Patient Name:   Adam KussmaulCLINTON [name] Date of Exam: 08/04/2020 Medical Rec #:  [id number]      Height: Accession #:    [id number]     Weight: Date of Birth:  08/20/1961      BSA: Patient Age:    58 years       BP:           171/70 mmHg Patient Gender: M              HR:           55 bpm. Exam Location:  Inpatient Procedure: 2D Adam Indications:    Stroke 434.91 / I63.9  History:        Patient has no prior history of Echocardiogram examinations.                 Stroke; Risk Factors:Hypertension and Current Smoker.  Sonographer:    Jeryl ColumbiaJohanna Elliott Referring Phys: [id number] CAROLE N HALL IMPRESSIONS  1. Left ventricular ejection fraction, by estimation, is 60 to 65%. The left ventricle has normal function. The left ventricle has no regional wall motion abnormalities. Left ventricular diastolic parameters were normal.  2. Right ventricular systolic function is normal. The right ventricular size is normal.  3. The mitral valve is normal in structure. Trivial mitral valve regurgitation. No evidence of mitral stenosis.  4. The aortic valve is normal in structure. Aortic valve regurgitation is not visualized. No aortic stenosis is present.  5. The inferior vena cava is normal in size with greater than 50% respiratory variability, suggesting right atrial pressure of 3 mmHg.  6. Agitated saline contrast bubble study was negative, with no evidence of any interatrial shunt. FINDINGS  Left Ventricle: Left ventricular ejection fraction, by estimation, is 60 to 65%. The left ventricle has normal function. The left ventricle has no regional  wall motion abnormalities. The left ventricular internal cavity size was normal in size. There is  no left ventricular hypertrophy. Left ventricular diastolic parameters were normal. Right Ventricle: The right ventricular size is normal. No increase in right ventricular wall thickness. Right ventricular systolic function is normal. Left Atrium: Left atrial size was normal in size. Right Atrium: Right atrial size was normal in size. Pericardium: There is no evidence of pericardial effusion. Mitral Valve: The mitral valve is normal in structure. There is mild thickening of the mitral valve leaflet(s). Trivial mitral valve regurgitation. No evidence of mitral valve stenosis. Tricuspid Valve: The tricuspid valve is normal in structure. Tricuspid valve regurgitation is not demonstrated. No evidence of tricuspid stenosis. Aortic Valve: The aortic valve is normal in structure. Aortic valve regurgitation is not visualized. No aortic stenosis is present. Pulmonic Valve: The pulmonic valve was normal in structure. Pulmonic valve regurgitation is not visualized. No evidence of pulmonic stenosis. Aorta: The aortic root is normal in size and structure. Venous:  The inferior vena cava is normal in size with greater than 50% respiratory variability, suggesting right atrial pressure of 3 mmHg. IAS/Shunts: No atrial level shunt detected by color flow Doppler. Agitated saline contrast was given intravenously to evaluate for intracardiac shunting. Agitated saline contrast bubble study was negative, with no evidence of any interatrial shunt. Charlton Haws MD Electronically signed by Charlton Haws MD Signature Date/Time: 08/04/2020/3:38:04 PM    Final    Assessment/Plan: Diagnosis: acute right MCA infarct 1. Does the need for close, 24 hr/day medical supervision in concert with the patient's rehab needs make it unreasonable for this patient to be served in a less intensive setting? Yes 2. Co-Morbidities requiring supervision/potential  complications: dysphagia, headache, bifacial weakness, slurred speech, HTN, B12 deficiency, moderate protein-calorie malnutrition 3. Due to bladder management, bowel management, safety, skin/wound care, disease management, medication administration, pain management and patient education, does the patient require 24 hr/day rehab nursing? Yes 4. Does the patient require coordinated care of a physician, rehab nurse, therapy disciplines of PT, OT, SLP to address physical and functional deficits in the context of the above medical diagnosis(es)? Yes Addressing deficits in the following areas: balance, endurance, locomotion, strength, transferring, bowel/bladder control, bathing, dressing, feeding, grooming, toileting, cognition, swallowing and psychosocial support 5. Can the patient actively participate in an intensive therapy program of at least 3 hrs of therapy per day at least 5 days per week? Yes 6. The potential for patient to make measurable gains while on inpatient rehab is excellent 7. Anticipated functional outcomes upon discharge from inpatient rehab are supervision  with PT, supervision with OT, supervision with SLP. 8. Estimated rehab length of stay to reach the above functional goals is: 10-14 days 9. Anticipated discharge destination: Home 10. Overall Rehab/Functional Prognosis: excellent  RECOMMENDATIONS: This patient's condition is appropriate for continued rehabilitative care in the following setting: CIR Patient has agreed to participate in recommended program. Yes Note that insurance prior authorization may be required for reimbursement for recommended care.  Comment: Thank you for this consult. Admission coordinator to follow.   I have personally performed a face to face diagnostic evaluation, including, but not limited to relevant history and physical exam findings, of this patient and developed relevant assessment and plan.  Additionally, I have reviewed and concur with the  physician assistant's documentation above.  Sula Soda, MD  Mcarthur Rossetti Angiulli, PA-C 08/06/2020

## 2020-08-06 NOTE — TOC Initial Note (Signed)
Transition of Care [facility]) - Initial/Assessment Note    Patient Details  Name: Adam [name] MRN: [id number] Date of Birth: 05/25/1961  [facility] CM/SW Contact:    Lorri Frederick, LCSW Phone Number: 08/06/2020, 2:52 PM  Clinical Narrative:   CSW spoke with pt regarding substance use issues.  Pt reports he smokes marijuana once per week, usually 1-2 blunts.  Pt reports he would like to quit and was interested in getting help from a professional.  CSW provided list of outpt resources and highlighted [facility] and [facility] as potential options that would accept his medicaid.  Pt discharge plan currently CIR.  Pt gave permission for CSW to speak to his sister, [name].  Pt reports he has had one shot of the vaccine, several months ago.  Pt states he was scheduled to have 2nd shot and his transportation did not show up.  CSW asked if pt would like second shot while at the hospital but pt only said he "would think about it."  Pt also reports he does not have PCP in place.                Expected Discharge Plan: IP Rehab Facility Barriers to Discharge: Continued Medical Work up   Patient Goals and CMS Choice Patient states their goals for this hospitalization and ongoing recovery are:: Get back to where I was--playing piano at church      Expected Discharge Plan and Services Expected Discharge Plan: IP Rehab Facility     Post Acute Care Choice: IP Rehab Living arrangements for the past 2 months: Apartment                                      Prior Living Arrangements/Services Living arrangements for the past 2 months: Apartment Lives with:: Siblings (sister) Patient language and need for interpreter reviewed:: Yes Do you feel safe going back to the place where you live?: Yes      Need for Family Participation in Patient Care: Yes (Comment) Care giver support system in place?: Yes (comment)   Criminal Activity/Legal Involvement Pertinent to Current  Situation/Hospitalization: No - Comment as needed  Activities of Daily Living      Permission Sought/Granted Permission sought to share information with : Family Supports Permission granted to share information with : Yes, Verbal Permission Granted  Share Information with NAME: [name], sister           Emotional Assessment Appearance:: Appears older than stated age Attitude/Demeanor/Rapport: Engaged Affect (typically observed): Pleasant Orientation: : Oriented to Self, Oriented to Place, Oriented to  Time, Oriented to Situation Alcohol / Substance Use: Illicit Drugs (marijuana) Psych Involvement: No (comment)  Admission diagnosis:  Drooling [K11.7] Left arm numbness [R20.0] Generalized hyperreflexia [R29.2] Acute CVA (cerebrovascular accident) [facility]) [I63.9] Patient Active Problem List   Diagnosis Date Noted  . Acute CVA (cerebrovascular accident) (HCC) 08/04/2020  . HTN (hypertension) 08/04/2020  . B12 deficiency 08/04/2020  . Protein-calorie malnutrition, severe (HCC) 08/04/2020   PCP:  Patient, [name] Pharmacy:   [id number] - [place], [place] - [address] AT [facility] OF [address] [place]  [address] Phone: [phone] Fax: [phone]     Social Determinants of Health (SDOH) Interventions    Readmission Risk Interventions No flowsheet data found.

## 2020-08-06 NOTE — Progress Notes (Cosign Needed)
Oral meds. Not given today due to swallowing concerns, Neurologist aware

## 2020-08-06 NOTE — Progress Notes (Signed)
TCD bubble study completed with Dr. Pearlean Brownie. Refer to "CV Proc" under chart review to view preliminary results.  08/06/2020 12:03 PM Eula Fried., MHA, RVT, RDCS, RDMS

## 2020-08-06 NOTE — Evaluation (Signed)
Speech Language Pathology Evaluation Patient Details Name: Adam [name] MRN: [id number] DOB: [birth year] Today's Date: 08/06/2020 Time: 1044-1100 SLP Time Calculation (min) (ACUTE ONLY): 16 min  Problem List:  Patient Active Problem List   Diagnosis Date Noted  . Acute CVA (cerebrovascular accident) (HCC) 08/04/2020  . HTN (hypertension) 08/04/2020  . B12 deficiency 08/04/2020  . Protein-calorie malnutrition, moderate (HCC) 08/04/2020   Past Medical History: History reviewed. No pertinent past medical history. Past Surgical History: History reviewed. No pertinent surgical history. HPI:  Patient is a 59 y.o. 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 Per chart review, his sister had reported she noticed bifacial weakness, significant worsened speech and significantly worsened gait. MRI brain revealed patchy small volume acute right MCA territory infarcts involving right frontoparietal region but no associated hemorrhage or mass effect, as well as underlying advanced cerebral atrophy and chronic microvascular ischemic disease, multiple remote lacunar infarcts about the basal ganglia and right cerebellum.   Assessment / Plan / Recommendation Clinical Impression  Pt presents with mild dysarthria and cognitive impairments although baseline status is not fully known. He requires cues to verbalize and initially attempts to gesture but is able to communicate in fluent sentences with appropriate expressive language. He comprehended all information during assessment and followed a 3 step command. Although difficulty manipulating boluses with food/liquids, he did not have oral apraxia during oral-motor examination. He is not managing his secretions with frequent anterior leakage and pooling. Basic cognitive abilities intact but suspect difficulty with executive  functioning and awareness. Recommend continue ST while in acute care and home health at discharge.        SLP Assessment  SLP Recommendation/Assessment: Patient needs continued Speech Lanaguage Pathology Services SLP Visit Diagnosis: Dysarthria and anarthria (R47.1);Cognitive communication deficit (R41.841)    Follow Up Recommendations  Home health SLP (home health )    Frequency and Duration min 2x/week  2 weeks      SLP Evaluation Cognition  Overall Cognitive Status: Difficult to assess Arousal/Alertness: Awake/alert Orientation Level: Oriented X4 Attention: Sustained Sustained Attention: Appears intact Memory:  (will assess further, appears functional for basic) Awareness: Impaired Awareness Impairment: Anticipatory impairment;Emergent impairment Problem Solving:  (will assess further) Safety/Judgment: Impaired       Comprehension  Auditory Comprehension Overall Auditory Comprehension: Appears within functional limits for tasks assessed Commands: Within Functional Limits (for one 3 step command) Visual Recognition/Discrimination Discrimination: Not tested Reading Comprehension Reading Status: Not tested    Expression Expression Primary Mode of Expression: Verbal Verbal Expression Overall Verbal Expression: Appears within functional limits for tasks assessed Initiation: No impairment Level of Generative/Spontaneous Verbalization: Sentence Repetition: No impairment Naming: No impairment Pragmatics: No impairment Written Expression Dominant Hand: Right Written Expression: Not tested (to be assessed)   Oral / Motor  Oral Motor/Sensory Function Overall Oral Motor/Sensory Function: Within functional limits Facial Symmetry: Within Functional Limits Facial Strength: Within Functional Limits Motor Speech Overall Motor Speech: Impaired Respiration: Within functional limits Phonation: Low vocal intensity Resonance: Within functional limits Articulation:  Impaired Level of Impairment: Conversation Intelligibility: Intelligibility reduced Word: 75-100% accurate Phrase: 75-100% accurate Sentence: 75-100% accurate Conversation: 50-74% accurate (closer to 74%) Motor Planning: Witnin functional limits   GO                    [name] 08/06/2020, 12:37 PM  Breck Coons Taegan Standage M.Ed Sports administrator  Pager [phone] Office [phone]

## 2020-08-06 NOTE — Discharge Instructions (Signed)
Nutrition Post Hospital Stay Proper nutrition can help your body recover from illness and injury.   Foods and beverages high in protein, vitamins, and minerals help rebuild muscle loss, promote healing, & reduce fall risk.   .In addition to eating healthy foods, a nutrition shake is an easy, delicious way to get the nutrition you need during and after your hospital stay  It is recommended that you continue to drink 2-3 bottles per day of:       Ensure Enlive or equivalent for at least 1 month (30 days) after your hospital stay   Tips for adding a nutrition shake into your routine: As allowed, drink one with vitamins or medications instead of water or juice Enjoy one as a tasty mid-morning or afternoon snack Drink cold or make a milkshake out of it Drink one instead of milk with cereal or snacks Use as a coffee creamer   Available at the following grocery stores and pharmacies:           * Karin Golden * [employer] * [employer]  * R[employer]          * Walmart * [employer]  * Wal[facility]reens      * Target  * BJ's   * [facility]S  * [employer]   * [facility] [phone]            For COUPONS visit: [email or web address] or [email or web address]   Suggested Substitutions Ensure Plus = Boost Plus = Carnation Breakfast Essentials = Boost Compact Ensure Active Clear = Boost Breeze Glucerna Shake = Boost Glucose Control = Carnation Breakfast Essentials SUGAR FREE

## 2020-08-07 DIAGNOSIS — N179 Acute kidney failure, unspecified: Secondary | ICD-10-CM | POA: Clinically undetermined

## 2020-08-07 DIAGNOSIS — E876 Hypokalemia: Secondary | ICD-10-CM

## 2020-08-07 DIAGNOSIS — K117 Disturbances of salivary secretion: Secondary | ICD-10-CM | POA: Diagnosis present

## 2020-08-07 LAB — BASIC METABOLIC PANEL
Anion gap: 13 (ref 5–15)
BUN: 39 mg/dL — ABNORMAL HIGH (ref 6–20)
CO2: 24 mmol/L (ref 22–32)
Calcium: 10.4 mg/dL — ABNORMAL HIGH (ref 8.9–10.3)
Chloride: 107 mmol/L (ref 98–111)
Creatinine, Ser: 1.3 mg/dL — ABNORMAL HIGH (ref 0.61–1.24)
GFR calc Af Amer: >60 mL/min (ref >60)
GFR calc non Af Amer: 60 mL/min — ABNORMAL LOW (ref >60)
Glucose, Bld: 105 mg/dL — ABNORMAL HIGH (ref 70–99)
Potassium: 3.4 mmol/L — ABNORMAL LOW (ref 3.5–5.1)
Sodium: 144 mmol/L (ref 135–145)

## 2020-08-07 LAB — TSH: TSH: 1.347 u[IU]/mL (ref 0.350–4.500)

## 2020-08-07 MED ORDER — AMLODIPINE BESYLATE 5 MG PO TABS
5.0000 mg | ORAL_TABLET | Freq: Every day | ORAL | Status: DC
  Administered 2020-08-07 – 2020-08-10 (×3): 5 mg via ORAL
  Filled 2020-08-07 (×3): qty 1

## 2020-08-07 MED ORDER — POTASSIUM CHLORIDE 20 MEQ PO PACK
40.0000 meq | PACK | Freq: Once | ORAL | Status: AC
  Administered 2020-08-07: 40 meq via ORAL
  Filled 2020-08-07: qty 2

## 2020-08-07 NOTE — Plan of Care (Signed)

## 2020-08-07 NOTE — Progress Notes (Signed)
Nutrition Follow-up  DOCUMENTATION CODES:   Severe malnutrition in context of social or environmental circumstances  INTERVENTION:  Please obtain height and weight.  Ensure Enlive po TID, each supplement provides 350 kcal and 20 grams of protein  Continue Magic cup TID with meals, each supplement provides 290 kcal and 9 grams of protein  Continue Vitamin B12 po daily  Continue MVI daily  NUTRITION DIAGNOSIS:   Severe Malnutrition related to social / environmental circumstances as evidenced by severe muscle depletion, severe fat depletion, energy intake < or equal to 50% for > or equal to 1 month.  Ongoing  GOAL:   Patient will meet greater than or equal to 90% of their needs  Progressing  MONITOR:   PO intake, Supplement acceptance, Weight trends, Labs, I & O's  REASON FOR ASSESSMENT:   Consult Assessment of nutrition requirement/status  ASSESSMENT:   Pt admitted for CVA. PMH includes HTN and prior CVA (2013).  Pt continues to receive B12 supplementation due to deficiency.   Per SLP, pt remains very impaired and his intake will likely be limited. Pt's diet was changed to full liquids of thin consistency on 9/15. Now that pt's diet has been advanced, will order Ensure Enlive to provide 350 kcals and 20 grams of protein per serving. Will begin order with TID, but RN should encourage additional intake if able. If pt tolerates 3 Ensures per day and has not had his diet advanced, will increase to 4x/day. If pt is unable to meet his nutrition needs via po intake, he will need initiation of nutrition support if it aligns with his GOC.   Meal Completion: Pt ate 100% of 2 full liquid trays on 9/15, providing 1513 kcals and 63 grams protein.   Labs: K+ 3.3 (L) Medications: MVI with iron, Thiamine, Vitamin B12  Diet Order:   Diet Order            Diet NPO time specified  Diet effective ____           Diet full liquid Room service appropriate? No; Fluid  consistency: Thin  Diet effective now                 EDUCATION NEEDS:   Education needs have been addressed  Skin:  Skin Assessment: Reviewed RN Assessment  Last BM:  9/11  Height:   Ht Readings from Last 1 Encounters:  No data found for Ht    Weight:   Wt Readings from Last 1 Encounters:  No data found for Wt    BMI:  There is no height or weight on file to calculate BMI.  Estimated Nutritional Needs:   Kcal:  30-35 kcal/kg  Protein:  1.2-1.5 g/kg  Fluid:  14ml/kcal    Eugene Gavia, MS, RD, LDN RD pager number and weekend/on-call pager number located in Amion.

## 2020-08-07 NOTE — Progress Notes (Addendum)
Inpatient Rehabilitation Admissions Coordinator  Patient at a level with mobility to not need a CIR admit. Swallowing issues persists. I have left second voicemail for his sister to discuss my recommendation for outpatient therapy. I will alert acute team and TOC. We will sign off at this time.  Ottie Glazier, RN, MSN Rehab Admissions Coordinator [phone] 08/07/2020 10:14 AM  I spoke with patient's sister by phone and she in agreement to outpatient therapies.  Ottie Glazier, RN, MSN Rehab Admissions Coordinator [phone] 08/07/2020 11:20 AM

## 2020-08-07 NOTE — Progress Notes (Signed)
Pt did not void the entire shift. He drank at least 360 mL, and only has 80 mL in his bladder.

## 2020-08-07 NOTE — Progress Notes (Signed)
Physical Therapy Treatment Patient Details Name: Adam [name] MRN: [id number] DOB: August 22, 1961 Today's Date: 08/07/2020    History of Present Illness 59 y.o. male with medical history significant for essential hypertension, prior CVA in 2013, seen in [place], who presented to [facility] ED from home with complaints of left arm numbness and drooling. MRI revealed patchy small volume acute right MCA territory infarcts involving the right frontoparietal region and multiple remote lacunar infarcts about the bilateral basal ganglia and right cerebellum.    PT Comments    PT focused session on dynamic balance challenges, pt with no overt LOB but mild unsteadiness noted with multidirectional walking, head turns, and large steps. Pt continues to require verbal cuing for safe gait, mostly for pt to pick feet up bilaterally to avoid toes dragging and tripping. PT recommending OPPT upon d/c from acute setting, per pt his sister and brother-in-law can take him.     Follow Up Recommendations  Supervision for mobility/OOB;Outpatient PT     Equipment Recommendations  Other (comment) (TBD)    Recommendations for Other Services       Precautions / Restrictions Precautions Precautions: Fall Restrictions Weight Bearing Restrictions: No    Mobility  Bed Mobility               General bed mobility comments: up with OT  Transfers                 General transfer comment: Up with OT at start of PT session; repeated sit to stands as intervention with supervision for safety.  Ambulation/Gait Ambulation/Gait assistance: Supervision;Min guard Gait Distance (Feet): 450 Feet   Gait Pattern/deviations: Step-through pattern;Decreased stride length;Ataxic;Shuffle;Narrow base of support;Decreased dorsiflexion - right Gait velocity: decr   General Gait Details: supervision for safety during normal ambulation, min guard when challenging pt's dynamic balance.   Stairs              Wheelchair Mobility    Modified Rankin (Stroke Patients Only) Modified Rankin (Stroke Patients Only) Pre-Morbid Rankin Score: Slight disability Modified Rankin: Moderate disability     Balance Overall balance assessment: Needs assistance Sitting-balance support: No upper extremity supported;Feet supported Sitting balance-Leahy Scale: Good     Standing balance support: No upper extremity supported;During functional activity Standing balance-Leahy Scale: Fair Standing balance comment: accepts mod challenge                            Cognition Arousal/Alertness: Awake/alert Behavior During Therapy: WFL for tasks assessed/performed Overall Cognitive Status: No family/caregiver present to determine baseline cognitive functioning Area of Impairment: Problem solving                             Problem Solving: Requires verbal cues;Slow processing General Comments: difficulty following higher level balance mobility commands      Exercises Other Exercises Other Exercises: Dynamic balance interventions: Backwards walking x10 ft, lateral stepping x10 ft bilaterally, step over obstacles, big stepping, little stepping, SLS x10 seconds bilaterally with UL support    General Comments        Pertinent Vitals/Pain Pain Assessment: No/denies pain    Home Living                      Prior Function            PT Goals (current goals can now be found in the care plan section) Acute  Rehab PT Goals Patient Stated Goal: go home PT Goal Formulation: With patient Time For Goal Achievement: 08/18/20 Potential to Achieve Goals: Good Progress towards PT goals: Progressing toward goals    Frequency    Min 4X/week      PT Plan Discharge plan needs to be updated    Co-evaluation              AM-PAC PT "6 Clicks" Mobility   Outcome Measure  Help needed turning from your back to your side while in a flat bed without using bedrails?:  None Help needed moving from lying on your back to sitting on the side of a flat bed without using bedrails?: None Help needed moving to and from a bed to a chair (including a wheelchair)?: None Help needed standing up from a chair using your arms (e.g., wheelchair or bedside chair)?: None Help needed to walk in hospital room?: A Little Help needed climbing 3-5 steps with a railing? : A Little 6 Click Score: 22    End of Session Equipment Utilized During Treatment: Gait belt Activity Tolerance: Patient tolerated treatment well Patient left: in bed;with call bell/phone within reach (sitting EOB in ED ) Nurse Communication: Mobility status PT Visit Diagnosis: Unsteadiness on feet (R26.81);Difficulty in walking, not elsewhere classified (R26.2)     Time: 1210-1223 PT Time Calculation (min) (ACUTE ONLY): 13 min  Charges:  $Neuromuscular Re-education: 8-22 mins                    Adam [name] E, PT Acute Rehabilitation Services Pager [phone]  Office [phone]    Adam [name] D Adam [name] 08/07/2020, 2:38 PM

## 2020-08-07 NOTE — Progress Notes (Signed)
STROKE TEAM PROGRESS NOTE    INTERVAL HISTORY His sister is  at the bedside.  Patient continues to have excessive drooling and difficulty swallowing.  The patient sister confirms that he had previous strokes but apparently did not have any swallowing problems.  Hemoglobin A1c is 5.7.  Urine drug screen is yet pending.   OBJECTIVE Vitals:   08/06/20 0803 08/06/20 1615 08/06/20 2130 08/07/20 0724  BP: (!) 156/117 (!) 154/125 (!) 138/109 (!) 131/112  Pulse: 94 88 91 (!) 107  Resp: 16 18  16   Temp:  98.4 F (36.9 C) 98.6 F (37 C) 98.3 F (36.8 C)  TempSrc:   Oral   SpO2: 100% 100% 99% 97%    CBC:  Recent Labs  Lab 08/03/20 1427 08/03/20 1438  WBC 5.2  --   NEUTROABS 3.1  --   HGB 13.6 13.9  HCT 41.0 41.0  MCV 74.5*  --   PLT 207  --     Basic Metabolic Panel:  Recent Labs  Lab 08/03/20 1427 08/03/20 1438  NA 142 145  K 3.6 3.7  CL 107 109  CO2 21*  --   GLUCOSE 87 82  BUN 29* 30*  CREATININE 1.10 0.90  CALCIUM 9.7  --     Lipid Panel:     Component Value Date/Time   CHOL 123 08/04/2020 1142   TRIG 49 08/04/2020 1142   HDL 33 (L) 08/04/2020 1142   CHOLHDL 3.7 08/04/2020 1142   VLDL 10 08/04/2020 1142   LDLCALC 80 08/04/2020 1142   HgbA1c:  Lab Results  Component Value Date   HGBA1C 5.7 (H) 08/06/2020   Urine Drug Screen: No results found for: LABOPIA, COCAINSCRNUR, LABBENZ, AMPHETMU, THCU, LABBARB  Alcohol Level     Component Value Date/Time   ETH <10 08/03/2020 2354    IMAGING  CT HEAD CT Angio Head W or Wo Contrast CT ANGIO NECK W OR WO CONTRAST 08/03/2020 IMPRESSION:   CT head:  1. Subcentimeter age-indeterminate infarct within the right cerebellum.  2. Multiple chronic appearing lacunar infarcts within the bilateral basal ganglia.  3. Background mild generalized parenchymal atrophy with advanced chronic small vessel ischemic disease.   CTA neck:  The common carotid, internal carotid and vertebral arteries are patent within the neck  without hemodynamically significant stenosis.   CTA head:  1. No definite proximal right M2 branch occlusion is identified. However, there is a paucity of visualized proximal M2 superior division right MCA branch vessels. If there is concern for an acute right MCA territory stroke, consider catheter based angiography for further evaluation.  2. Intracranial atherosclerotic disease with multifocal stenoses, most notably as follows.  3. Moderate stenosis within the distal M1 right middle cerebral artery.  4. High-grade focal stenosis within the distal A2/proximal A3 left anterior cerebral artery.  5. High-grade focal stenosis within the P2 left PCA.  6. High-grade stenoses within the P4 right PCA.   MR Brain W and Wo Contrast 08/04/2020 IMPRESSION:  1. Patchy small volume acute right MCA territory infarcts involving the right frontoparietal region as above. No associated hemorrhage or mass effect.  2. Underlying advanced cerebral atrophy and chronic microvascular ischemic disease for age, with multiple remote lacunar infarcts about the bilateral basal ganglia and right cerebellum.   Transthoracic Echocardiogram  Normal ejection fraction 60 to 65%.  No cardiac source of embolism  ECG - SR rate 82 BPM. (See cardiology reading for complete details)  PHYSICAL EXAM Blood pressure (!) 131/112, pulse 10/04/2020)  107, temperature 98.3 F (36.8 C), resp. rate 16, SpO2 97 %. Frail unkempt malnourished looking African-American male not in distress. . Afebrile. Head is nontraumatic. Neck is supple without bruit.    Cardiac exam no murmur or gallop. Lungs are clear to auscultation. Distal pulses are well felt. Neurological Exam  Awake alert oriented to place and person    Diminished attention, registration and recall.  Mildly dysarthric speech.  No aphasia.  Jaw jerk is brisk.  Weak cough and gag.  Excessive drooling of saliva.  Able to move tongue well in all directions.  Follows commands well.  Extraocular  movements are full range without nystagmus.  Blinks to threat bilaterally.  Bifacial weakness right greater than left lower half.  Tongue deviates to the right on protrusion.  Motor system exam shows no upper or lower extremity drift but mild proximal weakness bilaterally right greater than left.  Mild ankle dorsiflexor weakness in the right.  Reflexes are brisk bilaterally.  Plantars were both equivocal.  Patchy sensory loss in the left chest and medial forearm but not in a stroke distribution.  ASSESSMENT/PLAN [name] is a 59 y.o. male with history of hypertension, prior stroke (residual right-sided weakness and dysarthria), active smoking (reports 1 cigarette/day) presenting with acute onset drooling, patchy left arm and torso sensory changes, headache, cough, bifacial weakness, significantly worsened speech and gait with recent weight loss. He did not receive IV t-PA due to late presentation (>4.5 hours from time of onset).    Stroke: acute right MCA territory infarcts involving the right frontoparietal region with subcentimeter age-indeterminate infarct within the right cerebellum - embolic - source unknown.  Versus intracranial atherosclerosis   resultant increased drooling and mild left-sided weakness, significant drooling and swallowing difficulties unclear whether this represents swallowing apraxia or pseudobulbar state related dysphagia  code Stroke CT Head - not ordered  CT head - Subcentimeter age-indeterminate infarct within the right cerebellum. Multiple chronic appearing lacunar infarcts within the bilateral basal ganglia.   MRI head - Patchy small volume acute right MCA territory infarcts involving the right frontoparietal region as above. Underlying advanced cerebral atrophy and chronic microvascular ischemic disease for age, with multiple remote lacunar infarcts about the bilateral basal ganglia and right cerebellum.   MRA head - not ordered  CTA Head - High-grade focal  stenosis within the distal A2/proximal A3 left anterior cerebral artery. High-grade focal stenosis within the P2 left PCA. High-grade stenoses within the P4 right PCA.   CTA Neck - No hemodynamically significant stenosis.   CT Perfusion - not ordered  Carotid Doppler - CTA neck ordered - carotid dopplers not indicated.  2D Echo -normal ejection fraction 60 to 65%.  Sars Corona Virus 2 - negative  LDL -80 mg percent  HgbA1c - pending  UDS - pending  VTE prophylaxis - Lovenox Diet  Diet Order            DIET - DYS 1 Room service appropriate? No; Fluid consistency: Honey Thick  Diet effective now                 No antithrombotic prior to admission, now on aspirin 81 mg daily  Patient counseled to be compliant with his antithrombotic medications  Ongoing aggressive stroke risk factor management  Therapy recommendations:  pending  Disposition:  Pending  Hypertension  Home BP meds: none  Current BP meds: apresoline prn  Stable . Permissive hypertension (OK if < 220/120) but gradually normalize in 5-7 days  .  Long-term BP goal normotensive  Hyperlipidemia  Home Lipid lowering medication: none   LDL 80 mg percent, goal < 70  Current lipid lowering medication: Lipitor 80 mg daily  Continue statin at discharge  Other Stroke Risk Factors  Cigarette smoker - advised to stop smoking  Previous ETOH use  Obesity, There is no height or weight on file to calculate BMI., recommend weight loss, diet and exercise as appropriate   Family hx stroke - not on file  Hx stroke/TIA  Previous substance abuse  Other Active Problems  Code status - Full code  NPO  Mild bradycardia - 50's  Hospital day # 3 Patient has presented with embolic right MCA branch infarct etiology unclear as to intracranial sclerosis or central cardiac or aortic arch source.  Recommend continue ongoing work-up check urine drug screen and hemoglobin A1c.  Continue aspirin Plavix for 3 weeks  followed by aspirin alone.  Replace low vitamin B12 as per primary team.  May need TEE/loop recorder later.   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  Hopefully this will improve in the next few days otherwise will need a feeding tube for medicines and food.  Discussed with speech therapist and patient sister and answered questions.Marland Kitchen greater than 50% time during this 25-minute visit were spent in counseling and coordination of care about embolic stroke and discussion about evaluation treatment plan and answered questions. Delia Heady, MD To contact Stroke Continuity provider, please refer to [email or web address]. After hours, contact General Neurology

## 2020-08-07 NOTE — Progress Notes (Signed)
[facility]  PROGRESS NOTE  Adam [name] UJW:[id number] DOB: 1961-11-09 DOA: 08/03/2020 PCP: Patient, [name] Admit date - 08/03/2020   Admitting Physician Darlin Drop, DO  Outpatient Primary MD for the patient is Patient, [name]  LOS - 3 Brief Narrative   Adam [name] is a 59 y.o. year old male with medical history significant for HTN, prior stroke (residual right-sided weakness and dysarthria) who presented on 08/03/2020 with acute onset drooling, left arm/torso sensory changes, headache, bifacial weakness and worsening speech and gait and was found to have acute right MCA territory infarcts.CTA of head showed high-grade focal distal stenosis within the distal A2/proximal A2 left anterior cerebral artery with high-grade focal stenosis in the P2 left PCA high-grade stenosis within the P4 right PCA.  CTA neck showed no significant stenosis.  TTE showed preserved EF.  LDL 80, A1c 5.7  Hospital course complicated by persistent drooling and decreased p.o. intake  Subjective  Today he is working with speech therapy at bedside  A & P  Acute right MCA territory infarcts, embolic event unclear source currently -N.p.o. at midnight for planned TEE -Does have some left-sided weakness but able to mobilize well and does not currently need CIR, was able to discharge home we will set up for home health PT-continue aspirin, Plavix x3 weeks followed by aspirin alone per neurology-continue high intensity level statin   Increased drooling, unclear if this is residual deficit of recent stroke/apraxia or pseudogout bulbar state related to dysphagia.  Currently having difficulty tolerating p.o. due to significant inability to control oral secretions -Continue intensive speech therapy while inpatient -Downgrade diet from dysphagia 1 to liquid, if unable to tolerate p.o. intake may warrant feeding tube  B12 deficiency.  The setting of severe calorie protein malnutrition B12 90 -continue B12,  thiamine, multivitamin supplementation  Hypertension, not at goal but improving.  SBP's in the 140s but DBP's remain greater than 100 -Check TSH given diastolic hypertension -Started on lisinopril 10 mg on 9/15, will hold off on increasing given creatinine currently 1.3 (prior baseline 0.9-1.1) -Continue HCTZ 25 mg -Add amlodipine 5 mg   AKI, mild Baseline creatinine 0.9-1.  Currently 1.3.  Could be related to the recently started lisinopril the last 24 hours.  Could also related to decreased oral intake related to persistent drooling/inability to tolerate oral secretions -Monitor BMP -Avoid nephrotoxins  Hypokalemia, mild.  Patient is on HCTZ -Replete orally as allowed -Monitor BMP    Family Communication  : Updated sister  Code Status : Full  Disposition Plan  :  Patient is from home. Anticipated d/c date:  1 to 2 days. Barriers to d/c or necessity for inpatient status:  TEE for evaluation of embolic event leading to CVA, close monitor p.o. intake given inability to maintain oral secretions and may warrant feeding tube Consults  : Neurology  Procedures  : TTE, 9/16  DVT Prophylaxis  :  Lovenox   Lab Results  Component Value Date   PLT 207 08/03/2020    Diet :  Diet Order            Diet full liquid Room service appropriate? No; Fluid consistency: Thin  Diet effective now                  Inpatient Medications Scheduled Meds: . aspirin EC  81 mg Oral Daily   Or  . aspirin  300 mg Rectal Daily  . atorvastatin  80 mg Oral Daily  . clopidogrel  75 mg Oral Daily  . enoxaparin (LOVENOX) injection  40 mg Subcutaneous Daily  . hydrochlorothiazide  25 mg Oral Daily  . lisinopril  10 mg Oral Daily  . multivitamins with iron  1 tablet Oral Daily  . thiamine  100 mg Oral Daily  . vitamin B-12  1,000 mcg Oral Daily   Continuous Infusions: PRN Meds:.hydrALAZINE, Resource ThickenUp Clear  Antibiotics  :   Anti-infectives (From admission, onward)   None        Objective   Vitals:   08/06/20 1615 08/06/20 2130 08/07/20 0724 08/07/20 1550  BP: (!) 154/125 (!) 138/109 (!) 131/112 (!) 148/114  Pulse: 88 91 (!) 107 92  Resp: Temp: 98.4 F (36.9 C) 98.6 F (37 C) 98.3 F (36.8 C) 97.8 F (36.6 C)  TempSrc:  Oral    SpO2: 100% 99% 97% 97%    SpO2: 97 %  Wt Readings from Last 3 Encounters:  No data found for Wt     Intake/Output Summary (Last 24 hours) at 08/07/2020 1629 Last data filed at 08/07/2020 1130 Gross per 24 hour  Intake 480 ml  Output --  Net 480 ml    Physical Exam:     Awake Alert, Oriented X 3,  Difficulty speaking given persistent drooling No new F.N deficits,  [place].AT, Normal respiratory effort on room air, CTAB RRR,No Gallops,Rubs or new Murmurs,  +ve B.Sounds, Abd Soft, No tenderness, No rebound, guarding or rigidity. No Cyanosis, No new Rash or bruise   I have personally reviewed the following:   Data Reviewed:  CBC Recent Labs  Lab 08/03/20 1427 08/03/20 1438  WBC 5.2  --   HGB 13.6 13.9  HCT 41.0 41.0  PLT 207  --   MCV 74.5*  --   MCH 24.7*  --   MCHC 33.2  --   RDW 15.5  --   LYMPHSABS 1.7  --   MONOABS 0.4  --   EOSABS 0.0  --   BASOSABS 0.0  --     Chemistries  Recent Labs  Lab 08/03/20 1427 08/03/20 1438 08/07/20 1101  NA 142 145 144  K 3.6 3.7 3.4*  CL 107 109 107  CO2 21*  --  24  GLUCOSE 87 82 105*  BUN 29* 30* 39*  CREATININE 1.10 0.90 1.30*  CALCIUM 9.7  --  10.4*  AST 16  --   --   ALT 11  --   --   ALKPHOS 76  --   --   BILITOT 0.8  --   --    ------------------------------------------------------------------------------------------------------------------ No results for input(s): CHOL, HDL, LDLCALC, TRIG, CHOLHDL, LDLDIRECT in the last 72 hours.  Lab Results  Component Value Date   HGBA1C 5.7 (H) 08/06/2020   ------------------------------------------------------------------------------------------------------------------ Recent Labs     08/07/20 1101  TSH 1.347   ------------------------------------------------------------------------------------------------------------------ No results for input(s): VITAMINB12, FOLATE, FERRITIN, TIBC, IRON, RETICCTPCT in the last 72 hours.  Coagulation profile Recent Labs  Lab 08/03/20 1427  INR 1.1    No results for input(s): DDIMER in the last 72 hours.  Cardiac Enzymes No results for input(s): CKMB, TROPONINI, MYOGLOBIN in the last 168 hours.  Invalid input(s): CK ------------------------------------------------------------------------------------------------------------------ No results found for: BNP  Micro Results Recent Results (from the past 240 hour(s))  SARS Coronavirus 2 by RT PCR (hospital order, performed in [facility] hospital lab) Nasopharyngeal Nasopharyngeal Swab     Status: None   Collection Time: 08/04/20  3:22 AM   Specimen: Nasopharyngeal Swab  Result Value Ref Range Status   SARS Coronavirus 2 NEGATIVE NEGATIVE Final    Comment: (NOTE) SARS-CoV-2 target nucleic acids are NOT DETECTED.  The SARS-CoV-2 RNA is generally detectable in upper and lower respiratory specimens during the acute phase of infection. The lowest concentration of SARS-CoV-2 viral copies this assay can detect is 250 copies / mL. A negative result does not preclude SARS-CoV-2 infection and should not be used as the sole basis for treatment or other patient management decisions.  A negative result may occur with improper specimen collection / handling, submission of specimen other than nasopharyngeal swab, presence of viral mutation(s) within the areas targeted by this assay, and inadequate number of viral copies (<250 copies / mL). A negative result must be combined with clinical observations, patient history, and epidemiological information.  Fact Sheet for Patients:   BoilerBrush.com.cy  Fact Sheet for Healthcare  Providers: https://pope.com/  This test is not yet approved or  cleared by the [place] FDA and has been authorized for detection and/or diagnosis of SARS-CoV-2 by FDA under an Emergency Use Authorization (EUA).  This EUA will remain in effect (meaning this test can be used) for the duration of the COVID-19 declaration under Section 564(b)(1) of the Act, 21 U.S.C. section 360bbb-3(b)(1), unless the authorization is terminated or revoked sooner.  Performed at [facility], [address], [place], [place] [address]     Radiology Reports CT Angio Head W or [place] Contrast  Result Date: 08/03/2020 CLINICAL DATA:  Neuro deficit, acute, stroke suspected. Additional history provided: Numbness yesterday. Drooling. EXAM: CT ANGIOGRAPHY HEAD AND NECK TECHNIQUE: Multidetector CT imaging of the head and neck was performed using the standard protocol during bolus administration of intravenous contrast. Multiplanar CT image reconstructions and MIPs were obtained to evaluate the vascular anatomy. Carotid stenosis measurements (when applicable) are obtained utilizing NASCET criteria, using the distal internal carotid diameter as the denominator. CONTRAST:  75mL OMNIPAQUE IOHEXOL 350 MG/ML SOLN COMPARISON:  No pertinent prior exams are available for comparison. FINDINGS: CT HEAD FINDINGS Brain: Mild generalized parenchymal atrophy. There are multiple chronic appearing lacunar infarcts within the basal ganglia bilaterally. Background advanced ill-defined hypoattenuation within the cerebral white matter is nonspecific, but consistent with chronic small vessel ischemic disease. Subcentimeter age-indeterminate infarct within right cerebellar hemisphere (series 5, image 13). There is no acute intracranial hemorrhage. No demarcated cortical infarct is identified. No extra-axial fluid collection. No evidence of intracranial mass. No midline shift. Vascular: Reported below. Skull: Normal.  Negative for fracture or focal lesion. Sinuses: No significant paranasal sinus disease or mastoid effusion at the imaged levels. Orbits: No acute finding. Chronic fracture deformity of the right lamina papyracea. Review of the MIP images confirms the above findings CTA NECK FINDINGS Aortic arch: Common origin of the innominate and left common carotid arteries. The visualized aortic arch is unremarkable. No hemodynamically significant innominate or proximal subclavian artery stenosis. Right carotid system: CCA and ICA patent within the neck without stenosis. Left carotid system: CCA and ICA patent within the neck without stenosis. Vertebral arteries: Codominant and patent within the neck without stenosis Skeleton: No acute bony abnormality or aggressive osseous lesion. Cervical spondylosis with multilevel disc space narrowing, disc bulges, uncovertebral and facet hypertrophy. Other neck: No neck mass or cervical lymphadenopathy. Thyroid unremarkable. Upper chest: No consolidation within the imaged lung apices. Review of the MIP images confirms the above findings CTA HEAD FINDINGS Anterior circulation: The intracranial internal carotid arteries  are patent. The M1 middle cerebral arteries are patent. There is moderate stenosis within the distal M1 right middle cerebral artery. No definite right M2 proximal branch occlusion is identified. However, there is a paucity of visualized proximal superior division right M2 MCA branches. The right anterior cerebral artery is patent without significant proximal stenosis. There is a high-grade focal stenosis within the distal A2/proximal A3 left anterior cerebral artery. No intracranial aneurysm is identified. Posterior circulation: The intracranial vertebral arteries are patent. The basilar artery is patent. The posterior cerebral arteries are patent. There is a high-grade focal stenosis within the P2 left PCA. Additionally, there are multifocal high-grade stenoses within the P4  right PCA. Posterior communicating arteries are present bilaterally. Venous sinuses: Within limitations of contrast timing, no convincing thrombus. Anatomic variants: None significant. Review of the MIP images confirms the above findings These results were called by telephone at the time of interpretation on 08/03/2020 at 8:28 pm to provider [facility] , who verbally acknowledged these results. IMPRESSION: CT head: 1. Subcentimeter age-indeterminate infarct within the right cerebellum. 2. Multiple chronic appearing lacunar infarcts within the bilateral basal ganglia. 3. Background mild generalized parenchymal atrophy with advanced chronic small vessel ischemic disease. CTA neck: The common carotid, internal carotid and vertebral arteries are patent within the neck without hemodynamically significant stenosis. CTA head: 1. No definite proximal right M2 branch occlusion is identified. However, there is a paucity of visualized proximal M2 superior division right MCA branch vessels. If there is concern for an acute right MCA territory stroke, consider catheter based angiography for further evaluation. 2. Intracranial atherosclerotic disease with multifocal stenoses, most notably as follows. 3. Moderate stenosis within the distal M1 right middle cerebral artery. 4. High-grade focal stenosis within the distal A2/proximal A3 left anterior cerebral artery. 5. High-grade focal stenosis within the P2 left PCA. 6. High-grade stenoses within the P4 right PCA. Electronically Signed   By: Jackey Loge DO   On: 08/03/2020 20:29   CT ANGIO NECK W OR WO CONTRAST  Result Date: 08/03/2020 CLINICAL DATA:  Neuro deficit, acute, stroke suspected. Additional history provided: Numbness yesterday. Drooling. EXAM: CT ANGIOGRAPHY HEAD AND NECK TECHNIQUE: Multidetector CT imaging of the head and neck was performed using the standard protocol during bolus administration of intravenous contrast. Multiplanar CT image reconstructions and  MIPs were obtained to evaluate the vascular anatomy. Carotid stenosis measurements (when applicable) are obtained utilizing NASCET criteria, using the distal internal carotid diameter as the denominator. CONTRAST:  40mL OMNIPAQUE IOHEXOL 350 MG/ML SOLN COMPARISON:  No pertinent prior exams are available for comparison. FINDINGS: CT HEAD FINDINGS Brain: Mild generalized parenchymal atrophy. There are multiple chronic appearing lacunar infarcts within the basal ganglia bilaterally. Background advanced ill-defined hypoattenuation within the cerebral white matter is nonspecific, but consistent with chronic small vessel ischemic disease. Subcentimeter age-indeterminate infarct within right cerebellar hemisphere (series 5, image 13). There is no acute intracranial hemorrhage. No demarcated cortical infarct is identified. No extra-axial fluid collection. No evidence of intracranial mass. No midline shift. Vascular: Reported below. Skull: Normal. Negative for fracture or focal lesion. Sinuses: No significant paranasal sinus disease or mastoid effusion at the imaged levels. Orbits: No acute finding. Chronic fracture deformity of the right lamina papyracea. Review of the MIP images confirms the above findings CTA NECK FINDINGS Aortic arch: Common origin of the innominate and left common carotid arteries. The visualized aortic arch is unremarkable. No hemodynamically significant innominate or proximal subclavian artery stenosis. Right carotid system: CCA and ICA patent within  the neck without stenosis. Left carotid system: CCA and ICA patent within the neck without stenosis. Vertebral arteries: Codominant and patent within the neck without stenosis Skeleton: No acute bony abnormality or aggressive osseous lesion. Cervical spondylosis with multilevel disc space narrowing, disc bulges, uncovertebral and facet hypertrophy. Other neck: No neck mass or cervical lymphadenopathy. Thyroid unremarkable. Upper chest: No consolidation  within the imaged lung apices. Review of the MIP images confirms the above findings CTA HEAD FINDINGS Anterior circulation: The intracranial internal carotid arteries are patent. The M1 middle cerebral arteries are patent. There is moderate stenosis within the distal M1 right middle cerebral artery. No definite right M2 proximal branch occlusion is identified. However, there is a paucity of visualized proximal superior division right M2 MCA branches. The right anterior cerebral artery is patent without significant proximal stenosis. There is a high-grade focal stenosis within the distal A2/proximal A3 left anterior cerebral artery. No intracranial aneurysm is identified. Posterior circulation: The intracranial vertebral arteries are patent. The basilar artery is patent. The posterior cerebral arteries are patent. There is a high-grade focal stenosis within the P2 left PCA. Additionally, there are multifocal high-grade stenoses within the P4 right PCA. Posterior communicating arteries are present bilaterally. Venous sinuses: Within limitations of contrast timing, no convincing thrombus. Anatomic variants: None significant. Review of the MIP images confirms the above findings These results were called by telephone at the time of interpretation on 08/03/2020 at 8:28 pm to provider [facility] , who verbally acknowledged these results. IMPRESSION: CT head: 1. Subcentimeter age-indeterminate infarct within the right cerebellum. 2. Multiple chronic appearing lacunar infarcts within the bilateral basal ganglia. 3. Background mild generalized parenchymal atrophy with advanced chronic small vessel ischemic disease. CTA neck: The common carotid, internal carotid and vertebral arteries are patent within the neck without hemodynamically significant stenosis. CTA head: 1. No definite proximal right M2 branch occlusion is identified. However, there is a paucity of visualized proximal M2 superior division right MCA branch  vessels. If there is concern for an acute right MCA territory stroke, consider catheter based angiography for further evaluation. 2. Intracranial atherosclerotic disease with multifocal stenoses, most notably as follows. 3. Moderate stenosis within the distal M1 right middle cerebral artery. 4. High-grade focal stenosis within the distal A2/proximal A3 left anterior cerebral artery. 5. High-grade focal stenosis within the P2 left PCA. 6. High-grade stenoses within the P4 right PCA. Electronically Signed   By: Jackey Loge DO   On: 08/03/2020 20:29   MR Brain W and [place] Contrast  Result Date: 08/04/2020 CLINICAL DATA:  Initial evaluation for neuro deficit, stroke suspected. EXAM: MRI HEAD WITHOUT AND WITH CONTRAST TECHNIQUE: Multiplanar, multiecho pulse sequences of the brain and surrounding structures were obtained without and with intravenous contrast. CONTRAST:  6mL GADAVIST GADOBUTROL 1 MMOL/ML IV SOLN COMPARISON:  Comparison made with prior CTA from 08/03/2020. FINDINGS: Brain: Advanced cerebral atrophy for age. Patchy and confluent T2/FLAIR hyperintensity within the periventricular deep white matter both cerebral hemispheres, most consistent with chronic microvascular ischemic disease, advanced for age. Multiple scattered remote lacunar infarcts present about the bilateral basal ganglia. Associated chronic hemosiderin staining noted about several of these infarcts. Additionally, there are multiple small remote right cerebellar infarcts. Patchy small volume restricted diffusion seen involving the cortical and subcortical right frontal parietal region, consistent with acute right MCA territory infarcts. Involvement of the pre and postcentral gyri noted. Largest area of infarction seen at the postcentral gyrus and measures up to 1.6 cm in diameter. No associated hemorrhage  or mass effect. Gray-white matter differentiation otherwise maintained. No acute intracranial hemorrhage elsewhere within the brain. No mass  lesion, midline shift or mass effect. Ventricles normal size without hydrocephalus. No extra-axial fluid collection. Pituitary gland and suprasellar region within normal limits. No visible parenchymal signal abnormality seen about the medulla or brainstem with thin section imaging. No abnormal enhancement. Vascular: Major intracranial vascular flow voids are maintained. Skull and upper cervical spine: Craniocervical junction within normal limits. Bone marrow signal intensity within normal limits. No focal marrow replacing lesion. No scalp soft tissue abnormality. Sinuses/Orbits: Globes and orbital soft tissues demonstrate no acute finding. Mild scattered mucosal thickening noted within the sphenoid ethmoidal sinuses. No mastoid effusion. Inner ear structures grossly normal. Other: None. IMPRESSION: 1. Patchy small volume acute right MCA territory infarcts involving the right frontoparietal region as above. No associated hemorrhage or mass effect. 2. Underlying advanced cerebral atrophy and chronic microvascular ischemic disease for age, with multiple remote lacunar infarcts about the bilateral basal ganglia and right cerebellum. Electronically Signed   By: Rise MuBenjamin  McClintock M.D.   On: 08/04/2020 02:18   DG Swallowing Func-Speech Pathology  Result Date: 08/05/2020 Objective Swallowing Evaluation: Type of Study: MBS-Modified Barium Swallow Study  Patient Details Name: Adam KussmaulClinton [name] MRN: [id number] Date of Birth: 08/18/1961 Today's Date: 08/05/2020 Time: SLP Start Time (ACUTE ONLY): [id number] -SLP Stop Time (ACUTE ONLY): 0902 SLP Time Calculation (min) (ACUTE ONLY): 23 min Past Medical History: No past medical history on file. Past Surgical History: No past surgical history on file. HPI: Patient is a 59 y.o. 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 Per chart review, his  sister had reported she noticed bifacial weakness, significant worsened speech and significantly worsened gait. MRI brain revealed patchy small volume acute right MCA territory infarcts involving right frontoparietal region but no associated hemorrhage or mass effect, as well as underlying advanced cerebral atrophy and chronic microvascular ischemic disease, multiple remote lacunar infarcts about the basal ganglia and right cerebellum.  Assessment / Plan / Recommendation CHL IP CLINICAL IMPRESSIONS 08/05/2020 Clinical Impression Pt presents with oropharyngeal dysphagia characterized by reduced labial seal, impaired bolus propulsion, impaired bolus cohesion, reduced lingual retraction, reduced anterior laryngeal movement, and a pharyngeal delay. He demonstrated anterior spillage, oral holding, lingual pumping, piecemeal deglutition, premature spillage, vallecular residue, pyriform sinus residue, penetration (PAS 3) of thin liquids and silent aspiration (PAS 8) of nectar thick liquids secondary to the pharyngeal delay. Pt's most significant impairment is oral holding with it sometimes being over a minute in length. Pt required verbal and tactile cues to initiate a swallow and anterior spillage was still noted intermittently when pt attempted to swallow.  Pt's swallow was most timely with with puree solids and honey thick liquids via cup. Amount of pharyngeal residue increased with bolus sizes. 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 Pt is safest with his current diet of dysphagia 1 (puree) solids and honey thick liquids. However, considering the level of support required for swallowing of limited boluses during the study, it is anticipated that may not be able to maintain adequate nutrition or hydration p.o. and that supplemental nonoral nutrition may be needed in the future. SLP will follow for dysphagia treatment.  SLP Visit Diagnosis Dysphagia, oropharyngeal phase  (R13.12) Attention and concentration deficit following -- Frontal lobe and executive function deficit following -- Impact on safety and  function Mild aspiration risk;Moderate aspiration risk   CHL IP TREATMENT RECOMMENDATION 08/05/2020 Treatment Recommendations Therapy as outlined in treatment plan below   Prognosis 08/05/2020 Prognosis for Safe Diet Advancement Good Barriers to Reach Goals Cognitive deficits;Severity of deficits Barriers/Prognosis Comment -- CHL IP DIET RECOMMENDATION 08/05/2020 SLP Diet Recommendations Honey thick liquids;Dysphagia 1 (Puree) solids Liquid Administration via Cup;Straw Medication Administration Crushed with puree Compensations Slow rate;Small sips/bites;Minimize environmental distractions;Follow solids with liquid;Other (Comment) Postural Changes Remain semi-upright after after feeds/meals (Comment)   [facility] IP OTHER RECOMMENDATIONS 08/05/2020 Recommended Consults -- Oral Care Recommendations Oral care BID Other Recommendations --   [facility] IP FOLLOW UP RECOMMENDATIONS 08/05/2020 Follow up Recommendations Inpatient Rehab   [facility] IP FREQUENCY AND DURATION 08/05/2020 Speech Therapy Frequency (ACUTE ONLY) min 2x/week Treatment Duration 2 weeks      [facility] IP ORAL PHASE 08/05/2020 Oral Phase Impaired Oral - Pudding Teaspoon -- Oral - Pudding Cup -- Oral - Honey Teaspoon -- Oral - Honey Cup Reduced posterior propulsion;Holding of bolus;Piecemeal swallowing;Right anterior bolus loss;Left anterior bolus loss;Lingual pumping;Delayed oral transit;Decreased bolus cohesion;Premature spillage Oral - Nectar Teaspoon -- Oral - Nectar Cup Reduced posterior propulsion;Holding of bolus;Piecemeal swallowing;Right anterior bolus loss;Left anterior bolus loss;Lingual pumping;Delayed oral transit;Decreased bolus cohesion;Premature spillage Oral - Nectar Straw Reduced posterior propulsion;Holding of bolus;Piecemeal swallowing;Right anterior bolus loss;Left anterior bolus loss;Lingual pumping;Delayed oral transit;Decreased  bolus cohesion;Premature spillage Oral - Thin Teaspoon -- Oral - Thin Cup Reduced posterior propulsion;Holding of bolus;Piecemeal swallowing;Right anterior bolus loss;Left anterior bolus loss;Lingual pumping;Delayed oral transit;Decreased bolus cohesion;Premature spillage Oral - Thin Straw Reduced posterior propulsion;Holding of bolus;Piecemeal swallowing;Right anterior bolus loss;Left anterior bolus loss;Lingual pumping;Delayed oral transit;Decreased bolus cohesion;Premature spillage Oral - Puree Reduced posterior propulsion;Holding of bolus;Piecemeal swallowing;Right anterior bolus loss;Left anterior bolus loss;Lingual pumping;Delayed oral transit;Decreased bolus cohesion;Premature spillage Oral - Mech Soft -- Oral - Regular -- Oral - Multi-Consistency -- Oral - Pill -- Oral Phase - Comment --  CHL IP PHARYNGEAL PHASE 08/05/2020 Pharyngeal Phase Impaired Pharyngeal- Pudding Teaspoon -- Pharyngeal -- Pharyngeal- Pudding Cup -- Pharyngeal -- Pharyngeal- Honey Teaspoon Reduced anterior laryngeal mobility;Delayed swallow initiation-vallecula;Delayed swallow initiation-pyriform sinuses;Pharyngeal residue - valleculae;Pharyngeal residue - pyriform;Reduced tongue base retraction Pharyngeal -- Pharyngeal- Honey Cup -- Pharyngeal -- Pharyngeal- Nectar Teaspoon -- Pharyngeal -- Pharyngeal- Nectar Cup Reduced anterior laryngeal mobility;Delayed swallow initiation-vallecula;Delayed swallow initiation-pyriform sinuses;Pharyngeal residue - valleculae;Pharyngeal residue - pyriform;Reduced tongue base retraction;Penetration/Aspiration before swallow Pharyngeal Material enters airway, passes BELOW cords without attempt by patient to eject out (silent aspiration) Pharyngeal- Nectar Straw Reduced anterior laryngeal mobility;Delayed swallow initiation-vallecula;Delayed swallow initiation-pyriform sinuses;Pharyngeal residue - valleculae;Pharyngeal residue - pyriform;Reduced tongue base retraction Pharyngeal -- Pharyngeal- Thin Teaspoon  -- Pharyngeal -- Pharyngeal- Thin Cup Reduced anterior laryngeal mobility;Delayed swallow initiation-vallecula;Delayed swallow initiation-pyriform sinuses;Pharyngeal residue - valleculae;Pharyngeal residue - pyriform;Reduced tongue base retraction;Penetration/Aspiration during swallow Pharyngeal Material enters airway, remains ABOVE vocal cords and not ejected out Pharyngeal- Thin Straw Reduced anterior laryngeal mobility;Delayed swallow initiation-vallecula;Delayed swallow initiation-pyriform sinuses;Pharyngeal residue - valleculae;Pharyngeal residue - pyriform;Reduced tongue base retraction Pharyngeal -- Pharyngeal- Puree Reduced anterior laryngeal mobility;Delayed swallow initiation-vallecula;Delayed swallow initiation-pyriform sinuses;Pharyngeal residue - valleculae;Pharyngeal residue - pyriform;Reduced tongue base retraction Pharyngeal -- Pharyngeal- Mechanical Soft -- Pharyngeal -- Pharyngeal- Regular -- Pharyngeal -- Pharyngeal- Multi-consistency -- Pharyngeal -- Pharyngeal- Pill -- Pharyngeal -- Pharyngeal Comment --  No flowsheet data found. Shanika I. Vear Clock, MS, CCC-SLP Acute Rehabilitation Services Office number [phone] Pager [phone] Scheryl Marten 08/05/2020, 10:11 AM              VAS [place] TRANSCRANIAL DOPPLER W BUBBLES  Result Date: 08/06/2020  Transcranial Doppler with Bubble Indications: Stroke. Comparison Study: No prior study Performing Technologist: Gertie Fey MHA, RDMS, RVT, RDCS  Examination Guidelines: A complete evaluation includes B-mode imaging, spectral Doppler, color Doppler, and power Doppler as needed of all accessible portions of each vessel. Bilateral testing is considered an integral part of a complete examination. Limited examinations for reoccurring indications may be performed as noted.  Summary: No HITS at rest or during Valsalva. Negative transcranial Doppler Bubble study with no evidence of right to left intracardiac communication.  A vascular evaluation  was performed. The right middle cerebral artery was studied. An IV was inserted into the patient's right forearm. Verbal informed consent was obtained.  Negative TCD Bubble study *See table(s) above for TCD measurements and observations.  Diagnosing physician: Delia Heady MD Electronically signed by Delia Heady MD on 08/06/2020 at 12:32:56 PM.    Final    ECHOCARDIOGRAM COMPLETE BUBBLE STUDY  Result Date: 08/04/2020    ECHOCARDIOGRAM REPORT   Patient Name:   Adam [name] Date of Exam: 08/04/2020 Medical Rec #:  [id number]      Height: Accession #:    [id number]     Weight: Date of Birth:  [birth year]      BSA: Patient Age:    58 years       BP:           171/70 mmHg Patient Gender: M              HR:           55 bpm. Exam Location:  Inpatient Procedure: 2D Echo Indications:    Stroke 434.91 / I63.9  History:        Patient has no prior history of Echocardiogram examinations.                 Stroke; Risk Factors:Hypertension and Current Smoker.  Sonographer:    Jeryl Columbia Referring Phys: [id number] CAROLE N HALL IMPRESSIONS  1. Left ventricular ejection fraction, by estimation, is 60 to 65%. The left ventricle has normal function. The left ventricle has no regional wall motion abnormalities. Left ventricular diastolic parameters were normal.  2. Right ventricular systolic function is normal. The right ventricular size is normal.  3. The mitral valve is normal in structure. Trivial mitral valve regurgitation. No evidence of mitral stenosis.  4. The aortic valve is normal in structure. Aortic valve regurgitation is not visualized. No aortic stenosis is present.  5. The inferior vena cava is normal in size with greater than 50% respiratory variability, suggesting right atrial pressure of 3 mmHg.  6. Agitated saline contrast bubble study was negative, with no evidence of any interatrial shunt. FINDINGS  Left Ventricle: Left ventricular ejection fraction, by estimation, is 60 to 65%. The left ventricle has normal  function. The left ventricle has no regional wall motion abnormalities. The left ventricular internal cavity size was normal in size. There is  no left ventricular hypertrophy. Left ventricular diastolic parameters were normal. Right Ventricle: The right ventricular size is normal. No increase in right ventricular wall thickness. Right ventricular systolic function is normal. Left Atrium: Left atrial size was normal in size. Right Atrium: Right atrial size was normal in size. Pericardium: There is no evidence of pericardial effusion. Mitral Valve: The mitral valve is normal in structure. There is mild thickening of the mitral valve leaflet(s). Trivial mitral valve regurgitation. No evidence of mitral valve stenosis. Tricuspid Valve: The tricuspid valve is normal in structure. Tricuspid  valve regurgitation is not demonstrated. No evidence of tricuspid stenosis. Aortic Valve: The aortic valve is normal in structure. Aortic valve regurgitation is not visualized. No aortic stenosis is present. Pulmonic Valve: The pulmonic valve was normal in structure. Pulmonic valve regurgitation is not visualized. No evidence of pulmonic stenosis. Aorta: The aortic root is normal in size and structure. Venous: The inferior vena cava is normal in size with greater than 50% respiratory variability, suggesting right atrial pressure of 3 mmHg. IAS/Shunts: No atrial level shunt detected by color flow Doppler. Agitated saline contrast was given intravenously to evaluate for intracardiac shunting. Agitated saline contrast bubble study was negative, with no evidence of any interatrial shunt. Charlton Haws MD Electronically signed by Charlton Haws MD Signature Date/Time: 08/04/2020/3:38:04 PM    Final      Time Spent in minutes > 30     Laverna Peace M.D on 08/07/2020 at 4:29 PM  To page go to [email or web address] - password [facility]

## 2020-08-07 NOTE — Progress Notes (Signed)
   08/07/20 1000  Clinical Encounter Type  Visited With Patient  Visit Type Initial  Referral From Other (Comment) (Rounding unit)  Consult/Referral To Chaplain  Visit and offered prayer to patient.This note was prepared by Deneen Harts, M.Div..  For questions please contact by phone [phone].

## 2020-08-07 NOTE — Progress Notes (Signed)
    [facility] has been requested to perform a transesophageal echocardiogram on [name] for Stroke.  After careful review of history and examination, the risks and benefits of transesophageal echocardiogram have been explained including risks of esophageal damage, perforation (1:10,000 risk), bleeding, pharyngeal hematoma as well as other potential complications associated with conscious sedation including aspiration, arrhythmia, respiratory failure and death. Alternatives to treatment were discussed, questions were answered. Patient is willing to proceed.  TEE - Dr. Tenny Craw 08/09/20 @ 8am . NPO after midnight. Meds with sips.   Manson Passey, PA-C 08/07/2020 1:40 PM

## 2020-08-07 NOTE — Progress Notes (Signed)
Occupational Therapy Treatment Patient Details Name: Adam [name] MRN: [id number] DOB: 16-May-1961 Today's Date: 08/07/2020    History of present illness 59 y.o. male with medical history significant for essential hypertension, prior CVA in 2013, seen in [place], who presented to [facility] ED from home with complaints of left arm numbness and drooling. MRI revealed patchy small volume acute right MCA territory infarcts involving the right frontoparietal region and multiple remote lacunar infarcts about the bilateral basal ganglia and right cerebellum.   OT comments  Pt progressing well toward stated goals. Session focused on cognition (trail making, safety, command following) for increased safety with ADL/IADL. Pt able to complete mobility at ADL at supervision level this date. Pt completed 3 part trail making task in hallway with mod VC's. He continues to require increased time for problem solving, command following, and processing. Updated recs to OP OT. Will continue to follow and progress cognitive goals.   Follow Up Recommendations  Outpatient OT;Supervision/Assistance - 24 hour    Equipment Recommendations  Tub/shower seat    Recommendations for Other Services      Precautions / Restrictions Precautions Precautions: Fall Restrictions Weight Bearing Restrictions: No       Mobility Bed Mobility               General bed mobility comments: sitting EOB on arrival  Transfers Overall transfer level: Needs assistance Equipment used: None Transfers: Sit to/from Stand Sit to Stand: Supervision         General transfer comment: for safety    Balance Overall balance assessment: Needs assistance Sitting-balance support: No upper extremity supported;Feet supported Sitting balance-Leahy Scale: Good     Standing balance support: No upper extremity supported;During functional activity Standing balance-Leahy Scale: Fair Standing balance comment: accepts mod challenge                            ADL either performed or assessed with clinical judgement   ADL Overall ADL's : Needs assistance/impaired                                     Functional mobility during ADLs: Supervision/safety General ADL Comments: Pt continuing to perform ADL at supervision level. Was able to dress self in own clothes and don own shoes this date. Continue to note cognitive deficits (suspect close to baseline)     Vision Baseline Vision/History: No visual deficits Patient Visual Report: No change from baseline     Perception     Praxis      Cognition Arousal/Alertness: Awake/alert Behavior During Therapy: WFL for tasks assessed/performed Overall Cognitive Status: No family/caregiver present to determine baseline cognitive functioning Area of Impairment: Problem solving;Awareness;Following commands                       Following Commands: Follows multi-step commands with increased time   Awareness: Emergent Problem Solving: Requires verbal cues;Slow processing General Comments: difficulty following multi step commands and problem solving higher level tasks        Exercises Other Exercises Other Exercises: Dynamic balance interventions: Backwards walking x10 ft, lateral stepping x10 ft bilaterally, step over obstacles, big stepping, little stepping, SLS x10 seconds bilaterally with UL support   Shoulder Instructions       General Comments      Pertinent Vitals/ Pain       Pain Assessment:  No/denies pain  Home Living                                          Prior Functioning/Environment              Frequency  Min 2X/week        Progress Toward Goals  OT Goals(current goals can now be found in the care plan section)  Progress towards OT goals: Progressing toward goals  Acute Rehab OT Goals Patient Stated Goal: go home OT Goal Formulation: With patient Time For Goal Achievement:  08/20/20 Potential to Achieve Goals: Good  Plan Discharge plan needs to be updated    Co-evaluation                 AM-PAC OT "6 Clicks" Daily Activity     Outcome Measure   Help from another person eating meals?: None Help from another person taking care of personal grooming?: A Little Help from another person toileting, which includes using toliet, bedpan, or urinal?: A Little Help from another person bathing (including washing, rinsing, drying)?: A Little Help from another person to put on and taking off regular upper body clothing?: A Little Help from another person to put on and taking off regular lower body clothing?: A Little 6 Click Score: 19    End of Session Equipment Utilized During Treatment: Gait belt  OT Visit Diagnosis: Unsteadiness on feet (R26.81);Other abnormalities of gait and mobility (R26.89);Muscle weakness (generalized) (M62.81);Other symptoms and signs involving cognitive function   Activity Tolerance Patient tolerated treatment well   Patient Left Other (comment) (in hallway with PT)   Nurse Communication Mobility status        Time: 8101-7510 OT Time Calculation (min): 12 min  Charges: OT General Charges $OT Visit: 1 Visit OT Treatments $Self Care/Home Management : 8-22 mins  Dalphine Handing, MSOT, OTR/L Acute Rehabilitation Services [facility] Office Number: [phone] Pager: [phone]  Dalphine Handing 08/07/2020, 5:52 PM

## 2020-08-07 NOTE — Progress Notes (Addendum)
  Speech Language Pathology Treatment: Dysphagia  Patient Details Name: Adam [name] MRN: [id number] DOB: Nov 01, 1961 Today's Date: 08/07/2020 Time: 4098-1191 SLP Time Calculation (min) (ACUTE ONLY): 26 min  Assessment / Plan / Recommendation Clinical Impression  Pt seen sitting edge of bed, smiling. Pt smiling constantly and when questioned, stated he cannot stop smiling. Pt was able to protrude and lateralize his tongue and lift tongue tip, though in MBS pt had very little lingual tongue tip elevation and propulsion.  When commanded to pucker his lips, this was very hard to achieve and sustain as pt instantly reverted to smiling.  In study, he appeared to trigger his swallow with sensory feedback from spillage of bolus to pharynx. He stated that he could sense taste and temperature of boluses, but named the flavor or a lemon icee as "banana." Pt reported that thickened liquids nauseated him. SLP offered trials with an icee, which did elicit lingual movement and puckering initially and a few spontaneous swallows though there was still a good mount of oral residue. When offered a cup sip of plan Sprite, pt had a good labial seal and some initial lingual manipulation, particularly when verbal cues and verbal model for lingual thrusting and lip licking. A posterior head tilt to allow spillage of liquids to pharynx triggered the swallow.   Though pt is still very impaired and intake will be limited, Hopeful that more natural palatable intake of thin liquids will increase motor automaticity. Strongly suspect that pt is experiencing a sensory feedback impairment that impacts the automatic motor response. Will change diet to full liquids and encourage supplementation with calorie dense drinks. Requested Dietitian consult.    HPI HPI: Patient is a 59 y.o. male with PMH: HTN, prior CVA (residual right sided weakness and dysarthria) who presented to hospital with acute onset drolling with assocated patchy left  arm and torso sensory changes. Per chart review, his sister had reported she noticed bifacial weakness, significant worsened speech and significantly worsened gait. MRI brain revealed patchy small volume acute right MCA territory infarcts involving right frontoparietal region but no associated hemorrhage or mass effect, as well as underlying advanced cerebral atrophy and chronic microvascular ischemic disease, multiple remote lacunar infarcts about the basal ganglia and right cerebellum.      SLP Plan  Continue with current plan of care       Recommendations  Diet recommendations: Thin liquid Liquids provided via: Cup Medication Administration: Other (Comment) (crushed with liquid) Supervision: Patient able to self feed Compensations:  (poterior head tilt) Postural Changes and/or Swallow Maneuvers: Seated upright 90 degrees                Oral Care Recommendations: Oral care BID Follow up Recommendations: Outpatient SLP Plan: Continue with current plan of care       GO               Harlon Ditty, MA CCC-SLP  Acute Rehabilitation Services Pager [phone] Office [phone]  Claudine Mouton 08/07/2020, 12:56 PM

## 2020-08-07 NOTE — TOC CAGE-AID Note (Signed)
Transition of Care [facility]) - CAGE-AID Screening   Patient Details  Name: Adam [name] MRN: [id number] Date of Birth: 1961/11/03  Transition of Care [facility]) CM/SW Contact:    Emeterio Reeve, [place] Phone Number: 08/07/2020, 12:55 PM   Clinical Narrative:  CSW met with pt at bedside. CSW introduced self and explained her role at the hospital.  Pt denies alcohol use. Pt reports marijuana use. Pt declines resources at this time.   CAGE-AID Screening:    Have You Ever Felt You Ought to Cut Down on Your Drinking or Drug Use?: No Have People Annoyed You By Critizing Your Drinking Or Drug Use?: No Have You Felt Bad Or Guilty About Your Drinking Or Drug Use?: No Have You Ever Had a Drink or Used Drugs First Thing In The Morning to Steady Your Nerves or to Get Rid of a Hangover?: No CAGE-AID Score: 0  Substance Abuse Education Offered: Yes  Substance abuse interventions: Patient Counseling   Emeterio Reeve, Latanya Presser, [place] Social Worker [phone]

## 2020-08-08 LAB — BASIC METABOLIC PANEL
Anion gap: 14 (ref 5–15)
BUN: 45 mg/dL — ABNORMAL HIGH (ref 6–20)
CO2: 23 mmol/L (ref 22–32)
Calcium: 9.8 mg/dL (ref 8.9–10.3)
Chloride: 104 mmol/L (ref 98–111)
Creatinine, Ser: 1.27 mg/dL — ABNORMAL HIGH (ref 0.61–1.24)
GFR calc Af Amer: >60 mL/min (ref >60)
GFR calc non Af Amer: >60 mL/min (ref >60)
Glucose, Bld: 93 mg/dL (ref 70–99)
Potassium: 3.3 mmol/L — ABNORMAL LOW (ref 3.5–5.1)
Sodium: 141 mmol/L (ref 135–145)

## 2020-08-08 LAB — MAGNESIUM: Magnesium: 2.3 mg/dL (ref 1.7–2.4)

## 2020-08-08 MED ORDER — ENSURE ENLIVE PO LIQD
237.0000 mL | Freq: Three times a day (TID) | ORAL | Status: DC
  Administered 2020-08-08 (×2): 237 mL via ORAL

## 2020-08-08 MED ORDER — POTASSIUM CHLORIDE 20 MEQ PO PACK
40.0000 meq | PACK | Freq: Once | ORAL | Status: AC
  Administered 2020-08-08: 40 meq via ORAL
  Filled 2020-08-08: qty 2

## 2020-08-08 MED ORDER — SODIUM CHLORIDE 0.9 % IV SOLN: INTRAVENOUS | Status: DC

## 2020-08-08 NOTE — Progress Notes (Signed)
Physical Therapy Treatment Patient Details Name: Adam [name] MRN: [id number] DOB: 09/01/1961 Today's Date: 08/08/2020    History of Present Illness 58 y.o. male with medical history significant for essential hypertension, prior CVA in 2013, seen in [place], who presented to [facility] ED from home with complaints of left arm numbness and drooling. MRI revealed patchy small volume acute right MCA territory infarcts involving the right frontoparietal region and multiple remote lacunar infarcts about the bilateral basal ganglia and right cerebellum.    PT Comments    Pt demonstrating uncontrolled R knee hyperextension today, improved with PT cuing for keeping athletic stance during stance phase but difficult for pt to consistently implement. Pt continues to require mod verbal cuing for increasing bilateral foot clearance during swing phase of gait, and gait tends to improve when pt implements longer step length. Pt required x1 seated rest break during gait training today secondary to tachycardia up to 149 bpm, pt asymptomatic. PT to continue to follow acutely, OPPT remains appropriate PT d/c recommendation.     Follow Up Recommendations  Supervision for mobility/OOB;Outpatient PT     Equipment Recommendations  Other (comment) (TBD)    Recommendations for Other Services       Precautions / Restrictions Precautions Precautions: Fall Restrictions Weight Bearing Restrictions: No    Mobility  Bed Mobility Overal bed mobility: Modified Independent             General bed mobility comments: increased time to perform  Transfers Overall transfer level: Needs assistance Equipment used: None Transfers: Sit to/from Stand Sit to Stand: Supervision         General transfer comment: for safety  Ambulation/Gait Ambulation/Gait assistance: Min guard Gait Distance (Feet): 475 Feet (+75) Assistive device: None Gait Pattern/deviations: Step-through pattern;Decreased stride  length;Ataxic;Shuffle;Narrow base of support;Decreased dorsiflexion - right Gait velocity: decr   General Gait Details: min guard for safety, + R knee recurvatum with uncontrolled hyperextension in early stance phase, improved with keeping slight R knee flexion through stance phase but difficult for pt to implement. Verbal cuing throughout for taking larger steps, heel-toe gait, and increased foot clearance. Seated rest break x1 minute for HR recovery, HRmax 149 bpm during gait.   Stairs             Wheelchair Mobility    Modified Rankin (Stroke Patients Only) Modified Rankin (Stroke Patients Only) Pre-Morbid Rankin Score: Slight disability Modified Rankin: Moderate disability     Balance Overall balance assessment: Needs assistance Sitting-balance support: No upper extremity supported;Feet supported Sitting balance-Leahy Scale: Good     Standing balance support: No upper extremity supported;During functional activity Standing balance-Leahy Scale: Fair Standing balance comment: accepts mod challenge               High Level Balance Comments: stepping over obstacles, large stepping x20 ft            Cognition Arousal/Alertness: Awake/alert Behavior During Therapy: WFL for tasks assessed/performed Overall Cognitive Status: No family/caregiver present to determine baseline cognitive functioning Area of Impairment: Problem solving;Awareness;Following commands                       Following Commands: Follows multi-step commands with increased time   Awareness: Emergent Problem Solving: Requires verbal cues;Slow processing General Comments: Repeated verbal cuing for safe mobility      Exercises General Exercises - Lower Extremity Long Arc Quad: AROM;Right;15 reps;Seated    General Comments        Pertinent Vitals/Pain  Pain Assessment: No/denies pain    Home Living                      Prior Function            PT Goals (current  goals can now be found in the care plan section) Acute Rehab PT Goals Patient Stated Goal: go home PT Goal Formulation: With patient Time For Goal Achievement: 08/18/20 Potential to Achieve Goals: Good Progress towards PT goals: Progressing toward goals    Frequency    Min 4X/week      PT Plan Current plan remains appropriate    Co-evaluation              AM-PAC PT "6 Clicks" Mobility   Outcome Measure  Help needed turning from your back to your side while in a flat bed without using bedrails?: None Help needed moving from lying on your back to sitting on the side of a flat bed without using bedrails?: None Help needed moving to and from a bed to a chair (including a wheelchair)?: None Help needed standing up from a chair using your arms (e.g., wheelchair or bedside chair)?: None Help needed to walk in hospital room?: A Little Help needed climbing 3-5 steps with a railing? : A Little 6 Click Score: 22    End of Session Equipment Utilized During Treatment: Gait belt Activity Tolerance: Patient tolerated treatment well Patient left: in bed;with call bell/phone within reach (sitting EOB in ED ) Nurse Communication: Mobility status PT Visit Diagnosis: Unsteadiness on feet (R26.81);Difficulty in walking, not elsewhere classified (R26.2)     Time: 7858-8502 PT Time Calculation (min) (ACUTE ONLY): 27 min  Charges:  $Gait Training: 23-37 mins                     Adam [name] E, PT Acute Rehabilitation Services Pager [phone]  Office [phone]     Adam [name] D Despina Hidden 08/08/2020, 10:47 AM

## 2020-08-08 NOTE — Progress Notes (Addendum)
[facility]  PROGRESS NOTE  Adam [name] KVQ:[id number] DOB: 1961/04/29 DOA: 08/03/2020 PCP: Patient, [name] Admit date - 08/03/2020   Admitting Physician Darlin Drop, DO  Outpatient Primary MD for the patient is Patient, [name]  LOS - 4 Brief Narrative   Adam [name] is a 59 y.o. year old male with medical history significant for HTN, prior stroke (residual right-sided weakness and dysarthria) who presented on 08/03/2020 with acute onset drooling, left arm/torso sensory changes, headache, bifacial weakness and worsening speech and gait and was found to have acute right MCA territory infarcts.CTA of head showed high-grade focal distal stenosis within the distal A2/proximal A2 left anterior cerebral artery with high-grade focal stenosis in the P2 left PCA high-grade stenosis within the P4 right PCA.  CTA neck showed no significant stenosis.  TTE showed preserved EF.  LDL 80, A1c 5.7  Hospital course complicated by persistent drooling and decreased p.o. intake  Subjective  Today he has been trying to eat vanilla ice cream for meals  A & P  Acute right MCA territory infarcts, embolic event unclear source currently -N.p.o. at midnight for planned TEE -Does have some left-sided weakness but able to mobilize well and does not currently need CIR, was able to discharge home we will set up for home health PT-continue aspirin, Plavix x3 weeks followed by aspirin alone per neurology-continue high intensity level statin   Increased drooling and difficulty swallowing, unclear if this is residual deficit of recent stroke/apraxia or pseudogout bulbar state related to dysphagia.  Currently having difficulty tolerating p.o. due to significant inability to control oral secretions despite diet change to liquid -Continue intensive speech therapy while inpatient -Adding core track tube placement to assist with oral nutrition, suspect patient may warrant  permanent nutrition alternative i.e.  feeding tube if no improvement  B12 deficiency.  The setting of severe calorie protein malnutrition B12 90 -continue B12, thiamine, multivitamin supplementation  Hypertension, not at goal but improving.  SBP's in the 140s but DBP's remain greater than 100.  TSH within normal limits That -Started on lisinopril 10 mg on 9/15, will hold off on increasing given creatinine currently 1.27(prior baseline 0.9-1.1) -Continue HCTZ 25 mg -Started amlodipine 5 mg on 9/16   AKI, mild Baseline creatinine 0.9-1.  Currently 1. 2 7 could be related to the recently started lisinopril the last 24 hours.  Could also related to decreased oral intake related to persistent drooling/inability to tolerate oral secretions -Monitor BMP -Avoid nephrotoxins  Hypokalemia, mild.  Patient is on HCTZ.  Mag within normal limits -Replete orally as allowed -Monitor BMP  Severe malnutrition.  Has fair muscle depletion examination -Given persistent drooling and difficulty swallowing even a liquid diet will add core track for alternative nutrition -Appreciate speech and nutrition consultations     Family Communication  : Updated sister on 9/15  Code Status : Full  Disposition Plan  :  Patient is from home. Anticipated d/c date:  1 to 2 days. Barriers to d/c or necessity for inpatient status:  TEE for evaluation of embolic event leading to CVA, close monitor p.o. intake given inability to maintain oral secretions and may warrant feeding tube Consults  : Neurology  Procedures  : TTE, 9/16  DVT Prophylaxis  :  Lovenox   Lab Results  Component Value Date   PLT 207 08/03/2020    Diet :  Diet Order            Diet NPO time specified Except  for: Sips with Meds  Diet effective midnight           Diet full liquid Room service appropriate? No; Fluid consistency: Thin  Diet effective now                  Inpatient Medications Scheduled Meds: . amLODipine  5 mg Oral Daily  . aspirin EC  81 mg Oral Daily    Or  . aspirin  300 mg Rectal Daily  . atorvastatin  80 mg Oral Daily  . clopidogrel  75 mg Oral Daily  . enoxaparin (LOVENOX) injection  40 mg Subcutaneous Daily  . feeding supplement (ENSURE ENLIVE)  237 mL Oral TID BM  . hydrochlorothiazide  25 mg Oral Daily  . lisinopril  10 mg Oral Daily  . multivitamins with iron  1 tablet Oral Daily  . thiamine  100 mg Oral Daily  . vitamin B-12  1,000 mcg Oral Daily   Continuous Infusions: . sodium chloride     PRN Meds:.hydrALAZINE, Resource ThickenUp Clear  Antibiotics  :   Anti-infectives (From admission, onward)   None       Objective   Vitals:   08/07/20 1550 08/07/20 2136 08/08/20 0746 08/08/20 0900  BP: (!) 148/114 (!) 149/104 (!) 132/111 (!) 130/107  Pulse: 92 90 (!) 104   Resp: 16 18 17    Temp: 97.8 F (36.6 C) 98.5 F (36.9 C) 98.7 F (37.1 C)   TempSrc:  Oral    SpO2: 97% 100% 97%     SpO2: 97 %  Wt Readings from Last 3 Encounters:  No data found for Wt    No intake or output data in the 24 hours ending 08/08/20 1407  Physical Exam:     Awake Alert, Oriented X 3, Very thin, frail gentleman Difficulty speaking given persistent drooling No new F.N deficits,  [place].AT, Normal respiratory effort on room air, CTAB RRR,No Gallops,Rubs or new Murmurs,  +ve B.Sounds, Abd Soft, No tenderness, No rebound, guarding or rigidity. No Cyanosis, No new Rash or bruise   I have personally reviewed the following:   Data Reviewed:  CBC Recent Labs  Lab 08/03/20 1427 08/03/20 1438  WBC 5.2  --   HGB 13.6 13.9  HCT 41.0 41.0  PLT 207  --   MCV 74.5*  --   MCH 24.7*  --   MCHC 33.2  --   RDW 15.5  --   LYMPHSABS 1.7  --   MONOABS 0.4  --   EOSABS 0.0  --   BASOSABS 0.0  --     Chemistries  Recent Labs  Lab 08/03/20 1427 08/03/20 1438 08/07/20 1101 08/08/20 0135 08/08/20 0927  NA 142 145 144 141  --   K 3.6 3.7 3.4* 3.3*  --   CL 107 109 107 104  --   CO2 21*  --  24 23  --   GLUCOSE 87 82 105*  93  --   BUN 29* 30* 39* 45*  --   CREATININE 1.10 0.90 1.30* 1.27*  --   CALCIUM 9.7  --  10.4* 9.8  --   MG  --   --   --   --  2.3  AST 16  --   --   --   --   ALT 11  --   --   --   --   ALKPHOS 76  --   --   --   --  BILITOT 0.8  --   --   --   --    ------------------------------------------------------------------------------------------------------------------ No results for input(s): CHOL, HDL, LDLCALC, TRIG, CHOLHDL, LDLDIRECT in the last 72 hours.  Lab Results  Component Value Date   HGBA1C 5.7 (H) 08/06/2020   ------------------------------------------------------------------------------------------------------------------ Recent Labs    08/07/20 1101  TSH 1.347   ------------------------------------------------------------------------------------------------------------------ No results for input(s): VITAMINB12, FOLATE, FERRITIN, TIBC, IRON, RETICCTPCT in the last 72 hours.  Coagulation profile Recent Labs  Lab 08/03/20 1427  INR 1.1    No results for input(s): DDIMER in the last 72 hours.  Cardiac Enzymes No results for input(s): CKMB, TROPONINI, MYOGLOBIN in the last 168 hours.  Invalid input(s): CK ------------------------------------------------------------------------------------------------------------------ No results found for: BNP  Micro Results Recent Results (from the past 240 hour(s))  SARS Coronavirus 2 by RT PCR (hospital order, performed in [facility] hospital lab) Nasopharyngeal Nasopharyngeal Swab     Status: None   Collection Time: 08/04/20  3:22 AM   Specimen: Nasopharyngeal Swab  Result Value Ref Range Status   SARS Coronavirus 2 NEGATIVE NEGATIVE Final    Comment: (NOTE) SARS-CoV-2 target nucleic acids are NOT DETECTED.  The SARS-CoV-2 RNA is generally detectable in upper and lower respiratory specimens during the acute phase of infection. The lowest concentration of SARS-CoV-2 viral copies this assay can detect is 250 copies  / mL. A negative result does not preclude SARS-CoV-2 infection and should not be used as the sole basis for treatment or other patient management decisions.  A negative result may occur with improper specimen collection / handling, submission of specimen other than nasopharyngeal swab, presence of viral mutation(s) within the areas targeted by this assay, and inadequate number of viral copies (<250 copies / mL). A negative result must be combined with clinical observations, patient history, and epidemiological information.  Fact Sheet for Patients:   BoilerBrush.com.cy  Fact Sheet for Healthcare Providers: https://pope.com/  This test is not yet approved or  cleared by the [place] FDA and has been authorized for detection and/or diagnosis of SARS-CoV-2 by FDA under an Emergency Use Authorization (EUA).  This EUA will remain in effect (meaning this test can be used) for the duration of the COVID-19 declaration under Section 564(b)(1) of the Act, 21 U.S.C. section 360bbb-3(b)(1), unless the authorization is terminated or revoked sooner.  Performed at [facility], [address] [place], [place] [address]     Radiology Reports CT Angio Head W or [place] Contrast  Result Date: 08/03/2020 CLINICAL DATA:  Neuro deficit, acute, stroke suspected. Additional history provided: Numbness yesterday. Drooling. EXAM: CT ANGIOGRAPHY HEAD AND NECK TECHNIQUE: Multidetector CT imaging of the head and neck was performed using the standard protocol during bolus administration of intravenous contrast. Multiplanar CT image reconstructions and MIPs were obtained to evaluate the vascular anatomy. Carotid stenosis measurements (when applicable) are obtained utilizing NASCET criteria, using the distal internal carotid diameter as the denominator. CONTRAST:  75mL OMNIPAQUE IOHEXOL 350 MG/ML SOLN COMPARISON:  No pertinent prior exams are available for  comparison. FINDINGS: CT HEAD FINDINGS Brain: Mild generalized parenchymal atrophy. There are multiple chronic appearing lacunar infarcts within the basal ganglia bilaterally. Background advanced ill-defined hypoattenuation within the cerebral white matter is nonspecific, but consistent with chronic small vessel ischemic disease. Subcentimeter age-indeterminate infarct within right cerebellar hemisphere (series 5, image 13). There is no acute intracranial hemorrhage. No demarcated cortical infarct is identified. No extra-axial fluid collection. No evidence of intracranial mass. No midline shift. Vascular: Reported below.  Skull: Normal. Negative for fracture or focal lesion. Sinuses: No significant paranasal sinus disease or mastoid effusion at the imaged levels. Orbits: No acute finding. Chronic fracture deformity of the right lamina papyracea. Review of the MIP images confirms the above findings CTA NECK FINDINGS Aortic arch: Common origin of the innominate and left common carotid arteries. The visualized aortic arch is unremarkable. No hemodynamically significant innominate or proximal subclavian artery stenosis. Right carotid system: CCA and ICA patent within the neck without stenosis. Left carotid system: CCA and ICA patent within the neck without stenosis. Vertebral arteries: Codominant and patent within the neck without stenosis Skeleton: No acute bony abnormality or aggressive osseous lesion. Cervical spondylosis with multilevel disc space narrowing, disc bulges, uncovertebral and facet hypertrophy. Other neck: No neck mass or cervical lymphadenopathy. Thyroid unremarkable. Upper chest: No consolidation within the imaged lung apices. Review of the MIP images confirms the above findings CTA HEAD FINDINGS Anterior circulation: The intracranial internal carotid arteries are patent. The M1 middle cerebral arteries are patent. There is moderate stenosis within the distal M1 right middle cerebral artery. No  definite right M2 proximal branch occlusion is identified. However, there is a paucity of visualized proximal superior division right M2 MCA branches. The right anterior cerebral artery is patent without significant proximal stenosis. There is a high-grade focal stenosis within the distal A2/proximal A3 left anterior cerebral artery. No intracranial aneurysm is identified. Posterior circulation: The intracranial vertebral arteries are patent. The basilar artery is patent. The posterior cerebral arteries are patent. There is a high-grade focal stenosis within the P2 left PCA. Additionally, there are multifocal high-grade stenoses within the P4 right PCA. Posterior communicating arteries are present bilaterally. Venous sinuses: Within limitations of contrast timing, no convincing thrombus. Anatomic variants: None significant. Review of the MIP images confirms the above findings These results were called by telephone at the time of interpretation on 08/03/2020 at 8:28 pm to provider [facility] , who verbally acknowledged these results. IMPRESSION: CT head: 1. Subcentimeter age-indeterminate infarct within the right cerebellum. 2. Multiple chronic appearing lacunar infarcts within the bilateral basal ganglia. 3. Background mild generalized parenchymal atrophy with advanced chronic small vessel ischemic disease. CTA neck: The common carotid, internal carotid and vertebral arteries are patent within the neck without hemodynamically significant stenosis. CTA head: 1. No definite proximal right M2 branch occlusion is identified. However, there is a paucity of visualized proximal M2 superior division right MCA branch vessels. If there is concern for an acute right MCA territory stroke, consider catheter based angiography for further evaluation. 2. Intracranial atherosclerotic disease with multifocal stenoses, most notably as follows. 3. Moderate stenosis within the distal M1 right middle cerebral artery. 4. High-grade  focal stenosis within the distal A2/proximal A3 left anterior cerebral artery. 5. High-grade focal stenosis within the P2 left PCA. 6. High-grade stenoses within the P4 right PCA. Electronically Signed   By: Jackey Loge DO   On: 08/03/2020 20:29   CT ANGIO NECK W OR WO CONTRAST  Result Date: 08/03/2020 CLINICAL DATA:  Neuro deficit, acute, stroke suspected. Additional history provided: Numbness yesterday. Drooling. EXAM: CT ANGIOGRAPHY HEAD AND NECK TECHNIQUE: Multidetector CT imaging of the head and neck was performed using the standard protocol during bolus administration of intravenous contrast. Multiplanar CT image reconstructions and MIPs were obtained to evaluate the vascular anatomy. Carotid stenosis measurements (when applicable) are obtained utilizing NASCET criteria, using the distal internal carotid diameter as the denominator. CONTRAST:  75mL OMNIPAQUE IOHEXOL 350 MG/ML SOLN COMPARISON:  No pertinent prior exams are available for comparison. FINDINGS: CT HEAD FINDINGS Brain: Mild generalized parenchymal atrophy. There are multiple chronic appearing lacunar infarcts within the basal ganglia bilaterally. Background advanced ill-defined hypoattenuation within the cerebral white matter is nonspecific, but consistent with chronic small vessel ischemic disease. Subcentimeter age-indeterminate infarct within right cerebellar hemisphere (series 5, image 13). There is no acute intracranial hemorrhage. No demarcated cortical infarct is identified. No extra-axial fluid collection. No evidence of intracranial mass. No midline shift. Vascular: Reported below. Skull: Normal. Negative for fracture or focal lesion. Sinuses: No significant paranasal sinus disease or mastoid effusion at the imaged levels. Orbits: No acute finding. Chronic fracture deformity of the right lamina papyracea. Review of the MIP images confirms the above findings CTA NECK FINDINGS Aortic arch: Common origin of the innominate and left common  carotid arteries. The visualized aortic arch is unremarkable. No hemodynamically significant innominate or proximal subclavian artery stenosis. Right carotid system: CCA and ICA patent within the neck without stenosis. Left carotid system: CCA and ICA patent within the neck without stenosis. Vertebral arteries: Codominant and patent within the neck without stenosis Skeleton: No acute bony abnormality or aggressive osseous lesion. Cervical spondylosis with multilevel disc space narrowing, disc bulges, uncovertebral and facet hypertrophy. Other neck: No neck mass or cervical lymphadenopathy. Thyroid unremarkable. Upper chest: No consolidation within the imaged lung apices. Review of the MIP images confirms the above findings CTA HEAD FINDINGS Anterior circulation: The intracranial internal carotid arteries are patent. The M1 middle cerebral arteries are patent. There is moderate stenosis within the distal M1 right middle cerebral artery. No definite right M2 proximal branch occlusion is identified. However, there is a paucity of visualized proximal superior division right M2 MCA branches. The right anterior cerebral artery is patent without significant proximal stenosis. There is a high-grade focal stenosis within the distal A2/proximal A3 left anterior cerebral artery. No intracranial aneurysm is identified. Posterior circulation: The intracranial vertebral arteries are patent. The basilar artery is patent. The posterior cerebral arteries are patent. There is a high-grade focal stenosis within the P2 left PCA. Additionally, there are multifocal high-grade stenoses within the P4 right PCA. Posterior communicating arteries are present bilaterally. Venous sinuses: Within limitations of contrast timing, no convincing thrombus. Anatomic variants: None significant. Review of the MIP images confirms the above findings These results were called by telephone at the time of interpretation on 08/03/2020 at 8:28 pm to provider  [facility] , who verbally acknowledged these results. IMPRESSION: CT head: 1. Subcentimeter age-indeterminate infarct within the right cerebellum. 2. Multiple chronic appearing lacunar infarcts within the bilateral basal ganglia. 3. Background mild generalized parenchymal atrophy with advanced chronic small vessel ischemic disease. CTA neck: The common carotid, internal carotid and vertebral arteries are patent within the neck without hemodynamically significant stenosis. CTA head: 1. No definite proximal right M2 branch occlusion is identified. However, there is a paucity of visualized proximal M2 superior division right MCA branch vessels. If there is concern for an acute right MCA territory stroke, consider catheter based angiography for further evaluation. 2. Intracranial atherosclerotic disease with multifocal stenoses, most notably as follows. 3. Moderate stenosis within the distal M1 right middle cerebral artery. 4. High-grade focal stenosis within the distal A2/proximal A3 left anterior cerebral artery. 5. High-grade focal stenosis within the P2 left PCA. 6. High-grade stenoses within the P4 right PCA. Electronically Signed   By: Jackey Loge DO   On: 08/03/2020 20:29   MR Brain W and [place] Contrast  Result  Date: 08/04/2020 CLINICAL DATA:  Initial evaluation for neuro deficit, stroke suspected. EXAM: MRI HEAD WITHOUT AND WITH CONTRAST TECHNIQUE: Multiplanar, multiecho pulse sequences of the brain and surrounding structures were obtained without and with intravenous contrast. CONTRAST:  6mL GADAVIST GADOBUTROL 1 MMOL/ML IV SOLN COMPARISON:  Comparison made with prior CTA from 08/03/2020. FINDINGS: Brain: Advanced cerebral atrophy for age. Patchy and confluent T2/FLAIR hyperintensity within the periventricular deep white matter both cerebral hemispheres, most consistent with chronic microvascular ischemic disease, advanced for age. Multiple scattered remote lacunar infarcts present about the bilateral  basal ganglia. Associated chronic hemosiderin staining noted about several of these infarcts. Additionally, there are multiple small remote right cerebellar infarcts. Patchy small volume restricted diffusion seen involving the cortical and subcortical right frontal parietal region, consistent with acute right MCA territory infarcts. Involvement of the pre and postcentral gyri noted. Largest area of infarction seen at the postcentral gyrus and measures up to 1.6 cm in diameter. No associated hemorrhage or mass effect. Gray-white matter differentiation otherwise maintained. No acute intracranial hemorrhage elsewhere within the brain. No mass lesion, midline shift or mass effect. Ventricles normal size without hydrocephalus. No extra-axial fluid collection. Pituitary gland and suprasellar region within normal limits. No visible parenchymal signal abnormality seen about the medulla or brainstem with thin section imaging. No abnormal enhancement. Vascular: Major intracranial vascular flow voids are maintained. Skull and upper cervical spine: Craniocervical junction within normal limits. Bone marrow signal intensity within normal limits. No focal marrow replacing lesion. No scalp soft tissue abnormality. Sinuses/Orbits: Globes and orbital soft tissues demonstrate no acute finding. Mild scattered mucosal thickening noted within the sphenoid ethmoidal sinuses. No mastoid effusion. Inner ear structures grossly normal. Other: None. IMPRESSION: 1. Patchy small volume acute right MCA territory infarcts involving the right frontoparietal region as above. No associated hemorrhage or mass effect. 2. Underlying advanced cerebral atrophy and chronic microvascular ischemic disease for age, with multiple remote lacunar infarcts about the bilateral basal ganglia and right cerebellum. Electronically Signed   By: Rise Mu M.D.   On: 08/04/2020 02:18   DG Swallowing Func-Speech Pathology  Result Date: 08/05/2020 Objective  Swallowing Evaluation: Type of Study: MBS-Modified Barium Swallow Study  Patient Details Name: Adam [name] MRN: [id number] Date of Birth: 1961/11/11 Today's Date: 08/05/2020 Time: SLP Start Time (ACUTE ONLY): 5284 -SLP Stop Time (ACUTE ONLY): 0902 SLP Time Calculation (min) (ACUTE ONLY): 23 min Past Medical History: No past medical history on file. Past Surgical History: No past surgical history on file. HPI: Patient is a 59 y.o. 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 Per chart review, his sister had reported she noticed bifacial weakness, significant worsened speech and significantly worsened gait. MRI brain revealed patchy small volume acute right MCA territory infarcts involving right frontoparietal region but no associated hemorrhage or mass effect, as well as underlying advanced cerebral atrophy and chronic microvascular ischemic disease, multiple remote lacunar infarcts about the basal ganglia and right cerebellum.  Assessment / Plan / Recommendation CHL IP CLINICAL IMPRESSIONS 08/05/2020 Clinical Impression Pt presents with oropharyngeal dysphagia characterized by reduced labial seal, impaired bolus propulsion, impaired bolus cohesion, reduced lingual retraction, reduced anterior laryngeal movement, and a pharyngeal delay. He demonstrated anterior spillage, oral holding, lingual pumping, piecemeal deglutition, premature spillage, vallecular residue, pyriform sinus residue, penetration (PAS 3) of thin liquids and silent aspiration (PAS 8) of nectar thick liquids secondary to  the pharyngeal delay. Pt's most significant impairment is oral holding with it sometimes being over a minute in length. Pt required verbal and tactile cues to initiate a swallow and anterior spillage was still noted intermittently when pt attempted to swallow.  Pt's swallow was most timely with  with puree solids and honey thick liquids via cup. Amount of pharyngeal residue increased with bolus sizes. 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 Pt is safest with his current diet of dysphagia 1 (puree) solids and honey thick liquids. However, considering the level of support required for swallowing of limited boluses during the study, it is anticipated that may not be able to maintain adequate nutrition or hydration p.o. and that supplemental nonoral nutrition may be needed in the future. SLP will follow for dysphagia treatment.  SLP Visit Diagnosis Dysphagia, oropharyngeal phase (R13.12) Attention and concentration deficit following -- Frontal lobe and executive function deficit following -- Impact on safety and function Mild aspiration risk;Moderate aspiration risk   [facility] IP TREATMENT RECOMMENDATION 08/05/2020 Treatment Recommendations Therapy as outlined in treatment plan below   Prognosis 08/05/2020 Prognosis for Safe Diet Advancement Good Barriers to Reach Goals Cognitive deficits;Severity of deficits Barriers/Prognosis Comment -- [facility] IP DIET RECOMMENDATION 08/05/2020 SLP Diet Recommendations Honey thick liquids;Dysphagia 1 (Puree) solids Liquid Administration via Cup;Straw Medication Administration Crushed with puree Compensations Slow rate;Small sips/bites;Minimize environmental distractions;Follow solids with liquid;Other (Comment) Postural Changes Remain semi-upright after after feeds/meals (Comment)   [facility] IP OTHER RECOMMENDATIONS 08/05/2020 Recommended Consults -- Oral Care Recommendations Oral care BID Other Recommendations --   [facility] IP FOLLOW UP RECOMMENDATIONS 08/05/2020 Follow up Recommendations Inpatient Rehab   [facility] IP FREQUENCY AND DURATION 08/05/2020 Speech Therapy Frequency (ACUTE ONLY) min 2x/week Treatment Duration 2 weeks      [facility] IP ORAL PHASE 08/05/2020 Oral Phase Impaired Oral - Pudding Teaspoon -- Oral - Pudding Cup -- Oral - Honey Teaspoon --  Oral - Honey Cup Reduced posterior propulsion;Holding of bolus;Piecemeal swallowing;Right anterior bolus loss;Left anterior bolus loss;Lingual pumping;Delayed oral transit;Decreased bolus cohesion;Premature spillage Oral - Nectar Teaspoon -- Oral - Nectar Cup Reduced posterior propulsion;Holding of bolus;Piecemeal swallowing;Right anterior bolus loss;Left anterior bolus loss;Lingual pumping;Delayed oral transit;Decreased bolus cohesion;Premature spillage Oral - Nectar Straw Reduced posterior propulsion;Holding of bolus;Piecemeal swallowing;Right anterior bolus loss;Left anterior bolus loss;Lingual pumping;Delayed oral transit;Decreased bolus cohesion;Premature spillage Oral - Thin Teaspoon -- Oral - Thin Cup Reduced posterior propulsion;Holding of bolus;Piecemeal swallowing;Right anterior bolus loss;Left anterior bolus loss;Lingual pumping;Delayed oral transit;Decreased bolus cohesion;Premature spillage Oral - Thin Straw Reduced posterior propulsion;Holding of bolus;Piecemeal swallowing;Right anterior bolus loss;Left anterior bolus loss;Lingual pumping;Delayed oral transit;Decreased bolus cohesion;Premature spillage Oral - Puree Reduced posterior propulsion;Holding of bolus;Piecemeal swallowing;Right anterior bolus loss;Left anterior bolus loss;Lingual pumping;Delayed oral transit;Decreased bolus cohesion;Premature spillage Oral - Mech Soft -- Oral - Regular -- Oral - Multi-Consistency -- Oral - Pill -- Oral Phase - Comment --  [facility] IP PHARYNGEAL PHASE 08/05/2020 Pharyngeal Phase Impaired Pharyngeal- Pudding Teaspoon -- Pharyngeal -- Pharyngeal- Pudding Cup -- Pharyngeal -- Pharyngeal- Honey Teaspoon Reduced anterior laryngeal mobility;Delayed swallow initiation-vallecula;Delayed swallow initiation-pyriform sinuses;Pharyngeal residue - valleculae;Pharyngeal residue - pyriform;Reduced tongue base retraction Pharyngeal -- Pharyngeal- Honey Cup -- Pharyngeal -- Pharyngeal- Nectar Teaspoon -- Pharyngeal -- Pharyngeal-  Nectar Cup Reduced anterior laryngeal mobility;Delayed swallow initiation-vallecula;Delayed swallow initiation-pyriform sinuses;Pharyngeal residue - valleculae;Pharyngeal residue - pyriform;Reduced tongue base retraction;Penetration/Aspiration before swallow Pharyngeal Material enters airway, passes BELOW cords without attempt by patient to eject out (silent aspiration) Pharyngeal- Nectar Straw Reduced anterior laryngeal mobility;Delayed swallow initiation-vallecula;Delayed swallow initiation-pyriform  sinuses;Pharyngeal residue - valleculae;Pharyngeal residue - pyriform;Reduced tongue base retraction Pharyngeal -- Pharyngeal- Thin Teaspoon -- Pharyngeal -- Pharyngeal- Thin Cup Reduced anterior laryngeal mobility;Delayed swallow initiation-vallecula;Delayed swallow initiation-pyriform sinuses;Pharyngeal residue - valleculae;Pharyngeal residue - pyriform;Reduced tongue base retraction;Penetration/Aspiration during swallow Pharyngeal Material enters airway, remains ABOVE vocal cords and not ejected out Pharyngeal- Thin Straw Reduced anterior laryngeal mobility;Delayed swallow initiation-vallecula;Delayed swallow initiation-pyriform sinuses;Pharyngeal residue - valleculae;Pharyngeal residue - pyriform;Reduced tongue base retraction Pharyngeal -- Pharyngeal- Puree Reduced anterior laryngeal mobility;Delayed swallow initiation-vallecula;Delayed swallow initiation-pyriform sinuses;Pharyngeal residue - valleculae;Pharyngeal residue - pyriform;Reduced tongue base retraction Pharyngeal -- Pharyngeal- Mechanical Soft -- Pharyngeal -- Pharyngeal- Regular -- Pharyngeal -- Pharyngeal- Multi-consistency -- Pharyngeal -- Pharyngeal- Pill -- Pharyngeal -- Pharyngeal Comment --  No flowsheet data found. Shanika I. Vear Clock, MS, CCC-SLP Acute Rehabilitation Services Office number [phone] Pager [phone] Scheryl Marten 08/05/2020, 10:11 AM              VAS [place] TRANSCRANIAL DOPPLER W BUBBLES  Result Date: 08/06/2020   Transcranial Doppler with Bubble Indications: Stroke. Comparison Study: No prior study Performing Technologist: Gertie Fey MHA, RDMS, RVT, RDCS  Examination Guidelines: A complete evaluation includes B-mode imaging, spectral Doppler, color Doppler, and power Doppler as needed of all accessible portions of each vessel. Bilateral testing is considered an integral part of a complete examination. Limited examinations for reoccurring indications may be performed as noted.  Summary: No HITS at rest or during Valsalva. Negative transcranial Doppler Bubble study with no evidence of right to left intracardiac communication.  A vascular evaluation was performed. The right middle cerebral artery was studied. An IV was inserted into the patient's right forearm. Verbal informed consent was obtained.  Negative TCD Bubble study *See table(s) above for TCD measurements and observations.  Diagnosing physician: Delia Heady MD Electronically signed by Delia Heady MD on 08/06/2020 at 12:32:56 PM.    Final    ECHOCARDIOGRAM COMPLETE BUBBLE STUDY  Result Date: 08/04/2020    ECHOCARDIOGRAM REPORT   Patient Name:   Adam [name] Date of Exam: 08/04/2020 Medical Rec #:  [id number]      Height: Accession #:    [id number]     Weight: Date of Birth:  01/12/1961      BSA: Patient Age:    58 years       BP:           171/70 mmHg Patient Gender: M              HR:           55 bpm. Exam Location:  Inpatient Procedure: 2D Echo Indications:    Stroke 434.91 / I63.9  History:        Patient has no prior history of Echocardiogram examinations.                 Stroke; Risk Factors:Hypertension and Current Smoker.  Sonographer:    Jeryl Columbia Referring Phys: [id number] CAROLE N HALL IMPRESSIONS  1. Left ventricular ejection fraction, by estimation, is 60 to 65%. The left ventricle has normal function. The left ventricle has no regional wall motion abnormalities. Left ventricular diastolic parameters were normal.  2. Right ventricular  systolic function is normal. The right ventricular size is normal.  3. The mitral valve is normal in structure. Trivial mitral valve regurgitation. No evidence of mitral stenosis.  4. The aortic valve is normal in structure. Aortic valve regurgitation is not visualized. No aortic stenosis is present.  5. The inferior vena cava is normal in size  with greater than 50% respiratory variability, suggesting right atrial pressure of 3 mmHg.  6. Agitated saline contrast bubble study was negative, with no evidence of any interatrial shunt. FINDINGS  Left Ventricle: Left ventricular ejection fraction, by estimation, is 60 to 65%. The left ventricle has normal function. The left ventricle has no regional wall motion abnormalities. The left ventricular internal cavity size was normal in size. There is  no left ventricular hypertrophy. Left ventricular diastolic parameters were normal. Right Ventricle: The right ventricular size is normal. No increase in right ventricular wall thickness. Right ventricular systolic function is normal. Left Atrium: Left atrial size was normal in size. Right Atrium: Right atrial size was normal in size. Pericardium: There is no evidence of pericardial effusion. Mitral Valve: The mitral valve is normal in structure. There is mild thickening of the mitral valve leaflet(s). Trivial mitral valve regurgitation. No evidence of mitral valve stenosis. Tricuspid Valve: The tricuspid valve is normal in structure. Tricuspid valve regurgitation is not demonstrated. No evidence of tricuspid stenosis. Aortic Valve: The aortic valve is normal in structure. Aortic valve regurgitation is not visualized. No aortic stenosis is present. Pulmonic Valve: The pulmonic valve was normal in structure. Pulmonic valve regurgitation is not visualized. No evidence of pulmonic stenosis. Aorta: The aortic root is normal in size and structure. Venous: The inferior vena cava is normal in size with greater than 50% respiratory  variability, suggesting right atrial pressure of 3 mmHg. IAS/Shunts: No atrial level shunt detected by color flow Doppler. Agitated saline contrast was given intravenously to evaluate for intracardiac shunting. Agitated saline contrast bubble study was negative, with no evidence of any interatrial shunt. Charlton HawsPeter Nishan MD Electronically signed by Charlton HawsPeter Nishan MD Signature Date/Time: 08/04/2020/3:38:04 PM    Final      Time Spent in minutes > 30     Laverna PeaceShayla D Jamya Starry M.D on 08/08/2020 at 2:07 PM  To page go to [email or web address] - password [email or web address]

## 2020-08-08 NOTE — Progress Notes (Signed)
STROKE TEAM PROGRESS NOTE  ° ° INTERVAL HISTORY °His speech therapist  is  at the bedside.  Patient continues to have  drooling and difficulty swallowing.   TEE scheduled for tomorrow.  Urine drug screen is pending.  Continues to have dysphagia and is n.p.o. as per speech therapy ° OBJECTIVE °Vitals:  ° 08/07/20 1550 08/07/20 2136 08/08/20 0746 08/08/20 0900  °BP: (!) 148/114 (!) 149/104 (!) 132/111 (!) 130/107  °Pulse: 92 90 (!) 104   °Resp: 16 18 17   °Temp: 97.8 °F (36.6 °C) 98.5 °F (36.9 °C) 98.7 °F (37.1 °C)   °TempSrc:  Oral    °SpO2: 97% 100% 97%   ° ° °CBC:  °Recent Labs  °Lab 08/03/20 °1427 08/03/20 °1438  °WBC 5.2  --   °NEUTROABS 3.1  --   °HGB 13.6 13.9  °HCT 41.0 41.0  °MCV 74.5*  --   °PLT 207  --   ° ° °Basic Metabolic Panel:  °Recent Labs  °Lab 08/07/20 °1101 08/08/20 °0135 08/08/20 °0927  °NA 144 141  --   °K 3.4* 3.3*  --   °CL 107 104  --   °CO2 24 23  --   °GLUCOSE 105* 93  --   °BUN 39* 45*  --   °CREATININE 1.30* 1.27*  --   °CALCIUM 10.4* 9.8  --   °MG  --   --  2.3  ° ° °Lipid Panel:  °   °Component Value Date/Time  ° CHOL 123 08/04/2020 1142  ° TRIG 49 08/04/2020 1142  ° HDL 33 (L) 08/04/2020 1142  ° CHOLHDL 3.7 08/04/2020 1142  ° VLDL 10 08/04/2020 1142  ° LDLCALC 80 08/04/2020 1142  ° °HgbA1c:  °Lab Results  °Component Value Date  ° HGBA1C 5.7 (H) 08/06/2020  ° °Urine Drug Screen: No results found for: LABOPIA, COCAINSCRNUR, LABBENZ, AMPHETMU, THCU, LABBARB  °Alcohol Level  °   °Component Value Date/Time  ° ETH <10 08/03/2020 2354  ° ° °IMAGING ° °CT HEAD °CT Angio Head W or Wo Contrast °CT ANGIO NECK W OR WO CONTRAST °08/03/2020 °IMPRESSION:  ° °CT head:  °1. Subcentimeter age-indeterminate infarct within the right cerebellum.  °2. Multiple chronic appearing lacunar infarcts within the bilateral basal ganglia.  °3. Background mild generalized parenchymal atrophy with advanced chronic small vessel ischemic disease.  ° °CTA neck:  °The common carotid, internal carotid and vertebral arteries  are patent within the neck without hemodynamically significant stenosis.  ° °CTA head:  °1. No definite proximal right M2 branch occlusion is identified. However, there is a paucity of visualized proximal M2 superior division right MCA branch vessels. If there is concern for an acute right MCA territory stroke, consider catheter based angiography for further evaluation.  °2. Intracranial atherosclerotic disease with multifocal stenoses, most notably as follows.  °3. Moderate stenosis within the distal M1 right middle cerebral artery.  °4. High-grade focal stenosis within the distal A2/proximal A3 left anterior cerebral artery.  °5. High-grade focal stenosis within the P2 left PCA.  °6. High-grade stenoses within the P4 right PCA.  ° °MR Brain W and Wo Contrast °08/04/2020 °IMPRESSION:  °1. Patchy small volume acute right MCA territory infarcts involving the right frontoparietal region as above. No associated hemorrhage or mass effect.  °2. Underlying advanced cerebral atrophy and chronic microvascular ischemic disease for age, with multiple remote lacunar infarcts about the bilateral basal ganglia and right cerebellum.  ° °Transthoracic Echocardiogram  °Normal ejection fraction 60 to 65%.  No   cardiac source of embolism ° °ECG - SR rate 82 BPM. (See cardiology reading for complete details) ° °PHYSICAL EXAM °Blood pressure (!) 130/107, pulse (!) 104, temperature 98.7 °F (37.1 °C), resp. rate 17, SpO2 97 %. °Frail unkempt malnourished looking African-American male not in distress. . Afebrile. Head is nontraumatic. Neck is supple without bruit.    Cardiac exam no murmur or gallop. Lungs are clear to auscultation. Distal pulses are well felt. °Neurological Exam  °Awake alert oriented to place and person    Diminished attention, registration and recall.  Mildly dysarthric speech.  No aphasia.  Jaw jerk is brisk.  Weak cough and gag.  Excessive drooling of saliva.  Able to move tongue well in all directions.  Follows  commands well.  Extraocular movements are full range without nystagmus.  Blinks to threat bilaterally.  Bifacial weakness right greater than left lower half.  Tongue deviates to the right on protrusion.  Motor system exam shows no upper or lower extremity drift but mild proximal weakness bilaterally right greater than left.  Mild ankle dorsiflexor weakness in the right.  Reflexes are brisk bilaterally.  Plantars were both equivocal.  Patchy sensory loss in the left chest and medial forearm but not in a stroke distribution. ° °ASSESSMENT/PLAN °[name] is a 58 y.o. male with history of hypertension, prior stroke (residual right-sided weakness and dysarthria), active smoking (reports 1 cigarette/day) presenting with acute onset drooling, patchy left arm and torso sensory changes, headache, cough, bifacial weakness, significantly worsened speech and gait with recent weight loss. He did not receive IV t-PA due to late presentation (>4.5 hours from time of onset). °  °· Stroke: acute right MCA territory infarcts involving the right frontoparietal region with subcentimeter age-indeterminate infarct within the right cerebellum - embolic - source unknown.  Versus intracranial atherosclerosis  °· resultant increased drooling and mild left-sided weakness, significant drooling and swallowing difficulties unclear whether this represents swallowing apraxia or pseudobulbar state related dysphagia °· code Stroke CT Head - not ordered °· CT head - Subcentimeter age-indeterminate infarct within the right cerebellum. Multiple chronic appearing lacunar infarcts within the bilateral basal ganglia.  °· MRI head - Patchy small volume acute right MCA territory infarcts involving the right frontoparietal region as above. Underlying advanced cerebral atrophy and chronic microvascular ischemic disease for age, with multiple remote lacunar infarcts about the bilateral basal ganglia and right cerebellum.  °· MRA head - not  ordered °· CTA Head - High-grade focal stenosis within the distal A2/proximal A3 left anterior cerebral artery. High-grade focal stenosis within the P2 left PCA. High-grade stenoses within the P4 right PCA.  °· CTA Neck - No hemodynamically significant stenosis.  °· CT Perfusion - not ordered °· Carotid Doppler - CTA neck ordered - carotid dopplers not indicated. °· 2D Echo -normal ejection fraction 60 to 65%.  Sars Corona Virus 2 - negative °· LDL -80 mg percent °· HgbA1c -5.7  °· UDS - pending °· VTE prophylaxis - Lovenox °Diet  °Diet Order   °       °  Diet NPO time specified Except for: Sips with Meds  Diet effective midnight       °  °  Diet NPO time specified  Diet effective ____       °  °  Diet full liquid Room service appropriate? No; Fluid consistency: Thin  Diet effective now       °  °  °  °  ° °· No antithrombotic   prior to admission, now on aspirin 81 mg daily °· Patient counseled to be compliant with his antithrombotic medications °· Ongoing aggressive stroke risk factor management °· Therapy recommendations:  pending °· Disposition:  Pending ° °Hypertension °· Home BP meds: none °· Current BP meds: apresoline prn °· Stable °• Permissive hypertension (OK if < 220/120) but gradually normalize in 5-7 days  °• Long-term BP goal normotensive ° °Hyperlipidemia °· Home Lipid lowering medication: none  °· LDL 80 mg percent, goal < 70 °· Current lipid lowering medication: Lipitor 80 mg daily °· Continue statin at discharge ° °Other Stroke Risk Factors °· Cigarette smoker - advised to stop smoking °· Previous ETOH use °· Obesity, There is no height or weight on file to calculate BMI., recommend weight loss, diet and exercise as appropriate  °· Family hx stroke - not on file °· Hx stroke/TIA °· Previous substance abuse ° °Other Active Problems °· Code status - Full code °· NPO °· Mild bradycardia - 50's ° °Hospital day # 4 °Patient has presented with embolic right MCA branch infarct etiology unclear as to  intracranial sclerosis or central cardiac or aortic arch source.  Recommend continue ongoing work-up check urine drug screen and hemoglobin A1c.  Continue aspirin Plavix for 3 weeks followed by aspirin alone.    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  Hopefully this will improve in the next few days otherwise will need a feeding tube for medicines and food.  Discussed with speech therapist and answered questions.. Await TEE tomorrow. °Adam Villanueva, MD °To contact Stroke Continuity provider, please refer to [email or web address]. °After hours, contact General Neurology °

## 2020-08-08 NOTE — Progress Notes (Addendum)
  Speech Language Pathology Treatment: Dysphagia  Patient Details Name: Adam [name] MRN: [id number] DOB: 1961-07-07 Today's Date: 08/08/2020 Time: 9563-8756 SLP Time Calculation (min) (ACUTE ONLY): 25 min  Assessment / Plan / Recommendation Clinical Impression  Pt's oral dysphagia has progressively worsened since previous session with this therapist. He was able to transit cup sips thin water x 2 with head in extension and gravity assisting to to initiate swallow. Straw attempted but he was unable to form an adequate seal. There was no lingual or labial movement with pudding and lemon icy given verbal cues, head positions. Bolus remained in anterior oral sulci and fell from mouth requiring suction with multiple attempts. He was able to close lips with visual feedback with phone/camera. Facial muscles appear to be in constant contraction. Dr. Pearlean Brownie arrived and discussed apraxia or pseudobulbar palsy dysphagia. He is unable to nutritionally support himself with po's and will benefit from longer term alternative nutrition and continue with ST in hospital and at discharge - recommend outpatient. Pt also has goals for speech intelligibility.   HPI HPI: Patient is a 59 y.o. 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 Per chart review, his sister had reported she noticed bifacial weakness, significant worsened speech and significantly worsened gait. MRI brain revealed patchy small volume acute right MCA territory infarcts involving right frontoparietal region but no associated hemorrhage or mass effect, as well as underlying advanced cerebral atrophy and chronic microvascular ischemic disease, multiple remote lacunar infarcts about the basal ganglia and right cerebellum.      SLP Plan  Continue with current plan of care       Recommendations  Diet  recommendations: Other(comment) (full liquids) Liquids provided via: Cup Medication Administration: Via alternative means Supervision: Patient able to self feed Compensations: Minimize environmental distractions;Slow rate;Small sips/bites;Lingual sweep for clearance of pocketing;Monitor for anterior loss Postural Changes and/or Swallow Maneuvers: Seated upright 90 degrees                Oral Care Recommendations: Oral care BID Follow up Recommendations: Outpatient SLP Plan: Continue with current plan of care                       [name] 08/08/2020, 2:35 PM  [name].Ed Nurse, children's [phone] Office [phone]

## 2020-08-08 NOTE — H&P (View-Only) (Signed)
STROKE TEAM PROGRESS NOTE    INTERVAL HISTORY His speech therapist  is  at the bedside.  Patient continues to have  drooling and difficulty swallowing.   TEE scheduled for tomorrow.  Urine drug screen is pending.  Continues to have dysphagia and is n.p.o. as per speech therapy  OBJECTIVE Vitals:   08/07/20 1550 08/07/20 2136 08/08/20 0746 08/08/20 0900  BP: (!) 148/114 (!) 149/104 (!) 132/111 (!) 130/107  Pulse: 92 90 (!) 104   Resp: 16 18 17    Temp: 97.8 F (36.6 C) 98.5 F (36.9 C) 98.7 F (37.1 C)   TempSrc:  Oral    SpO2: 97% 100% 97%     CBC:  Recent Labs  Lab 08/03/20 1427 08/03/20 1438  WBC 5.2  --   NEUTROABS 3.1  --   HGB 13.6 13.9  HCT 41.0 41.0  MCV 74.5*  --   PLT 207  --     Basic Metabolic Panel:  Recent Labs  Lab 08/07/20 1101 08/08/20 0135 08/08/20 0927  NA 144 141  --   K 3.4* 3.3*  --   CL 107 104  --   CO2 24 23  --   GLUCOSE 105* 93  --   BUN 39* 45*  --   CREATININE 1.30* 1.27*  --   CALCIUM 10.4* 9.8  --   MG  --   --  2.3    Lipid Panel:     Component Value Date/Time   CHOL 123 08/04/2020 1142   TRIG 49 08/04/2020 1142   HDL 33 (L) 08/04/2020 1142   CHOLHDL 3.7 08/04/2020 1142   VLDL 10 08/04/2020 1142   LDLCALC 80 08/04/2020 1142   HgbA1c:  Lab Results  Component Value Date   HGBA1C 5.7 (H) 08/06/2020   Urine Drug Screen: No results found for: LABOPIA, COCAINSCRNUR, LABBENZ, AMPHETMU, THCU, LABBARB  Alcohol Level     Component Value Date/Time   ETH <10 08/03/2020 2354    IMAGING  CT HEAD CT Angio Head W or Wo Contrast CT ANGIO NECK W OR WO CONTRAST 08/03/2020 IMPRESSION:   CT head:  1. Subcentimeter age-indeterminate infarct within the right cerebellum.  2. Multiple chronic appearing lacunar infarcts within the bilateral basal ganglia.  3. Background mild generalized parenchymal atrophy with advanced chronic small vessel ischemic disease.   CTA neck:  The common carotid, internal carotid and vertebral arteries  are patent within the neck without hemodynamically significant stenosis.   CTA head:  1. No definite proximal right M2 branch occlusion is identified. However, there is a paucity of visualized proximal M2 superior division right MCA branch vessels. If there is concern for an acute right MCA territory stroke, consider catheter based angiography for further evaluation.  2. Intracranial atherosclerotic disease with multifocal stenoses, most notably as follows.  3. Moderate stenosis within the distal M1 right middle cerebral artery.  4. High-grade focal stenosis within the distal A2/proximal A3 left anterior cerebral artery.  5. High-grade focal stenosis within the P2 left PCA.  6. High-grade stenoses within the P4 right PCA.   MR Brain W and Wo Contrast 08/04/2020 IMPRESSION:  1. Patchy small volume acute right MCA territory infarcts involving the right frontoparietal region as above. No associated hemorrhage or mass effect.  2. Underlying advanced cerebral atrophy and chronic microvascular ischemic disease for age, with multiple remote lacunar infarcts about the bilateral basal ganglia and right cerebellum.   Transthoracic Echocardiogram  Normal ejection fraction 60 to 65%.  No  cardiac source of embolism  ECG - SR rate 82 BPM. (See cardiology reading for complete details)  PHYSICAL EXAM Blood pressure (!) 130/107, pulse (!) 104, temperature 98.7 F (37.1 C), resp. rate 17, SpO2 97 %. Frail unkempt malnourished looking African-American male not in distress. . Afebrile. Head is nontraumatic. Neck is supple without bruit.    Cardiac exam no murmur or gallop. Lungs are clear to auscultation. Distal pulses are well felt. Neurological Exam  Awake alert oriented to place and person    Diminished attention, registration and recall.  Mildly dysarthric speech.  No aphasia.  Jaw jerk is brisk.  Weak cough and gag.  Excessive drooling of saliva.  Able to move tongue well in all directions.  Follows  commands well.  Extraocular movements are full range without nystagmus.  Blinks to threat bilaterally.  Bifacial weakness right greater than left lower half.  Tongue deviates to the right on protrusion.  Motor system exam shows no upper or lower extremity drift but mild proximal weakness bilaterally right greater than left.  Mild ankle dorsiflexor weakness in the right.  Reflexes are brisk bilaterally.  Plantars were both equivocal.  Patchy sensory loss in the left chest and medial forearm but not in a stroke distribution.  ASSESSMENT/PLAN Mr. Isrrael Fluckiger is a 59 y.o. male with history of hypertension, prior stroke (residual right-sided weakness and dysarthria), active smoking (reports 1 cigarette/day) presenting with acute onset drooling, patchy left arm and torso sensory changes, headache, cough, bifacial weakness, significantly worsened speech and gait with recent weight loss. He did not receive IV t-PA due to late presentation (>4.5 hours from time of onset).    Stroke: acute right MCA territory infarcts involving the right frontoparietal region with subcentimeter age-indeterminate infarct within the right cerebellum - embolic - source unknown.  Versus intracranial atherosclerosis   resultant increased drooling and mild left-sided weakness, significant drooling and swallowing difficulties unclear whether this represents swallowing apraxia or pseudobulbar state related dysphagia  code Stroke CT Head - not ordered  CT head - Subcentimeter age-indeterminate infarct within the right cerebellum. Multiple chronic appearing lacunar infarcts within the bilateral basal ganglia.   MRI head - Patchy small volume acute right MCA territory infarcts involving the right frontoparietal region as above. Underlying advanced cerebral atrophy and chronic microvascular ischemic disease for age, with multiple remote lacunar infarcts about the bilateral basal ganglia and right cerebellum.   MRA head - not  ordered  CTA Head - High-grade focal stenosis within the distal A2/proximal A3 left anterior cerebral artery. High-grade focal stenosis within the P2 left PCA. High-grade stenoses within the P4 right PCA.   CTA Neck - No hemodynamically significant stenosis.   CT Perfusion - not ordered  Carotid Doppler - CTA neck ordered - carotid dopplers not indicated.  2D Echo -normal ejection fraction 60 to 65%.  Sars Corona Virus 2 - negative  LDL -80 mg percent  HgbA1c -5.7   UDS - pending  VTE prophylaxis - Lovenox Diet  Diet Order            Diet NPO time specified Except for: Sips with Meds  Diet effective midnight           Diet NPO time specified  Diet effective ____           Diet full liquid Room service appropriate? No; Fluid consistency: Thin  Diet effective now                 No antithrombotic  prior to admission, now on aspirin 81 mg daily  Patient counseled to be compliant with his antithrombotic medications  Ongoing aggressive stroke risk factor management  Therapy recommendations:  pending  Disposition:  Pending  Hypertension  Home BP meds: none  Current BP meds: apresoline prn  Stable  Permissive hypertension (OK if < 220/120) but gradually normalize in 5-7 days   Long-term BP goal normotensive  Hyperlipidemia  Home Lipid lowering medication: none   LDL 80 mg percent, goal < 70  Current lipid lowering medication: Lipitor 80 mg daily  Continue statin at discharge  Other Stroke Risk Factors  Cigarette smoker - advised to stop smoking  Previous ETOH use  Obesity, There is no height or weight on file to calculate BMI., recommend weight loss, diet and exercise as appropriate   Family hx stroke - not on file  Hx stroke/TIA  Previous substance abuse  Other Active Problems  Code status - Full code  NPO  Mild bradycardia - 50's  Hospital day # 4 Patient has presented with embolic right MCA branch infarct etiology unclear as to  intracranial sclerosis or central cardiac or aortic arch source.  Recommend continue ongoing work-up check urine drug screen and hemoglobin A1c.  Continue aspirin Plavix for 3 weeks followed by aspirin alone.    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  Hopefully this will improve in the next few days otherwise will need a feeding tube for medicines and food.  Discussed with speech therapist and answered questions.. Await TEE tomorrow. Delia Heady, MD To contact Stroke Continuity provider, please refer to [email or web address]. After hours, contact General Neurology

## 2020-08-09 ENCOUNTER — Inpatient Hospital Stay (HOSPITAL_COMMUNITY): Payer: Medicaid Other | Admitting: Anesthesiology

## 2020-08-09 ENCOUNTER — Inpatient Hospital Stay (HOSPITAL_COMMUNITY): Payer: Medicaid Other

## 2020-08-09 ENCOUNTER — Encounter (HOSPITAL_COMMUNITY)
Admission: EM | Disposition: A | Discharge status: Home / Self Care | Payer: Self-pay | Source: Home / Self Care | Attending: Internal Medicine

## 2020-08-09 ENCOUNTER — Encounter (HOSPITAL_COMMUNITY): Payer: Self-pay | Admitting: Internal Medicine

## 2020-08-09 DIAGNOSIS — I10 Essential (primary) hypertension: Secondary | ICD-10-CM

## 2020-08-09 DIAGNOSIS — I639 Cerebral infarction, unspecified: Secondary | ICD-10-CM

## 2020-08-09 HISTORY — PX: TEE WITHOUT CARDIOVERSION: SHX5443

## 2020-08-09 HISTORY — PX: BUBBLE STUDY: SHX6837

## 2020-08-09 LAB — BASIC METABOLIC PANEL
Anion gap: 12 (ref 5–15)
BUN: 52 mg/dL — ABNORMAL HIGH (ref 6–20)
CO2: 22 mmol/L (ref 22–32)
Calcium: 9.9 mg/dL (ref 8.9–10.3)
Chloride: 106 mmol/L (ref 98–111)
Creatinine, Ser: 1.31 mg/dL — ABNORMAL HIGH (ref 0.61–1.24)
GFR calc Af Amer: >60 mL/min (ref >60)
GFR calc non Af Amer: 59 mL/min — ABNORMAL LOW (ref >60)
Glucose, Bld: 92 mg/dL (ref 70–99)
Potassium: 3.8 mmol/L (ref 3.5–5.1)
Sodium: 140 mmol/L (ref 135–145)

## 2020-08-09 LAB — PHOSPHORUS
Phosphorus: 3.3 mg/dL (ref 2.5–4.6)
Phosphorus: 3.2 mg/dL (ref 2.5–4.6)

## 2020-08-09 LAB — MAGNESIUM
Magnesium: 2.3 mg/dL (ref 1.7–2.4)
Magnesium: 2.3 mg/dL (ref 1.7–2.4)

## 2020-08-09 LAB — GLUCOSE, CAPILLARY
Glucose-Capillary: 120 mg/dL — ABNORMAL HIGH (ref 70–99)
Glucose-Capillary: 92 mg/dL (ref 70–99)
Glucose-Capillary: 92 mg/dL (ref 70–99)

## 2020-08-09 SURGERY — ECHOCARDIOGRAM, TRANSESOPHAGEAL
Anesthesia: Monitor Anesthesia Care

## 2020-08-09 MED ORDER — LIDOCAINE HCL (CARDIAC) PF 100 MG/5ML IV SOSY
PREFILLED_SYRINGE | INTRAVENOUS | Status: DC | PRN
  Administered 2020-08-09: 40 mg via INTRATRACHEAL

## 2020-08-09 MED ORDER — PROSOURCE TF PO LIQD
45.0000 mL | Freq: Every day | ORAL | Status: DC
  Administered 2020-08-09 – 2020-08-15 (×7): 45 mL
  Filled 2020-08-09 (×6): qty 45

## 2020-08-09 MED ORDER — OSMOLITE 1.2 CAL PO LIQD
1000.0000 mL | ORAL | Status: DC
  Administered 2020-08-09 – 2020-08-13 (×4): 1000 mL
  Filled 2020-08-09 (×12): qty 1000

## 2020-08-09 MED ORDER — PROPOFOL 500 MG/50ML IV EMUL
INTRAVENOUS | Status: DC | PRN
  Administered 2020-08-09: 100 ug/kg/min via INTRAVENOUS

## 2020-08-09 NOTE — Procedures (Signed)
Cortrak ° °Tube Type:  Cortrak - 43 inches °Tube Location:  Left nare °Initial Placement:  Stomach °Secured by: Bridle °Technique Used to Measure Tube Placement:  Documented cm marking at nare/ corner of mouth °Cortrak Secured At:  70 cm ° ° ° °Cortrak Tube Team Note: ° °Consult received to place a Cortrak feeding tube.  ° °No x-ray is required. RN may begin using tube.  ° °If the tube becomes dislodged please keep the tube and contact the Cortrak team at [email or web address] (password [email or web address]) for replacement.  °If after hours and replacement cannot be delayed, place a NG tube and confirm placement with an abdominal x-ray.  ° ° °Amalee Olsen MS, RD, LDN °Please refer to AMION for RD and/or RD on-call/weekend/after hours pager ° °

## 2020-08-09 NOTE — Transfer of Care (Signed)
Immediate Anesthesia Transfer of Care Note  Patient: Adam [name]  Procedure(s) Performed: TRANSESOPHAGEAL ECHOCARDIOGRAM (TEE) (N/A ) BUBBLE STUDY  Patient Location: Endoscopy Unit  Anesthesia Type:MAC  Level of Consciousness: sedated, drowsy and responds to stimulation  Airway & Oxygen Therapy: Patient Spontanous Breathing and Patient connected to nasal cannula oxygen  Post-op Assessment: Report given to RN and Post -op Vital signs reviewed and stable  Post vital signs: Reviewed and stable  Last Vitals:  Vitals Value Taken Time  BP 99/72 08/09/20 1110  Temp    Pulse 101 08/09/20 1110  Resp 20 08/09/20 1110  SpO2 100 % 08/09/20 1110    Last Pain:  Vitals:   08/09/20 1015  TempSrc: Axillary  PainSc: 0-No pain         Complications: No complications documented.

## 2020-08-09 NOTE — Progress Notes (Signed)
[facility]  PROGRESS NOTE  Adam KussmaulClinton [name] FAO:[id number] DOB: 04/08/1961 DOA: 08/03/2020 PCP: Patient, [name] Admit date - 08/03/2020   Admitting Physician Darlin Droparole N Hall, DO  Outpatient Primary MD for the patient is Patient, [name]  LOS - 5 Brief Narrative   Adam KussmaulClinton [name] is a 59 y.o. year old male with medical history significant for HTN, prior stroke (residual right-sided weakness and dysarthria) who presented on 08/03/2020 with acute onset drooling, left arm/torso sensory changes, headache, bifacial weakness and worsening speech and gait and was found to have acute right MCA territory infarcts.CTA of head showed high-grade focal distal stenosis within the distal A2/proximal A2 left anterior cerebral artery with high-grade focal stenosis in the P2 left PCA high-grade stenosis within the P4 right PCA.  CTA neck showed no significant stenosis.  TTE showed preserved EF.  LDL 80, A1c 5.7  Hospital course complicated by persistent drooling and decreased p.o. intake  Subjective  Today has no acute complaints.  Looking forward to TEE A & P  Acute right MCA territory infarcts, embolic event unclear source currently.  TEE with preserved EF, no masses, no PFO -Does have some left-sided weakness but able to mobilize well and does not currently need CIR, was able to discharge home we will set up for home health PT -continue aspirin, Plavix x3 weeks followed by aspirin alone per neurology -continue high intensity level statin   Increased drooling and difficulty swallowing, unclear if this is residual deficit of recent stroke/apraxia or pseudogout bulbar state related to dysphagia.  Currently having difficulty tolerating p.o. due to significant inability to control oral secretions despite diet change to liquid -Continue intensive speech therapy while inpatient -Cortrak tube placed to start tube feeds, nutrition consulted -Hopeful this will improve nutrition as we continue to monitor to  see if any improving swallow/oral secretions,  suspect patient may warrant  permanent nutrition alternative i.e. feeding tube if no improvement  B12 deficiency.  The setting of severe calorie protein malnutrition B12 90 -continue B12, thiamine, multivitamin supplementation  Hypertension, not at goal but improving.  SBP's in the 140s and DBPs in 90s . TSH within normal limits That -Started on lisinopril 10 mg on 9/15, will hold off on increasing given creatinine currently 1.3(prior baseline 0.9-1.1) -Continue HCTZ 25 mg -Started amlodipine 5 mg on 9/16   AKI, mild Baseline creatinine 0.9-1.  Currently 1. 3could be related to the recently started lisinopril , but remains stable. Could also related to decreased oral intake related to persistent drooling/inability to tolerate oral secretions -Monitor BMP -Avoid nephrotoxins  Hypokalemia, mild.  Patient is on HCTZ.  Mag within normal limits -Replete orally as allowed -Monitor BMP  Severe malnutrition.  Has fair muscle depletion on examination -Given persistent drooling and difficulty swallowing even a liquid diet starting tube feeds via cor track  -Appreciate speech and nutrition consultations     Family Communication  : Updated sister on 9/15  Code Status : Full  Disposition Plan  :  Patient is from home. Anticipated d/c date:  1 to 2 days. Barriers to d/c or necessity for inpatient status:  Starting tube feeds with core track, to assist in nutrition while assessing ability to improve oral intake related to persistent drooling inability to tolerate oral secretions/dysphagia  consults  : Neurology  Procedures  : TTE, 9/16, TEE 9/17  DVT Prophylaxis  :  Lovenox   Lab Results  Component Value Date   PLT 207 08/03/2020    Diet :  Diet Order    None       Inpatient Medications Scheduled Meds:  amLODipine  5 mg Oral Daily   aspirin EC  81 mg Oral Daily   Or   aspirin  300 mg Rectal Daily   atorvastatin  80 mg Oral  Daily   clopidogrel  75 mg Oral Daily   enoxaparin (LOVENOX) injection  40 mg Subcutaneous Daily   feeding supplement (ENSURE ENLIVE)  237 mL Oral TID BM   hydrochlorothiazide  25 mg Oral Daily   lisinopril  10 mg Oral Daily   multivitamins with iron  1 tablet Oral Daily   thiamine  100 mg Oral Daily   vitamin B-12  1,000 mcg Oral Daily   Continuous Infusions:  PRN Meds:.hydrALAZINE, Resource ThickenUp Clear  Antibiotics  :   Anti-infectives (From admission, onward)   None       Objective   Vitals:   08/09/20 1015 08/09/20 1110 08/09/20 1120 08/09/20 1130  BP: (!) 139/97 99/72 116/79 (!) 145/97  Pulse: 89 (!) 101 96 93  Resp: 19 20 (!) 26 17  Temp: 97.9 F (36.6 C) (!) 96.7 F (35.9 C)    TempSrc: Axillary Axillary    SpO2: 97% 100% 100% 99%    SpO2: 99 % O2 Flow Rate (L/min): 2 L/min  Wt Readings from Last 3 Encounters:  No data found for Wt     Intake/Output Summary (Last 24 hours) at 08/09/2020 1354 Last data filed at 08/09/2020 0400 Gross per 24 hour  Intake 217.61 ml  Output --  Net 217.61 ml    Physical Exam:     Awake Alert, Oriented X 3, Very thin/emaciated, frail gentleman Difficulty speaking given persistent drooling No new F.N deficits,  [place].AT, Normal respiratory effort on room air, CTAB RRR,No Gallops,Rubs or new Murmurs,  +ve B.Sounds, Abd Soft, No tenderness, No rebound, guarding or rigidity. No Cyanosis, No new Rash or bruise   I have personally reviewed the following:   Data Reviewed:  CBC Recent Labs  Lab 08/03/20 1427 08/03/20 1438  WBC 5.2  --   HGB 13.6 13.9  HCT 41.0 41.0  PLT 207  --   MCV 74.5*  --   MCH 24.7*  --   MCHC 33.2  --   RDW 15.5  --   LYMPHSABS 1.7  --   MONOABS 0.4  --   EOSABS 0.0  --   BASOSABS 0.0  --     Chemistries  Recent Labs  Lab 08/03/20 1427 08/03/20 1438 08/07/20 1101 08/08/20 0135 08/08/20 0927 08/09/20 0101  NA 142 145 144 141  --  140  K 3.6 3.7 3.4* 3.3*  --   3.8  CL 107 109 107 104  --  106  CO2 21*  --  24 23  --  22  GLUCOSE 87 82 105* 93  --  92  BUN 29* 30* 39* 45*  --  52*  CREATININE 1.10 0.90 1.30* 1.27*  --  1.31*  CALCIUM 9.7  --  10.4* 9.8  --  9.9  MG  --   --   --   --  2.3  --   AST 16  --   --   --   --   --   ALT 11  --   --   --   --   --   ALKPHOS 76  --   --   --   --   --  BILITOT 0.8  --   --   --   --   --    ------------------------------------------------------------------------------------------------------------------ No results for input(s): CHOL, HDL, LDLCALC, TRIG, CHOLHDL, LDLDIRECT in the last 72 hours.  Lab Results  Component Value Date   HGBA1C 5.7 (H) 08/06/2020   ------------------------------------------------------------------------------------------------------------------ Recent Labs    08/07/20 1101  TSH 1.347   ------------------------------------------------------------------------------------------------------------------ No results for input(s): VITAMINB12, FOLATE, FERRITIN, TIBC, IRON, RETICCTPCT in the last 72 hours.  Coagulation profile Recent Labs  Lab 08/03/20 1427  INR 1.1    No results for input(s): DDIMER in the last 72 hours.  Cardiac Enzymes No results for input(s): CKMB, TROPONINI, MYOGLOBIN in the last 168 hours.  Invalid input(s): CK ------------------------------------------------------------------------------------------------------------------ No results found for: BNP  Micro Results Recent Results (from the past 240 hour(s))  SARS Coronavirus 2 by RT PCR (hospital order, performed in [facility] hospital lab) Nasopharyngeal Nasopharyngeal Swab     Status: None   Collection Time: 08/04/20  3:22 AM   Specimen: Nasopharyngeal Swab  Result Value Ref Range Status   SARS Coronavirus 2 NEGATIVE NEGATIVE Final    Comment: (NOTE) SARS-CoV-2 target nucleic acids are NOT DETECTED.  The SARS-CoV-2 RNA is generally detectable in upper and lower respiratory specimens  during the acute phase of infection. The lowest concentration of SARS-CoV-2 viral copies this assay can detect is 250 copies / mL. A negative result does not preclude SARS-CoV-2 infection and should not be used as the sole basis for treatment or other patient management decisions.  A negative result may occur with improper specimen collection / handling, submission of specimen other than nasopharyngeal swab, presence of viral mutation(s) within the areas targeted by this assay, and inadequate number of viral copies (<250 copies / mL). A negative result must be combined with clinical observations, patient history, and epidemiological information.  Fact Sheet for Patients:   BoilerBrush.com.cy  Fact Sheet for Healthcare Providers: https://pope.com/  This test is not yet approved or  cleared by the [place] FDA and has been authorized for detection and/or diagnosis of SARS-CoV-2 by FDA under an Emergency Use Authorization (EUA).  This EUA will remain in effect (meaning this test can be used) for the duration of the COVID-19 declaration under Section 564(b)(1) of the Act, 21 U.S.C. section 360bbb-3(b)(1), unless the authorization is terminated or revoked sooner.  Performed at [facility], [address] [place], [place] [address]     Radiology Reports CT Angio Head W or [place] Contrast  Result Date: 08/03/2020 CLINICAL DATA:  Neuro deficit, acute, stroke suspected. Additional history provided: Numbness yesterday. Drooling. EXAM: CT ANGIOGRAPHY HEAD AND NECK TECHNIQUE: Multidetector CT imaging of the head and neck was performed using the standard protocol during bolus administration of intravenous contrast. Multiplanar CT image reconstructions and MIPs were obtained to evaluate the vascular anatomy. Carotid stenosis measurements (when applicable) are obtained utilizing NASCET criteria, using the distal internal carotid diameter as  the denominator. CONTRAST:  75mL OMNIPAQUE IOHEXOL 350 MG/ML SOLN COMPARISON:  No pertinent prior exams are available for comparison. FINDINGS: CT HEAD FINDINGS Brain: Mild generalized parenchymal atrophy. There are multiple chronic appearing lacunar infarcts within the basal ganglia bilaterally. Background advanced ill-defined hypoattenuation within the cerebral white matter is nonspecific, but consistent with chronic small vessel ischemic disease. Subcentimeter age-indeterminate infarct within right cerebellar hemisphere (series 5, image 13). There is no acute intracranial hemorrhage. No demarcated cortical infarct is identified. No extra-axial fluid collection. No evidence of intracranial mass. No midline shift.  Vascular: Reported below. Skull: Normal. Negative for fracture or focal lesion. Sinuses: No significant paranasal sinus disease or mastoid effusion at the imaged levels. Orbits: No acute finding. Chronic fracture deformity of the right lamina papyracea. Review of the MIP images confirms the above findings CTA NECK FINDINGS Aortic arch: Common origin of the innominate and left common carotid arteries. The visualized aortic arch is unremarkable. No hemodynamically significant innominate or proximal subclavian artery stenosis. Right carotid system: CCA and ICA patent within the neck without stenosis. Left carotid system: CCA and ICA patent within the neck without stenosis. Vertebral arteries: Codominant and patent within the neck without stenosis Skeleton: No acute bony abnormality or aggressive osseous lesion. Cervical spondylosis with multilevel disc space narrowing, disc bulges, uncovertebral and facet hypertrophy. Other neck: No neck mass or cervical lymphadenopathy. Thyroid unremarkable. Upper chest: No consolidation within the imaged lung apices. Review of the MIP images confirms the above findings CTA HEAD FINDINGS Anterior circulation: The intracranial internal carotid arteries are patent. The M1  middle cerebral arteries are patent. There is moderate stenosis within the distal M1 right middle cerebral artery. No definite right M2 proximal branch occlusion is identified. However, there is a paucity of visualized proximal superior division right M2 MCA branches. The right anterior cerebral artery is patent without significant proximal stenosis. There is a high-grade focal stenosis within the distal A2/proximal A3 left anterior cerebral artery. No intracranial aneurysm is identified. Posterior circulation: The intracranial vertebral arteries are patent. The basilar artery is patent. The posterior cerebral arteries are patent. There is a high-grade focal stenosis within the P2 left PCA. Additionally, there are multifocal high-grade stenoses within the P4 right PCA. Posterior communicating arteries are present bilaterally. Venous sinuses: Within limitations of contrast timing, no convincing thrombus. Anatomic variants: None significant. Review of the MIP images confirms the above findings These results were called by telephone at the time of interpretation on 08/03/2020 at 8:28 pm to provider [facility] , who verbally acknowledged these results. IMPRESSION: CT head: 1. Subcentimeter age-indeterminate infarct within the right cerebellum. 2. Multiple chronic appearing lacunar infarcts within the bilateral basal ganglia. 3. Background mild generalized parenchymal atrophy with advanced chronic small vessel ischemic disease. CTA neck: The common carotid, internal carotid and vertebral arteries are patent within the neck without hemodynamically significant stenosis. CTA head: 1. No definite proximal right M2 branch occlusion is identified. However, there is a paucity of visualized proximal M2 superior division right MCA branch vessels. If there is concern for an acute right MCA territory stroke, consider catheter based angiography for further evaluation. 2. Intracranial atherosclerotic disease with multifocal  stenoses, most notably as follows. 3. Moderate stenosis within the distal M1 right middle cerebral artery. 4. High-grade focal stenosis within the distal A2/proximal A3 left anterior cerebral artery. 5. High-grade focal stenosis within the P2 left PCA. 6. High-grade stenoses within the P4 right PCA. Electronically Signed   By: Jackey Loge DO   On: 08/03/2020 20:29   CT ANGIO NECK W OR WO CONTRAST  Result Date: 08/03/2020 CLINICAL DATA:  Neuro deficit, acute, stroke suspected. Additional history provided: Numbness yesterday. Drooling. EXAM: CT ANGIOGRAPHY HEAD AND NECK TECHNIQUE: Multidetector CT imaging of the head and neck was performed using the standard protocol during bolus administration of intravenous contrast. Multiplanar CT image reconstructions and MIPs were obtained to evaluate the vascular anatomy. Carotid stenosis measurements (when applicable) are obtained utilizing NASCET criteria, using the distal internal carotid diameter as the denominator. CONTRAST:  75mL OMNIPAQUE IOHEXOL 350 MG/ML  SOLN COMPARISON:  No pertinent prior exams are available for comparison. FINDINGS: CT HEAD FINDINGS Brain: Mild generalized parenchymal atrophy. There are multiple chronic appearing lacunar infarcts within the basal ganglia bilaterally. Background advanced ill-defined hypoattenuation within the cerebral white matter is nonspecific, but consistent with chronic small vessel ischemic disease. Subcentimeter age-indeterminate infarct within right cerebellar hemisphere (series 5, image 13). There is no acute intracranial hemorrhage. No demarcated cortical infarct is identified. No extra-axial fluid collection. No evidence of intracranial mass. No midline shift. Vascular: Reported below. Skull: Normal. Negative for fracture or focal lesion. Sinuses: No significant paranasal sinus disease or mastoid effusion at the imaged levels. Orbits: No acute finding. Chronic fracture deformity of the right lamina papyracea. Review of  the MIP images confirms the above findings CTA NECK FINDINGS Aortic arch: Common origin of the innominate and left common carotid arteries. The visualized aortic arch is unremarkable. No hemodynamically significant innominate or proximal subclavian artery stenosis. Right carotid system: CCA and ICA patent within the neck without stenosis. Left carotid system: CCA and ICA patent within the neck without stenosis. Vertebral arteries: Codominant and patent within the neck without stenosis Skeleton: No acute bony abnormality or aggressive osseous lesion. Cervical spondylosis with multilevel disc space narrowing, disc bulges, uncovertebral and facet hypertrophy. Other neck: No neck mass or cervical lymphadenopathy. Thyroid unremarkable. Upper chest: No consolidation within the imaged lung apices. Review of the MIP images confirms the above findings CTA HEAD FINDINGS Anterior circulation: The intracranial internal carotid arteries are patent. The M1 middle cerebral arteries are patent. There is moderate stenosis within the distal M1 right middle cerebral artery. No definite right M2 proximal branch occlusion is identified. However, there is a paucity of visualized proximal superior division right M2 MCA branches. The right anterior cerebral artery is patent without significant proximal stenosis. There is a high-grade focal stenosis within the distal A2/proximal A3 left anterior cerebral artery. No intracranial aneurysm is identified. Posterior circulation: The intracranial vertebral arteries are patent. The basilar artery is patent. The posterior cerebral arteries are patent. There is a high-grade focal stenosis within the P2 left PCA. Additionally, there are multifocal high-grade stenoses within the P4 right PCA. Posterior communicating arteries are present bilaterally. Venous sinuses: Within limitations of contrast timing, no convincing thrombus. Anatomic variants: None significant. Review of the MIP images confirms the  above findings These results were called by telephone at the time of interpretation on 08/03/2020 at 8:28 pm to provider [facility] , who verbally acknowledged these results. IMPRESSION: CT head: 1. Subcentimeter age-indeterminate infarct within the right cerebellum. 2. Multiple chronic appearing lacunar infarcts within the bilateral basal ganglia. 3. Background mild generalized parenchymal atrophy with advanced chronic small vessel ischemic disease. CTA neck: The common carotid, internal carotid and vertebral arteries are patent within the neck without hemodynamically significant stenosis. CTA head: 1. No definite proximal right M2 branch occlusion is identified. However, there is a paucity of visualized proximal M2 superior division right MCA branch vessels. If there is concern for an acute right MCA territory stroke, consider catheter based angiography for further evaluation. 2. Intracranial atherosclerotic disease with multifocal stenoses, most notably as follows. 3. Moderate stenosis within the distal M1 right middle cerebral artery. 4. High-grade focal stenosis within the distal A2/proximal A3 left anterior cerebral artery. 5. High-grade focal stenosis within the P2 left PCA. 6. High-grade stenoses within the P4 right PCA. Electronically Signed   By: Jackey Loge DO   On: 08/03/2020 20:29   MR Brain W and Ilda [place]  Contrast  Result Date: 08/04/2020 CLINICAL DATA:  Initial evaluation for neuro deficit, stroke suspected. EXAM: MRI HEAD WITHOUT AND WITH CONTRAST TECHNIQUE: Multiplanar, multiecho pulse sequences of the brain and surrounding structures were obtained without and with intravenous contrast. CONTRAST:  6mL GADAVIST GADOBUTROL 1 MMOL/ML IV SOLN COMPARISON:  Comparison made with prior CTA from 08/03/2020. FINDINGS: Brain: Advanced cerebral atrophy for age. Patchy and confluent T2/FLAIR hyperintensity within the periventricular deep white matter both cerebral hemispheres, most consistent with chronic  microvascular ischemic disease, advanced for age. Multiple scattered remote lacunar infarcts present about the bilateral basal ganglia. Associated chronic hemosiderin staining noted about several of these infarcts. Additionally, there are multiple small remote right cerebellar infarcts. Patchy small volume restricted diffusion seen involving the cortical and subcortical right frontal parietal region, consistent with acute right MCA territory infarcts. Involvement of the pre and postcentral gyri noted. Largest area of infarction seen at the postcentral gyrus and measures up to 1.6 cm in diameter. No associated hemorrhage or mass effect. Gray-white matter differentiation otherwise maintained. No acute intracranial hemorrhage elsewhere within the brain. No mass lesion, midline shift or mass effect. Ventricles normal size without hydrocephalus. No extra-axial fluid collection. Pituitary gland and suprasellar region within normal limits. No visible parenchymal signal abnormality seen about the medulla or brainstem with thin section imaging. No abnormal enhancement. Vascular: Major intracranial vascular flow voids are maintained. Skull and upper cervical spine: Craniocervical junction within normal limits. Bone marrow signal intensity within normal limits. No focal marrow replacing lesion. No scalp soft tissue abnormality. Sinuses/Orbits: Globes and orbital soft tissues demonstrate no acute finding. Mild scattered mucosal thickening noted within the sphenoid ethmoidal sinuses. No mastoid effusion. Inner ear structures grossly normal. Other: None. IMPRESSION: 1. Patchy small volume acute right MCA territory infarcts involving the right frontoparietal region as above. No associated hemorrhage or mass effect. 2. Underlying advanced cerebral atrophy and chronic microvascular ischemic disease for age, with multiple remote lacunar infarcts about the bilateral basal ganglia and right cerebellum. Electronically Signed   By:  Rise Mu M.D.   On: 08/04/2020 02:18   DG Swallowing Func-Speech Pathology  Result Date: 08/05/2020 Objective Swallowing Evaluation: Type of Study: MBS-Modified Barium Swallow Study  Patient Details Name: Adam [name] MRN: [id number] Date of Birth: 1960/12/26 Today's Date: 08/05/2020 Time: SLP Start Time (ACUTE ONLY): 3086 -SLP Stop Time (ACUTE ONLY): 0902 SLP Time Calculation (min) (ACUTE ONLY): 23 min Past Medical History: No past medical history on file. Past Surgical History: No past surgical history on file. HPI: Patient is a 59 y.o. 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 Per chart review, his sister had reported she noticed bifacial weakness, significant worsened speech and significantly worsened gait. MRI brain revealed patchy small volume acute right MCA territory infarcts involving right frontoparietal region but no associated hemorrhage or mass effect, as well as underlying advanced cerebral atrophy and chronic microvascular ischemic disease, multiple remote lacunar infarcts about the basal ganglia and right cerebellum.  Assessment / Plan / Recommendation CHL IP CLINICAL IMPRESSIONS 08/05/2020 Clinical Impression Pt presents with oropharyngeal dysphagia characterized by reduced labial seal, impaired bolus propulsion, impaired bolus cohesion, reduced lingual retraction, reduced anterior laryngeal movement, and a pharyngeal delay. He demonstrated anterior spillage, oral holding, lingual pumping, piecemeal deglutition, premature spillage, vallecular residue, pyriform sinus residue, penetration (PAS 3) of thin liquids and silent aspiration (PAS 8) of nectar thick  liquids secondary to the pharyngeal delay. Pt's most significant impairment is oral holding with it sometimes being over a minute in length. Pt required verbal and tactile cues to initiate a  swallow and anterior spillage was still noted intermittently when pt attempted to swallow.  Pt's swallow was most timely with with puree solids and honey thick liquids via cup. Amount of pharyngeal residue increased with bolus sizes. 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 Pt is safest with his current diet of dysphagia 1 (puree) solids and honey thick liquids. However, considering the level of support required for swallowing of limited boluses during the study, it is anticipated that may not be able to maintain adequate nutrition or hydration p.o. and that supplemental nonoral nutrition may be needed in the future. SLP will follow for dysphagia treatment.  SLP Visit Diagnosis Dysphagia, oropharyngeal phase (R13.12) Attention and concentration deficit following -- Frontal lobe and executive function deficit following -- Impact on safety and function Mild aspiration risk;Moderate aspiration risk   [facility] IP TREATMENT RECOMMENDATION 08/05/2020 Treatment Recommendations Therapy as outlined in treatment plan below   Prognosis 08/05/2020 Prognosis for Safe Diet Advancement Good Barriers to Reach Goals Cognitive deficits;Severity of deficits Barriers/Prognosis Comment -- [facility] IP DIET RECOMMENDATION 08/05/2020 SLP Diet Recommendations Honey thick liquids;Dysphagia 1 (Puree) solids Liquid Administration via Cup;Straw Medication Administration Crushed with puree Compensations Slow rate;Small sips/bites;Minimize environmental distractions;Follow solids with liquid;Other (Comment) Postural Changes Remain semi-upright after after feeds/meals (Comment)   [facility] IP OTHER RECOMMENDATIONS 08/05/2020 Recommended Consults -- Oral Care Recommendations Oral care BID Other Recommendations --   [facility] IP FOLLOW UP RECOMMENDATIONS 08/05/2020 Follow up Recommendations Inpatient Rehab   [facility] IP FREQUENCY AND DURATION 08/05/2020 Speech Therapy Frequency (ACUTE ONLY) min 2x/week Treatment Duration 2 weeks       [facility] IP ORAL PHASE 08/05/2020 Oral Phase Impaired Oral - Pudding Teaspoon -- Oral - Pudding Cup -- Oral - Honey Teaspoon -- Oral - Honey Cup Reduced posterior propulsion;Holding of bolus;Piecemeal swallowing;Right anterior bolus loss;Left anterior bolus loss;Lingual pumping;Delayed oral transit;Decreased bolus cohesion;Premature spillage Oral - Nectar Teaspoon -- Oral - Nectar Cup Reduced posterior propulsion;Holding of bolus;Piecemeal swallowing;Right anterior bolus loss;Left anterior bolus loss;Lingual pumping;Delayed oral transit;Decreased bolus cohesion;Premature spillage Oral - Nectar Straw Reduced posterior propulsion;Holding of bolus;Piecemeal swallowing;Right anterior bolus loss;Left anterior bolus loss;Lingual pumping;Delayed oral transit;Decreased bolus cohesion;Premature spillage Oral - Thin Teaspoon -- Oral - Thin Cup Reduced posterior propulsion;Holding of bolus;Piecemeal swallowing;Right anterior bolus loss;Left anterior bolus loss;Lingual pumping;Delayed oral transit;Decreased bolus cohesion;Premature spillage Oral - Thin Straw Reduced posterior propulsion;Holding of bolus;Piecemeal swallowing;Right anterior bolus loss;Left anterior bolus loss;Lingual pumping;Delayed oral transit;Decreased bolus cohesion;Premature spillage Oral - Puree Reduced posterior propulsion;Holding of bolus;Piecemeal swallowing;Right anterior bolus loss;Left anterior bolus loss;Lingual pumping;Delayed oral transit;Decreased bolus cohesion;Premature spillage Oral - Mech Soft -- Oral - Regular -- Oral - Multi-Consistency -- Oral - Pill -- Oral Phase - Comment --  [facility] IP PHARYNGEAL PHASE 08/05/2020 Pharyngeal Phase Impaired Pharyngeal- Pudding Teaspoon -- Pharyngeal -- Pharyngeal- Pudding Cup -- Pharyngeal -- Pharyngeal- Honey Teaspoon Reduced anterior laryngeal mobility;Delayed swallow initiation-vallecula;Delayed swallow initiation-pyriform sinuses;Pharyngeal residue - valleculae;Pharyngeal residue - pyriform;Reduced tongue base  retraction Pharyngeal -- Pharyngeal- Honey Cup -- Pharyngeal -- Pharyngeal- Nectar Teaspoon -- Pharyngeal -- Pharyngeal- Nectar Cup Reduced anterior laryngeal mobility;Delayed swallow initiation-vallecula;Delayed swallow initiation-pyriform sinuses;Pharyngeal residue - valleculae;Pharyngeal residue - pyriform;Reduced tongue base retraction;Penetration/Aspiration before swallow Pharyngeal Material enters airway, passes BELOW cords without attempt by patient to eject out (silent aspiration) Pharyngeal- Nectar Straw Reduced anterior laryngeal mobility;Delayed swallow  initiation-vallecula;Delayed swallow initiation-pyriform sinuses;Pharyngeal residue - valleculae;Pharyngeal residue - pyriform;Reduced tongue base retraction Pharyngeal -- Pharyngeal- Thin Teaspoon -- Pharyngeal -- Pharyngeal- Thin Cup Reduced anterior laryngeal mobility;Delayed swallow initiation-vallecula;Delayed swallow initiation-pyriform sinuses;Pharyngeal residue - valleculae;Pharyngeal residue - pyriform;Reduced tongue base retraction;Penetration/Aspiration during swallow Pharyngeal Material enters airway, remains ABOVE vocal cords and not ejected out Pharyngeal- Thin Straw Reduced anterior laryngeal mobility;Delayed swallow initiation-vallecula;Delayed swallow initiation-pyriform sinuses;Pharyngeal residue - valleculae;Pharyngeal residue - pyriform;Reduced tongue base retraction Pharyngeal -- Pharyngeal- Puree Reduced anterior laryngeal mobility;Delayed swallow initiation-vallecula;Delayed swallow initiation-pyriform sinuses;Pharyngeal residue - valleculae;Pharyngeal residue - pyriform;Reduced tongue base retraction Pharyngeal -- Pharyngeal- Mechanical Soft -- Pharyngeal -- Pharyngeal- Regular -- Pharyngeal -- Pharyngeal- Multi-consistency -- Pharyngeal -- Pharyngeal- Pill -- Pharyngeal -- Pharyngeal Comment --  No flowsheet data found. Shanika I. Vear Clock, MS, CCC-SLP Acute Rehabilitation Services Office number [phone] Pager [phone]  Scheryl Marten 08/05/2020, 10:11 AM              VAS [place] TRANSCRANIAL DOPPLER W BUBBLES  Result Date: 08/06/2020  Transcranial Doppler with Bubble Indications: Stroke. Comparison Study: No prior study Performing Technologist: Gertie Fey MHA, RDMS, RVT, RDCS  Examination Guidelines: A complete evaluation includes B-mode imaging, spectral Doppler, color Doppler, and power Doppler as needed of all accessible portions of each vessel. Bilateral testing is considered an integral part of a complete examination. Limited examinations for reoccurring indications may be performed as noted.  Summary: No HITS at rest or during Valsalva. Negative transcranial Doppler Bubble study with no evidence of right to left intracardiac communication.  A vascular evaluation was performed. The right middle cerebral artery was studied. An IV was inserted into the patient's right forearm. Verbal informed consent was obtained.  Negative TCD Bubble study *See table(s) above for TCD measurements and observations.  Diagnosing physician: Delia Heady MD Electronically signed by Delia Heady MD on 08/06/2020 at 12:32:56 PM.    Final    ECHOCARDIOGRAM COMPLETE BUBBLE STUDY  Result Date: 08/04/2020    ECHOCARDIOGRAM REPORT   Patient Name:   Adam [name] Date of Exam: 08/04/2020 Medical Rec #:  [id number]      Height: Accession #:    [id number]     Weight: Date of Birth:  02-29-1961      BSA: Patient Age:    58 years       BP:           171/70 mmHg Patient Gender: M              HR:           55 bpm. Exam Location:  Inpatient Procedure: 2D Echo Indications:    Stroke 434.91 / I63.9  History:        Patient has no prior history of Echocardiogram examinations.                 Stroke; Risk Factors:Hypertension and Current Smoker.  Sonographer:    Jeryl Columbia Referring Phys: [id number] CAROLE N HALL IMPRESSIONS  1. Left ventricular ejection fraction, by estimation, is 60 to 65%. The left ventricle has normal function. The left ventricle  has no regional wall motion abnormalities. Left ventricular diastolic parameters were normal.  2. Right ventricular systolic function is normal. The right ventricular size is normal.  3. The mitral valve is normal in structure. Trivial mitral valve regurgitation. No evidence of mitral stenosis.  4. The aortic valve is normal in structure. Aortic valve regurgitation is not visualized. No aortic stenosis is present.  5. The inferior vena cava is  normal in size with greater than 50% respiratory variability, suggesting right atrial pressure of 3 mmHg.  6. Agitated saline contrast bubble study was negative, with no evidence of any interatrial shunt. FINDINGS  Left Ventricle: Left ventricular ejection fraction, by estimation, is 60 to 65%. The left ventricle has normal function. The left ventricle has no regional wall motion abnormalities. The left ventricular internal cavity size was normal in size. There is  no left ventricular hypertrophy. Left ventricular diastolic parameters were normal. Right Ventricle: The right ventricular size is normal. No increase in right ventricular wall thickness. Right ventricular systolic function is normal. Left Atrium: Left atrial size was normal in size. Right Atrium: Right atrial size was normal in size. Pericardium: There is no evidence of pericardial effusion. Mitral Valve: The mitral valve is normal in structure. There is mild thickening of the mitral valve leaflet(s). Trivial mitral valve regurgitation. No evidence of mitral valve stenosis. Tricuspid Valve: The tricuspid valve is normal in structure. Tricuspid valve regurgitation is not demonstrated. No evidence of tricuspid stenosis. Aortic Valve: The aortic valve is normal in structure. Aortic valve regurgitation is not visualized. No aortic stenosis is present. Pulmonic Valve: The pulmonic valve was normal in structure. Pulmonic valve regurgitation is not visualized. No evidence of pulmonic stenosis. Aorta: The aortic root is  normal in size and structure. Venous: The inferior vena cava is normal in size with greater than 50% respiratory variability, suggesting right atrial pressure of 3 mmHg. IAS/Shunts: No atrial level shunt detected by color flow Doppler. Agitated saline contrast was given intravenously to evaluate for intracardiac shunting. Agitated saline contrast bubble study was negative, with no evidence of any interatrial shunt. Charlton Haws MD Electronically signed by Charlton Haws MD Signature Date/Time: 08/04/2020/3:38:04 PM    Final      Time Spent in minutes > 30     Laverna Peace M.D on 08/09/2020 at 1:54 PM  To page go to [email or web address] - password [place]

## 2020-08-09 NOTE — Progress Notes (Signed)
STROKE TEAM PROGRESS NOTE    INTERVAL HISTORY Patient examined in PACU post TEE.  Is awake alert and interactive.  TEE was negative for cardiac source of embolism PFO or clot.  Continues to have dysphagia and drooling.  OBJECTIVE Vitals:   08/09/20 1015 08/09/20 1110 08/09/20 1120 08/09/20 1130  BP: (!) 139/97 99/72 116/79 (!) 145/97  Pulse: 89 (!) 101 96 93  Resp: 19 20 (!) 26 17  Temp: 97.9 F (36.6 C) (!) 96.7 F (35.9 C)    TempSrc: Axillary Axillary    SpO2: 97% 100% 100% 99%    CBC:  Recent Labs  Lab 08/03/20 1427 08/03/20 1438  WBC 5.2  --   NEUTROABS 3.1  --   HGB 13.6 13.9  HCT 41.0 41.0  MCV 74.5*  --   PLT 207  --     Basic Metabolic Panel:  Recent Labs  Lab 08/08/20 0135 08/08/20 0927 08/09/20 0101  NA 141  --  140  K 3.3*  --  3.8  CL 104  --  106  CO2 23  --  22  GLUCOSE 93  --  92  BUN 45*  --  52*  CREATININE 1.27*  --  1.31*  CALCIUM 9.8  --  9.9  MG  --  2.3  --     Lipid Panel:     Component Value Date/Time   CHOL 123 08/04/2020 1142   TRIG 49 08/04/2020 1142   HDL 33 (L) 08/04/2020 1142   CHOLHDL 3.7 08/04/2020 1142   VLDL 10 08/04/2020 1142   LDLCALC 80 08/04/2020 1142   HgbA1c:  Lab Results  Component Value Date   HGBA1C 5.7 (H) 08/06/2020   Urine Drug Screen: No results found for: LABOPIA, COCAINSCRNUR, LABBENZ, AMPHETMU, THCU, LABBARB  Alcohol Level     Component Value Date/Time   ETH <10 08/03/2020 2354    IMAGING  CT HEAD CT Angio Head W or Wo Contrast CT ANGIO NECK W OR WO CONTRAST 08/03/2020 IMPRESSION:   CT head:  1. Subcentimeter age-indeterminate infarct within the right cerebellum.  2. Multiple chronic appearing lacunar infarcts within the bilateral basal ganglia.  3. Background mild generalized parenchymal atrophy with advanced chronic small vessel ischemic disease.   CTA neck:  The common carotid, internal carotid and vertebral arteries are patent within the neck without hemodynamically significant  stenosis.   CTA head:  1. No definite proximal right M2 branch occlusion is identified. However, there is a paucity of visualized proximal M2 superior division right MCA branch vessels. If there is concern for an acute right MCA territory stroke, consider catheter based angiography for further evaluation.  2. Intracranial atherosclerotic disease with multifocal stenoses, most notably as follows.  3. Moderate stenosis within the distal M1 right middle cerebral artery.  4. High-grade focal stenosis within the distal A2/proximal A3 left anterior cerebral artery.  5. High-grade focal stenosis within the P2 left PCA.  6. High-grade stenoses within the P4 right PCA.   MR Brain W and Wo Contrast 08/04/2020 IMPRESSION:  1. Patchy small volume acute right MCA territory infarcts involving the right frontoparietal region as above. No associated hemorrhage or mass effect.  2. Underlying advanced cerebral atrophy and chronic microvascular ischemic disease for age, with multiple remote lacunar infarcts about the bilateral basal ganglia and right cerebellum.   Transthoracic Echocardiogram  Normal ejection fraction 60 to 65%.  No cardiac source of embolism  ECG - SR rate 82 BPM. (See cardiology reading for  complete details)  PHYSICAL EXAM Blood pressure (!) 145/97, pulse 93, temperature (!) 96.7 F (35.9 C), temperature source Axillary, resp. rate 17, SpO2 99 %. Frail unkempt malnourished looking African-American male not in distress. . Afebrile. Head is nontraumatic. Neck is supple without bruit.    Cardiac exam no murmur or gallop. Lungs are clear to auscultation. Distal pulses are well felt. Neurological Exam  Awake alert oriented to place and person    Diminished attention, registration and recall.  Mildly dysarthric speech.  No aphasia.  Jaw jerk is brisk.  Weak cough and gag.  Excessive drooling of saliva.  Able to move tongue well in all directions.  Follows commands well.  Extraocular movements are  full range without nystagmus.  Blinks to threat bilaterally.  Bifacial weakness right greater than left lower half.  Tongue deviates to the right on protrusion.  Motor system exam shows no upper or lower extremity drift but mild proximal weakness bilaterally right greater than left.  Mild ankle dorsiflexor weakness in the right.  Reflexes are brisk bilaterally.  Plantars were both equivocal.  Patchy sensory loss in the left chest and medial forearm but not in a stroke distribution.  ASSESSMENT/PLAN Adam [name] is a 59 y.o. male with history of hypertension, prior stroke (residual right-sided weakness and dysarthria), active smoking (reports 1 cigarette/day) presenting with acute onset drooling, patchy left arm and torso sensory changes, headache, cough, bifacial weakness, significantly worsened speech and gait with recent weight loss. He did not receive IV t-PA due to late presentation (>4.5 hours from time of onset).    Stroke: acute right MCA territory infarcts involving the right frontoparietal region with subcentimeter age-indeterminate infarct within the right cerebellum - embolic - source unknown.  Versus intracranial atherosclerosis   resultant increased drooling and mild left-sided weakness, significant drooling and swallowing difficulties unclear whether this represents swallowing apraxia or pseudobulbar state related dysphagia  code Stroke CT Head - not ordered  CT head - Subcentimeter age-indeterminate infarct within the right cerebellum. Multiple chronic appearing lacunar infarcts within the bilateral basal ganglia.   MRI head - Patchy small volume acute right MCA territory infarcts involving the right frontoparietal region as above. Underlying advanced cerebral atrophy and chronic microvascular ischemic disease for age, with multiple remote lacunar infarcts about the bilateral basal ganglia and right cerebellum.   MRA head - not ordered  CTA Head - High-grade focal stenosis  within the distal A2/proximal A3 left anterior cerebral artery. High-grade focal stenosis within the P2 left PCA. High-grade stenoses within the P4 right PCA.   CTA Neck - No hemodynamically significant stenosis.   CT Perfusion - not ordered  Carotid Doppler - CTA neck ordered - carotid dopplers not indicated.  2D Echo -normal ejection fraction 60 to 65%.  Sars Corona Virus 2 - negative  LDL -80 mg percent  HgbA1c -5.7   UDS - pending  VTE prophylaxis - Lovenox Diet  Diet Order    None      No antithrombotic prior to admission, now on aspirin 81 mg daily  Patient counseled to be compliant with his antithrombotic medications  Ongoing aggressive stroke risk factor management  Therapy recommendations:  pending  Disposition:  Pending  Hypertension  Home BP meds: none  Current BP meds: apresoline prn  Stable . Permissive hypertension (OK if < 220/120) but gradually normalize in 5-7 days  . Long-term BP goal normotensive  Hyperlipidemia  Home Lipid lowering medication: none   LDL 80 mg percent, goal <  70  Current lipid lowering medication: Lipitor 80 mg daily  Continue statin at discharge  Other Stroke Risk Factors  Cigarette smoker - advised to stop smoking  Previous ETOH use  Obesity, There is no height or weight on file to calculate BMI., recommend weight loss, diet and exercise as appropriate   Family hx stroke - not on file  Hx stroke/TIA  Previous substance abuse  Other Active Problems  Code status - Full code  NPO  Mild bradycardia - 50's  Hospital day # 5 Patient has presented with embolic right MCA branch infarct etiology unclear as to intracranial sclerosis or central cardiac or aortic arch source.  Recommend   check urine drug screen    Continue aspirin Plavix for 3 weeks followed by aspirin alone.    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  Agree with core track tube and if swallow function does not improve by  next week may consider PEG tube.Delia Heady, MD To contact Stroke Continuity provider, please refer to [email or web address]. After hours, contact General Neurology

## 2020-08-09 NOTE — Progress Notes (Signed)
  Echocardiogram Echocardiogram Transesophageal has been performed.  Stark Bray Swaim 08/09/2020, 11:09 AM

## 2020-08-09 NOTE — Progress Notes (Signed)
Occupational Therapy Treatment Patient Details Name: Adam [name] MRN: [id number] DOB: [birth year] Today's Date: 08/09/2020    History of present illness 59 y.o. male with medical history significant for essential hypertension, prior CVA in 2013, seen in [place], who presented to [facility] ED from home with complaints of left arm numbness and drooling. MRI revealed patchy small volume acute right MCA territory infarcts involving the right frontoparietal region and multiple remote lacunar infarcts about the bilateral basal ganglia and right cerebellum.   OT comments  Pt continues to present with decreased cognition. Administered Pill Box Test to assess executive functioning, problem solving, memory, error recognition, and safety with IADLs. Pt failed the assessment, demonstrating poor planning, mental flexibility, suboptimal search strategies, concrete thinking and the inability to multitask. Pt 's errors were more than 3 errors is considered a fail.         Errors:        One tablet 3x/day (yellow) - 30 errors (omission/misplacement)        One tablet 2x/day with breakfast and dinner (green) - Not placing in pill box        One tablet in the morning (Blue)- 14 errors (omission/misplacement)        One tablet daily at bedtime (orange) - Not placing in pill box        One tablet every other day (red) - Not placing in pill box          Total time to complete task (allowed 5 min) - 6 min and 55 sec  After failure of assessment, providing Max cues to review instructions and assist pt in completing task successfully. Pt requiring Max cues to problem solve instruction and then follow through. Pt demonstrating poor awareness of errors and deficits. When asking pt about performance of task, pt reporting it is easy.   Continue to recommend dc with follow up OT and 24/7 support to assist with IADLs. Will continue to follow acutely as admitted.    Follow Up Recommendations  Home health  OT;Supervision/Assistance - 24 hour (May progress to OP pending transportation)    Equipment Recommendations  Tub/shower seat    Recommendations for Other Services PT consult;Speech consult    Precautions / Restrictions Precautions Precautions: Fall       Mobility Bed Mobility Overal bed mobility: Needs Assistance Bed Mobility: Supine to Sit;Sit to Supine     Supine to sit: Supervision Sit to supine: Supervision   General bed mobility comments: Supervision for safety with increased time  Transfers   Equipment used: None                  Balance Overall balance assessment: Needs assistance Sitting-balance support: No upper extremity supported;Feet supported Sitting balance-Leahy Scale: Good     Standing balance support: No upper extremity supported;During functional activity Standing balance-Leahy Scale: Fair                             ADL either performed or assessed with clinical judgement   ADL Overall ADL's : Needs assistance/impaired                                       General ADL Comments: Administer Pill Box Test to assess executive functioning. Pt presenting with poor problem solving, memory, recognition of errors, and executive functioning.      Vision  Perception     Praxis      Cognition Arousal/Alertness: Awake/alert Behavior During Therapy: Flat affect;WFL for tasks assessed/performed Overall Cognitive Status: Impaired/Different from baseline Area of Impairment: Problem solving;Awareness;Following commands;Memory                     Memory: Decreased short-term memory Following Commands: Follows multi-step commands with increased time   Awareness: Emergent Problem Solving: Requires verbal cues;Slow processing General Comments: Administer the Pill Box Test to assess executive functioning and ADLs/IADLs. Pt reading each pill bottle. Pt placing pills from bottles "take one tablet in morning"  and "take 1 tablet three times daily" in the morning slot for each day. Pt unable to stating he is finished. Upon review pt unable to recognize error. Pt requiring Max directive cues to follow instructions on pill bottle and then problem solve how to organize them into pill box. Even with Max instructions, pt requiring cues throughout to correctly follow instructions.        Exercises     Shoulder Instructions       General Comments      Pertinent Vitals/ Pain       Pain Assessment: Faces Faces Pain Scale: No hurt Pain Intervention(s): Monitored during session  Home Living                                          Prior Functioning/Environment              Frequency  Min 2X/week        Progress Toward Goals  OT Goals(current goals can now be found in the care plan section)  Progress towards OT goals: Not progressing toward goals - comment (decreased cognition)  Acute Rehab OT Goals Patient Stated Goal: go home OT Goal Formulation: With patient Time For Goal Achievement: 08/20/20 Potential to Achieve Goals: Good ADL Goals Pt Will Transfer to Toilet: with modified independence;ambulating;regular height toilet Pt Will Perform Tub/Shower Transfer: Tub transfer;with modified independence;ambulating;shower seat Additional ADL Goal #1: Pt will follow three step command during ADL with Min cues Additional ADL Goal #2: Pt will perform four part trail making task with Min cues Additional ADL Goal #3: Pt will perform simple IADL with Min cues  Plan Discharge plan needs to be updated    Co-evaluation                 AM-PAC OT "6 Clicks" Daily Activity     Outcome Measure   Help from another person eating meals?: None Help from another person taking care of personal grooming?: A Little Help from another person toileting, which includes using toliet, bedpan, or urinal?: A Little Help from another person bathing (including washing, rinsing,  drying)?: A Little Help from another person to put on and taking off regular upper body clothing?: A Little Help from another person to put on and taking off regular lower body clothing?: A Little 6 Click Score: 19    End of Session    OT Visit Diagnosis: Unsteadiness on feet (R26.81);Other abnormalities of gait and mobility (R26.89);Muscle weakness (generalized) (M62.81);Other symptoms and signs involving cognitive function   Activity Tolerance Patient tolerated treatment well   Patient Left in bed;with call bell/phone within reach   Nurse Communication Mobility status        Time: 0109-3235 OT Time Calculation (min): 30 min  Charges: OT General  Charges $OT Visit: 1 Visit OT Treatments $Cognitive Funtion inital: Initial 15 mins $Cognitive Funtion additional: Additional15 mins  Jones Viviani MSOT, OTR/L Acute Rehab Pager: [phone] Office: [phone]   Theodoro Grist [place] Coldren 08/09/2020, 4:23 PM

## 2020-08-09 NOTE — Plan of Care (Signed)
Problem: Education: Goal: Knowledge of General Education information will improve Description: Including pain rating scale, medication(s)/side effects and non-pharmacologic comfort measures 08/09/2020 0152 by Valrie Hart, RN Outcome: Progressing 08/09/2020 0008 by Valrie Hart, RN Outcome: Progressing   Problem: Health Behavior/Discharge Planning: Goal: Ability to manage health-related needs will improve 08/09/2020 0152 by Valrie Hart, RN Outcome: Progressing 08/09/2020 0008 by Valrie Hart, RN Outcome: Progressing   Problem: Clinical Measurements: Goal: Ability to maintain clinical measurements within normal limits will improve 08/09/2020 0152 by Valrie Hart, RN Outcome: Progressing 08/09/2020 0008 by Valrie Hart, RN Outcome: Progressing Goal: Will remain free from infection 08/09/2020 0152 by Valrie Hart, RN Outcome: Progressing 08/09/2020 0008 by Valrie Hart, RN Outcome: Progressing Goal: Diagnostic test results will improve 08/09/2020 0152 by Valrie Hart, RN Outcome: Progressing 08/09/2020 0008 by Valrie Hart, RN Outcome: Progressing Goal: Respiratory complications will improve 08/09/2020 0152 by Valrie Hart, RN Outcome: Progressing 08/09/2020 0008 by Valrie Hart, RN Outcome: Progressing Goal: Cardiovascular complication will be avoided 08/09/2020 0152 by Valrie Hart, RN Outcome: Progressing 08/09/2020 0008 by Valrie Hart, RN Outcome: Progressing   Problem: Activity: Goal: Risk for activity intolerance will decrease 08/09/2020 0152 by Valrie Hart, RN Outcome: Progressing 08/09/2020 0008 by Valrie Hart, RN Outcome: Progressing   Problem: Nutrition: Goal: Adequate nutrition will be maintained 08/09/2020 0152 by Valrie Hart, RN Outcome: Progressing 08/09/2020 0008 by Valrie Hart, RN Outcome: Progressing   Problem: Coping: Goal: Level of anxiety will decrease 08/09/2020 0152 by Valrie Hart, RN Outcome:  Progressing 08/09/2020 0008 by Valrie Hart, RN Outcome: Progressing   Problem: Elimination: Goal: Will not experience complications related to bowel motility 08/09/2020 0152 by Valrie Hart, RN Outcome: Progressing 08/09/2020 0008 by Valrie Hart, RN Outcome: Progressing Goal: Will not experience complications related to urinary retention 08/09/2020 0152 by Valrie Hart, RN Outcome: Progressing 08/09/2020 0008 by Valrie Hart, RN Outcome: Progressing   Problem: Pain Managment: Goal: General experience of comfort will improve 08/09/2020 0152 by Valrie Hart, RN Outcome: Progressing 08/09/2020 0008 by Valrie Hart, RN Outcome: Progressing   Problem: Safety: Goal: Ability to remain free from injury will improve 08/09/2020 0152 by Valrie Hart, RN Outcome: Progressing 08/09/2020 0008 by Valrie Hart, RN Outcome: Progressing   Problem: Skin Integrity: Goal: Risk for impaired skin integrity will decrease 08/09/2020 0152 by Valrie Hart, RN Outcome: Progressing 08/09/2020 0008 by Valrie Hart, RN Outcome: Progressing   Problem: Self-Care: Goal: Ability to communicate needs accurately will improve 08/09/2020 0152 by Valrie Hart, RN Outcome: Progressing 08/09/2020 0008 by Valrie Hart, RN Outcome: Progressing   Problem: Nutrition: Goal: Risk of aspiration will decrease 08/09/2020 0152 by Valrie Hart, RN Outcome: Progressing 08/09/2020 0008 by Valrie Hart, RN Outcome: Progressing Goal: Dietary intake will improve 08/09/2020 0152 by Valrie Hart, RN Outcome: Progressing 08/09/2020 0008 by Valrie Hart, RN Outcome: Progressing   Problem: Ischemic Stroke/TIA Tissue Perfusion: Goal: Complications of ischemic stroke/TIA will be minimized 08/09/2020 0152 by Valrie Hart, RN Outcome: Progressing 08/09/2020 0008 by Valrie Hart, RN Outcome: Progressing   Problem: Education: Goal: Knowledge of disease or condition will improve Outcome:  Progressing Goal: Knowledge of secondary prevention will improve Outcome: Progressing Goal: Knowledge of patient specific risk factors addressed and post discharge goals established will improve Outcome: Progressing   Problem: Self-Care: Goal: Ability to participate in  self-care as condition permits will improve Outcome: Progressing   Problem: Nutrition: Goal: Risk of aspiration will decrease Outcome: Progressing Goal: Dietary intake will improve Outcome: Progressing   Problem: Ischemic Stroke/TIA Tissue Perfusion: Goal: Complications of ischemic stroke/TIA will be minimized Outcome: Progressing

## 2020-08-09 NOTE — Plan of Care (Signed)
  Problem: Education: Goal: Knowledge of General Education information will improve Description: Including pain rating scale, medication(s)/side effects and non-pharmacologic comfort measures Outcome: Progressing   Problem: Health Behavior/Discharge Planning: Goal: Ability to manage health-related needs will improve Outcome: Progressing   Problem: Clinical Measurements: Goal: Ability to maintain clinical measurements within normal limits will improve Outcome: Progressing Goal: Will remain free from infection Outcome: Progressing Goal: Diagnostic test results will improve Outcome: Progressing Goal: Respiratory complications will improve Outcome: Progressing Goal: Cardiovascular complication will be avoided Outcome: Progressing   Problem: Activity: Goal: Risk for activity intolerance will decrease Outcome: Progressing   Problem: Nutrition: Goal: Adequate nutrition will be maintained Outcome: Progressing   Problem: Coping: Goal: Level of anxiety will decrease Outcome: Progressing   Problem: Elimination: Goal: Will not experience complications related to bowel motility Outcome: Progressing Goal: Will not experience complications related to urinary retention Outcome: Progressing   Problem: Pain Managment: Goal: General experience of comfort will improve Outcome: Progressing   Problem: Safety: Goal: Ability to remain free from injury will improve Outcome: Progressing   Problem: Skin Integrity: Goal: Risk for impaired skin integrity will decrease Outcome: Progressing   Problem: Self-Care: Goal: Ability to communicate needs accurately will improve Outcome: Progressing   Problem: Nutrition: Goal: Risk of aspiration will decrease Outcome: Progressing Goal: Dietary intake will improve Outcome: Progressing   Problem: Ischemic Stroke/TIA Tissue Perfusion: Goal: Complications of ischemic stroke/TIA will be minimized Outcome: Progressing

## 2020-08-09 NOTE — Op Note (Signed)
TEE  Pt sedated by anesthesia with Propofol Throat anesthetized with viscous lidocaine Mouth guard placed  TEE probe advanced to mid esophagus without complication  LA, LAA without masses MV normal  Trivial MR TV normal Trivial TR AV is normal  Trivial AI PV nromal   Mild PI  No PFO as tested by color doppler or with injection of agitated saline   Thoracic aorta with minimal intimal thickening   LVEF and RVEF are normal  Procedure without complications.     Dietrich Pates MD

## 2020-08-09 NOTE — Anesthesia Procedure Notes (Signed)
Procedure Name: MAC Date/Time: 08/09/2020 10:45 AM Performed by: Kathryne Hitch, CRNA Pre-anesthesia Checklist: Patient identified, Emergency Drugs available, Suction available and Patient being monitored Patient Re-evaluated:Patient Re-evaluated prior to induction Oxygen Delivery Method: Nasal cannula Preoxygenation: Pre-oxygenation with 100% oxygen Induction Type: IV induction Placement Confirmation: positive ETCO2 Dental Injury: Teeth and Oropharynx as per pre-operative assessment

## 2020-08-09 NOTE — Interval H&P Note (Signed)
History and Physical Interval Note:  08/09/2020 10:30 AM  [facility]  has presented today for surgery, with the diagnosis of STROKE.  The various methods of treatment have been discussed with the patient and family. After consideration of risks, benefits and other options for treatment, the patient has consented to  Procedure(s): TRANSESOPHAGEAL ECHOCARDIOGRAM (TEE) (N/A) as a surgical intervention.  The patient's history has been reviewed, patient examined, no change in status, stable for surgery.  I have reviewed the patient's chart and labs.  Questions were answered to the patient's satisfaction.     Adam [name]

## 2020-08-09 NOTE — TOC Progression Note (Signed)
Transition of Care [facility]) - Progression Note    Patient Details  Name: Adam [name] MRN: [id number] Date of Birth: 25-Aug-1961  [facility] CM/SW Contact  Lorri Frederick, LCSW Phone Number: 08/09/2020, 1:29 PM  Clinical Narrative:   CSW spoke with sister regarding current issues with swallowing, which continue to delay discharge.  She was at work, did not have time to talk.  CSW provided her with phone numbers and information to set up medicaid transportation through [facility].    Expected Discharge Plan: Home/Self Care Barriers to Discharge: Continued Medical Work up  Expected Discharge Plan and Services Expected Discharge Plan: Home/Self Care     Post Acute Care Choice: NA Living arrangements for the past 2 months: Apartment                                       Social Determinants of Health (SDOH) Interventions    Readmission Risk Interventions No flowsheet data found.

## 2020-08-09 NOTE — Progress Notes (Signed)
Nutrition Follow-up  DOCUMENTATION CODES:   Severe malnutrition in context of social or environmental circumstances  INTERVENTION:   Monitor magnesium, potassium, and phosphorus daily for at least 3 days, MD to replete as needed, as pt is at risk for refeeding syndrome.   Once Cortrak is placed and ready for use: -Initiate Osmolite 1.2 cal $Remov'@25ml'OqtOQK$ /hr, advance 29ml/hr Q12H until goal rate of 65 is reached -10ml Prosource TF daily  At goal, tube feeding provides 1912 kcals, 97 grams protein, 1210ml free water  -Continue Vitamin B12 1045mcg po daily -Continue MVI daily   NUTRITION DIAGNOSIS:   Severe Malnutrition related to social / environmental circumstances as evidenced by severe muscle depletion, severe fat depletion, energy intake < or equal to 50% for > or equal to 1 month.  Ongoing  GOAL:   Patient will meet greater than or equal to 90% of their needs  Not met  MONITOR:   PO intake, Supplement acceptance, Weight trends, Labs, I & O's  REASON FOR ASSESSMENT:   Consult Assessment of nutrition requirement/status  ASSESSMENT:   Pt admitted for CVA. PMH includes HTN and prior CVA (2013).  SLP met with pt yesterday and noted that his dysphagia has progressively worsened since previous session. SLP also recommending pt have Cortrak placed today to aid in meeting kcal/protein goals. Discussed with MD.   No height or weight on file. Pt reports he is 6' tall and that he previously weighed 149 lbs. Pt thinks this weight was at least 3 months ago and reports that he has lost weight since then. Took 2 weights using bed scale -- 117 lbs and 116 lbs were the results. Will use this to estimate needs for tube feeding.    No additional meal documentation since last RD assessment.   Labs reviewed. Medications: Ensure Enlive TID, MVI, Thiamine, Vitamin B12 IVF: NS @ 65ml/hr  Diet Order:   Diet Order    None      EDUCATION NEEDS:   Education needs have been  addressed  Skin:  Skin Assessment: Reviewed RN Assessment  Last BM:  9/16  Height:   Ht Readings from Last 1 Encounters:  No data found for Ht    Weight:   Wt Readings from Last 1 Encounters:  No data found for Wt   BMI:  There is no height or weight on file to calculate BMI.  Estimated Nutritional Needs:   Kcal:  1800-2000  Protein:  90-105 grams  Fluid:  >/=1.8L/d    Adam Ina, MS, RD, LDN RD pager number and weekend/on-call pager number located in [place].

## 2020-08-09 NOTE — Progress Notes (Signed)
OT Cancellation Note  Patient Details Name: Linkin Vizzini MRN: [id number] DOB: 1961-02-29   Cancelled Treatment:    Reason Eval/Treat Not Completed: Patient at procedure or test/ unavailable (ENDO. Will return as schedule allows.)  Loraine Freid M Breniyah Romm Jahn Franchini MSOT, OTR/L Acute Rehab Pager: [phone] Office: [phone] 08/09/2020, 11:03 AM

## 2020-08-09 NOTE — Anesthesia Preprocedure Evaluation (Addendum)
Anesthesia Evaluation  Patient identified by MRN, date of birth, ID band Patient awake    Reviewed: Allergy & Precautions, NPO status , Patient's Chart, lab work & pertinent test results  Airway Mallampati: I  TM Distance: >3 FB Neck ROM: Full    Dental  (+) Poor Dentition, Teeth Intact, Dental Advisory Given   Pulmonary Current Smoker,    Pulmonary exam normal breath sounds clear to auscultation       Cardiovascular hypertension, Pt. on medications Normal cardiovascular exam Rhythm:Regular Rate:Normal  Echo 08/04/20: 1. Left ventricular ejection fraction, by estimation, is 60 to 65%. The left ventricle has normal function. The left ventricle has no regional wall motion abnormalities. Left ventricular diastolic parameters were normal.  2. Right ventricular systolic function is normal. The right ventricular size is normal.  3. The mitral valve is normal in structure. Trivial mitral valve regurgitation. No evidence of mitral stenosis.  4. The aortic valve is normal in structure. Aortic valve regurgitation is not visualized. No aortic stenosis is present.  5. The inferior vena cava is normal in size with greater than 50% respiratory variability, suggesting right atrial pressure of 70mHg.  6. Agitated saline contrast bubble study was negative, with no evidence of any interatrial shunt.    Neuro/Psych Hx prior stroke (residual right-sided weakness and dysarthria) who presented on 08/03/2020 with acute onset drooling, left arm/torso sensory changes, headache, bifacial weakness and worsening speech and gait and was found to have acute right MCA territory infarcts  Significant dysphagia and hoarseness, mostly answers with head nodding  CVA, Residual Symptoms negative psych ROS   GI/Hepatic negative GI ROS, Neg liver ROS,   Endo/Other  negative endocrine ROS  Renal/GU Renal InsufficiencyRenal diseaseCr 1.31  negative genitourinary    Musculoskeletal negative musculoskeletal ROS (+)   Abdominal   Peds  Hematology negative hematology ROS (+)   Anesthesia Other Findings   Reproductive/Obstetrics negative OB ROS                            Anesthesia Physical Anesthesia Plan  ASA: III  Anesthesia Plan: MAC   Post-op Pain Management:    Induction:   PONV Risk Score and Plan: 2 and Propofol infusion and TIVA  Airway Management Planned: Natural Airway and Simple Face Mask  Additional Equipment: None  Intra-op Plan:   Post-operative Plan:   Informed Consent: I have reviewed the patients History and Physical, chart, labs and discussed the procedure including the risks, benefits and alternatives for the proposed anesthesia with the patient or authorized representative who has indicated his/her understanding and acceptance.       Plan Discussed with: CRNA  Anesthesia Plan Comments:        Anesthesia Quick Evaluation

## 2020-08-09 NOTE — Anesthesia Postprocedure Evaluation (Signed)
Anesthesia Post Note  Patient: [name]  Procedure(s) Performed: TRANSESOPHAGEAL ECHOCARDIOGRAM (TEE) (N/A ) BUBBLE STUDY     Patient location during evaluation: PACU Anesthesia Type: MAC Level of consciousness: awake and alert Pain management: pain level controlled Vital Signs Assessment: post-procedure vital signs reviewed and stable Respiratory status: spontaneous breathing, nonlabored ventilation and respiratory function stable Cardiovascular status: blood pressure returned to baseline and stable Postop Assessment: no apparent nausea or vomiting Anesthetic complications: no   No complications documented.  Last Vitals:  Vitals:   08/09/20 1120 08/09/20 1130  BP: 116/79 (!) 145/97  Pulse: 96 93  Resp: (!) 26 17  Temp:    SpO2: 100% 99%    Last Pain:  Vitals:   08/09/20 1130  TempSrc:   PainSc: 0-No pain                 Pervis Hocking

## 2020-08-10 LAB — PHOSPHORUS
Phosphorus: 3 mg/dL (ref 2.5–4.6)
Phosphorus: 2.8 mg/dL (ref 2.5–4.6)

## 2020-08-10 LAB — MAGNESIUM
Magnesium: 2.4 mg/dL (ref 1.7–2.4)
Magnesium: 2.5 mg/dL — ABNORMAL HIGH (ref 1.7–2.4)

## 2020-08-10 LAB — GLUCOSE, CAPILLARY
Glucose-Capillary: 111 mg/dL — ABNORMAL HIGH (ref 70–99)
Glucose-Capillary: 117 mg/dL — ABNORMAL HIGH (ref 70–99)
Glucose-Capillary: 131 mg/dL — ABNORMAL HIGH (ref 70–99)
Glucose-Capillary: 147 mg/dL — ABNORMAL HIGH (ref 70–99)
Glucose-Capillary: 90 mg/dL (ref 70–99)
Glucose-Capillary: 97 mg/dL (ref 70–99)

## 2020-08-10 LAB — BASIC METABOLIC PANEL
Anion gap: 12 (ref 5–15)
BUN: 48 mg/dL — ABNORMAL HIGH (ref 6–20)
CO2: 23 mmol/L (ref 22–32)
Calcium: 9.5 mg/dL (ref 8.9–10.3)
Chloride: 106 mmol/L (ref 98–111)
Creatinine, Ser: 1.06 mg/dL (ref 0.61–1.24)
GFR calc Af Amer: >60 mL/min (ref >60)
GFR calc non Af Amer: >60 mL/min (ref >60)
Glucose, Bld: 98 mg/dL (ref 70–99)
Potassium: 3.5 mmol/L (ref 3.5–5.1)
Sodium: 141 mmol/L (ref 135–145)

## 2020-08-10 LAB — C-REACTIVE PROTEIN: CRP: 1.4 mg/dL — ABNORMAL HIGH (ref <1.0)

## 2020-08-10 MED ORDER — VITAMIN B-12 1000 MCG PO TABS
1000.0000 ug | ORAL_TABLET | Freq: Every day | ORAL | Status: DC
  Administered 2020-08-11 – 2020-08-21 (×11): 1000 ug
  Filled 2020-08-10 (×11): qty 1

## 2020-08-10 MED ORDER — TRAMADOL HCL 50 MG PO TABS
50.0000 mg | ORAL_TABLET | Freq: Four times a day (QID) | ORAL | Status: DC | PRN
  Administered 2020-08-10 – 2020-08-21 (×12): 50 mg via NASOGASTRIC
  Filled 2020-08-10 (×12): qty 1

## 2020-08-10 MED ORDER — AMLODIPINE BESYLATE 5 MG PO TABS
5.0000 mg | ORAL_TABLET | Freq: Every day | ORAL | Status: DC
  Administered 2020-08-11 – 2020-08-17 (×7): 5 mg
  Filled 2020-08-10 (×7): qty 1

## 2020-08-10 MED ORDER — THIAMINE HCL 100 MG PO TABS
100.0000 mg | ORAL_TABLET | Freq: Every day | ORAL | Status: DC
  Administered 2020-08-11 – 2020-08-21 (×11): 100 mg
  Filled 2020-08-10 (×12): qty 1

## 2020-08-10 MED ORDER — ATORVASTATIN CALCIUM 80 MG PO TABS
80.0000 mg | ORAL_TABLET | Freq: Every day | ORAL | Status: DC
  Administered 2020-08-11 – 2020-08-21 (×11): 80 mg
  Filled 2020-08-10 (×11): qty 1

## 2020-08-10 MED ORDER — ADULT MULTIVITAMIN LIQUID CH
15.0000 mL | Freq: Every day | ORAL | Status: DC
  Administered 2020-08-11 – 2020-08-21 (×11): 15 mL
  Filled 2020-08-10 (×11): qty 15

## 2020-08-10 MED ORDER — LISINOPRIL 10 MG PO TABS
10.0000 mg | ORAL_TABLET | Freq: Every day | ORAL | Status: DC
  Administered 2020-08-11 – 2020-08-17 (×7): 10 mg
  Filled 2020-08-10 (×7): qty 1

## 2020-08-10 MED ORDER — CLOPIDOGREL BISULFATE 75 MG PO TABS
75.0000 mg | ORAL_TABLET | Freq: Every day | ORAL | Status: DC
  Administered 2020-08-11: 75 mg
  Filled 2020-08-10: qty 1

## 2020-08-10 MED ORDER — HYDROCHLOROTHIAZIDE 25 MG PO TABS
25.0000 mg | ORAL_TABLET | Freq: Every day | ORAL | Status: DC
  Administered 2020-08-11 – 2020-08-17 (×7): 25 mg
  Filled 2020-08-10 (×7): qty 1

## 2020-08-10 NOTE — Progress Notes (Signed)
STROKE TEAM PROGRESS NOTE    INTERVAL HISTORY No family at the bedside. Pt awake alert, still has drooling and dysarthria, hypophonia with cortrak. Moving all extremities well. On TF. BP fluctuating, on 3 BP meds.   OBJECTIVE Vitals:   08/09/20 1120 08/09/20 1130 08/09/20 2311 08/10/20 0805  BP: 116/79 (!) 145/97 (!) 158/98 (!) 117/93  Pulse: 96 93 86 87  Resp: (!) 26 17 20 16   Temp:   (!) 97.5 F (36.4 C) 98 F (36.7 C)  TempSrc:   Oral   SpO2: 100% 99% 99%     CBC:  No results for input(s): WBC, NEUTROABS, HGB, HCT, MCV, PLT in the last 168 hours.  Basic Metabolic Panel:  Recent Labs  Lab 08/09/20 0101 08/09/20 1428 08/09/20 1652 08/10/20 0443  NA 140  --   --  141  K 3.8  --   --  3.5  CL 106  --   --  106  CO2 22  --   --  23  GLUCOSE 92  --   --  98  BUN 52*  --   --  48*  CREATININE 1.31*  --   --  1.06  CALCIUM 9.9  --   --  9.5  MG  --    < > 2.3 2.4  PHOS  --    < > 3.2 3.0   < > = values in this interval not displayed.    Lipid Panel:     Component Value Date/Time   CHOL 123 08/04/2020 1142   TRIG 49 08/04/2020 1142   HDL 33 (L) 08/04/2020 1142   CHOLHDL 3.7 08/04/2020 1142   VLDL 10 08/04/2020 1142   LDLCALC 80 08/04/2020 1142   HgbA1c:  Lab Results  Component Value Date   HGBA1C 5.7 (H) 08/06/2020   Urine Drug Screen: No results found for: LABOPIA, COCAINSCRNUR, LABBENZ, AMPHETMU, THCU, LABBARB  Alcohol Level     Component Value Date/Time   ETH <10 08/03/2020 2354    IMAGING  CT HEAD CT Angio Head W or Wo Contrast CT ANGIO NECK W OR WO CONTRAST 08/03/2020 IMPRESSION:   CT head:  1. Subcentimeter age-indeterminate infarct within the right cerebellum.  2. Multiple chronic appearing lacunar infarcts within the bilateral basal ganglia.  3. Background mild generalized parenchymal atrophy with advanced chronic small vessel ischemic disease.   CTA neck:  The common carotid, internal carotid and vertebral arteries are patent within the  neck without hemodynamically significant stenosis.   CTA head:  1. No definite proximal right M2 branch occlusion is identified. However, there is a paucity of visualized proximal M2 superior division right MCA branch vessels. If there is concern for an acute right MCA territory stroke, consider catheter based angiography for further evaluation.  2. Intracranial atherosclerotic disease with multifocal stenoses, most notably as follows.  3. Moderate stenosis within the distal M1 right middle cerebral artery.  4. High-grade focal stenosis within the distal A2/proximal A3 left anterior cerebral artery.  5. High-grade focal stenosis within the P2 left PCA.  6. High-grade stenoses within the P4 right PCA.   MR Brain W and Wo Contrast 08/04/2020 IMPRESSION:  1. Patchy small volume acute right MCA territory infarcts involving the right frontoparietal region as above. No associated hemorrhage or mass effect.  2. Underlying advanced cerebral atrophy and chronic microvascular ischemic disease for age, with multiple remote lacunar infarcts about the bilateral basal ganglia and right cerebellum.   Transthoracic Echocardiogram  Normal ejection  fraction 60 to 65%.  No cardiac source of embolism  ECG - SR rate 82 BPM. (See cardiology reading for complete details)  PHYSICAL EXAM  Temp:  [97.5 F (36.4 C)-98 F (36.7 C)] 98 F (36.7 C) (09/18 0805) Pulse Rate:  [86-87] 87 (09/18 0805) Resp:  [16-20] 16 (09/18 0805) BP: (117-158)/(93-98) 117/93 (09/18 0805) SpO2:  [99 %] 99 % (09/17 2311)  General - Well nourished, well developed, in no apparent distress.  Ophthalmologic - fundi not visualized due to noncooperation.  Cardiovascular - Regular rhythm and rate.  Mental Status -  Level of arousal and orientation to time, place, and person were intact. Language including expression, naming, repetition, comprehension was assessed and found intact. However, hypophonia, and moderate dysarthria  Fund of  Knowledge was assessed and was intact.  Cranial Nerves II - XII - II - Visual field intact OU. III, IV, VI - Extraocular movements intact. V - Facial sensation intact bilaterally. VII - bilateral lower facial weakness, right more than left. VIII - Hearing & vestibular intact bilaterally. X - Palate elevation not able to see. Moderate dysarthria with hypophonia XI - Chin turning & shoulder shrug intact bilaterally. XII - Tongue protrusion incomplete.  Motor Strength - The patient's strength was normal in all extremities and pronator drift was absent.  Bulk was normal and fasciculations were absent.   Motor Tone - Muscle tone was assessed at the neck and appendages and was normal.  Reflexes - The patient's reflexes were symmetrical in all extremities and he had no pathological reflexes.  Sensory - Light touch, temperature/pinprick were assessed and were symmetrical.    Coordination - The patient had normal movements in the hands with no ataxia or dysmetria.  Tremor was absent.  Gait and Station - deferred.   ASSESSMENT/PLAN Adam [name] is a 59 y.o. male with history of hypertension, prior stroke (residual right-sided weakness and dysarthria), active smoking (reports 1 cigarette/day) presenting with acute onset drooling, patchy left arm and torso sensory changes, headache, cough, bifacial weakness, significantly worsened speech and gait with recent weight loss. He did not receive IV t-PA due to late presentation (>4.5 hours from time of onset).   Stroke: acute right MCA patchy infarcts, embolic vs. Large vessel disease   CT head - age-indeterminate infarct within the right cerebellum. Multiple chronic appearing lacunar infarcts within the bilateral basal ganglia.   MRI head - Patchy small volume acute right MCA territory infarcts involving the right frontoparietal region as above. multiple remote lacunar infarcts at bilateral basal ganglia and right cerebellum.   CTA Head and  neck- right M1/M2, left A2/A3, left P2 and right P4 stenosis.  2D Echo - EF 60 to 65%.    Sars Corona Virus 2 - negative  LDL - 80  HgbA1c - 5.7   UDS - pending  Hypercoagulable labs pending  VTE prophylaxis - Lovenox  No antithrombotic prior to admission, now on aspirin 81 mg daily and Plavix 75 DAPT.  Patient counseled to be compliant with his antithrombotic medications  Ongoing aggressive stroke risk factor management  Therapy recommendations:  HH OT  Disposition:  Pending  Dysphagia  Did not pass a swallow  Significant drooling and hypophonia  Has core track placed  On tube feeding  Speech on board  Hypertension  Home BP meds: none  Current BP meds: Amlodipine 5, lisinopril 10, HCTZ 25  Stable  Avoid low BP . Long-term BP goal normotensive  Hyperlipidemia  Home Lipid lowering medication: none  LDL 80, goal < 70  Current lipid lowering medication: Lipitor 80 mg daily  Continue statin at discharge  B12 deficiency  B12 level 90  On B12 supplement  Homocystine level pending  Tobacco abuse  Current smoker  Smoking cessation counseling provided  Pt is willing to quit  Other Stroke Risk Factors  Previous ETOH use  Previous substance abuse - UDS pending  Other Active Problems  Code status - Full code  AKI creatinine 1.27->1.31->1.06  Mild bradycardia [facility] day # 6  Marvel Plan, MD PhD Stroke Neurology 08/10/2020 5:10 PM    To contact Stroke Continuity provider, please refer to [email or web address]. After hours, contact General Neurology

## 2020-08-10 NOTE — Progress Notes (Signed)
[facility]  PROGRESS NOTE  Adam [name] EVO:[id number] DOB: Jan 04, 1961 DOA: 08/03/2020 PCP: Patient, [name] Admit date - 08/03/2020   Admitting Physician Darlin Drop, DO  Outpatient Primary MD for the patient is Patient, [name]  LOS - 6 Brief Narrative   Adam [name] is a 59 y.o. year old male with medical history significant for HTN, prior stroke (residual right-sided weakness and dysarthria) who presented on 08/03/2020 with acute onset drooling, left arm/torso sensory changes, headache, bifacial weakness and worsening speech and gait and was found to have acute right MCA territory infarcts.CTA of head showed high-grade focal distal stenosis within the distal A2/proximal A2 left anterior cerebral artery with high-grade focal stenosis in the P2 left PCA high-grade stenosis within the P4 right PCA.  CTA neck showed no significant stenosis.  TTE showed preserved EF.  LDL 80, A1c 5.7  Hospital course complicated by persistent drooling and decreased p.o. intake  Subjective  Today has little bit discomfort from the core track tube otherwise no acute complaints.  Still is agreeable to getting placement of feeding tube if not able to eat food by mouth A & P  Acute right MCA territory infarcts, embolic event unclear source currently.  TEE with preserved EF, no masses, no PFO -Does have some left-sided weakness but able to mobilize well and does not currently need CIR, was able to discharge home we will set up for home health PT -continue aspirin, Plavix x3 weeks followed by aspirin alone per neurology -continue high intensity level statin   Increased drooling and difficulty swallowing, unclear if this is residual deficit of recent stroke/apraxia or pseudobulbar state related to dysphagia.  Currently having difficulty tolerating p.o. due to significant inability to control oral secretions despite diet change to liquid -Continue intensive speech therapy while inpatient -Cortrak tube  placed on 9/17,  tube feeds, nutrition consulted -Hopeful this will improve nutrition as we continue to monitor to see if any improving swallow/oral secretions,  suspect patient may warrant  permanent nutrition alternative i.e. feeding tube if no improvement early next week  B12 deficiency.  The setting of severe calorie protein malnutrition B12 90 -continue B12, thiamine, multivitamin supplementation  Hypertension, not at goal but improving.  SBP's in the 140s and DBPs in 90s . TSH within normal limits That -Started on lisinopril 10 mg on 9/15, will hold off on increasing given creatinine currently 1.3(prior baseline 0.9-1.1) -Continue HCTZ 25 mg -Started amlodipine 5 mg on 9/16   AKI, mild Baseline creatinine 0.9-1.  Currently 1. 3could be related to the recently started lisinopril , but remains stable. Could also related to decreased oral intake related to persistent drooling/inability to tolerate oral secretions -Monitor BMP -Avoid nephrotoxins  Hypokalemia, mild.  Patient is on HCTZ.  Mag within normal limits.  K3.5 -Replete orally as allowed -Monitor BMP  Severe malnutrition.  Has fair muscle depletion on examination -Given persistent drooling and difficulty swallowing even a liquid diet starting tube feeds via cor track  -Appreciate speech and nutrition consultations     Family Communication  : Updated sister on 9/15  Code Status : Full  Disposition Plan  :  Patient is from home. Anticipated d/c date:  1 to 2 days. Barriers to d/c or necessity for inpatient status:  Starting tube feeds with core track, to assist in nutrition while assessing ability to improve oral intake related to persistent drooling inability to tolerate oral secretions/dysphagia very likely patient will need placement of more permanent feeding  tube either by IR or EGS. consults  : Neurology  Procedures  : TTE, 9/16, TEE 9/17 core track feeding tube placed on 9/17  DVT Prophylaxis  :  Lovenox   Lab  Results  Component Value Date   PLT 207 08/03/2020    Diet :  Diet Order    None       Inpatient Medications Scheduled Meds: . amLODipine  5 mg Oral Daily  . aspirin EC  81 mg Oral Daily   Or  . aspirin  300 mg Rectal Daily  . atorvastatin  80 mg Oral Daily  . clopidogrel  75 mg Oral Daily  . enoxaparin (LOVENOX) injection  40 mg Subcutaneous Daily  . feeding supplement (PROSource TF)  45 mL Per Tube Daily  . hydrochlorothiazide  25 mg Oral Daily  . lisinopril  10 mg Oral Daily  . multivitamins with iron  1 tablet Oral Daily  . thiamine  100 mg Oral Daily  . vitamin B-12  1,000 mcg Oral Daily   Continuous Infusions: . feeding supplement (OSMOLITE 1.2 CAL) 1,000 mL (08/09/20 1615)   PRN Meds:.hydrALAZINE, Resource ThickenUp Clear  Antibiotics  :   Anti-infectives (From admission, onward)   None       Objective   Vitals:   08/09/20 1120 08/09/20 1130 08/09/20 2311 08/10/20 0805  BP: 116/79 (!) 145/97 (!) 158/98 (!) 117/93  Pulse: 96 93 86 87  Resp: (!) Temp:   (!) 97.5 F (36.4 C) 98 F (36.7 C)  TempSrc:   Oral   SpO2: 100% 99% 99%     SpO2: 99 % O2 Flow Rate (L/min): 2 L/min  Wt Readings from Last 3 Encounters:  No data found for Wt     Intake/Output Summary (Last 24 hours) at 08/10/2020 1126 Last data filed at 08/09/2020 1615 Gross per 24 hour  Intake 25 ml  Output --  Net 25 ml    Physical Exam:     Awake Alert, Oriented X 3, Very thin/emaciated, frail gentleman Difficulty speaking given persistent drooling No new F.N deficits,  [place].AT, Core track tube in place Normal respiratory effort on room air, CTAB RRR,No Gallops,Rubs or new Murmurs,  +ve B.Sounds, Abd Soft, No tenderness, No rebound, guarding or rigidity. No Cyanosis, No new Rash or bruise   I have personally reviewed the following:   Data Reviewed:  CBC Recent Labs  Lab 08/03/20 1427 08/03/20 1438  WBC 5.2  --   HGB 13.6 13.9  HCT 41.0 41.0  PLT 207   --   MCV 74.5*  --   MCH 24.7*  --   MCHC 33.2  --   RDW 15.5  --   LYMPHSABS 1.7  --   MONOABS 0.4  --   EOSABS 0.0  --   BASOSABS 0.0  --     Chemistries  Recent Labs  Lab 08/03/20 1427 08/03/20 1427 08/03/20 1438 08/07/20 1101 08/08/20 0135 08/08/20 0927 08/09/20 0101 08/09/20 1428 08/09/20 1652 08/10/20 0443  NA 142   < > 145 144 141  --  140  --   --  141  K 3.6   < > 3.7 3.4* 3.3*  --  3.8  --   --  3.5  CL 107   < > 109 107 104  --  106  --   --  106  CO2 21*  --   --  24 23  --  22  --   --  23  GLUCOSE 87   < > 82 105* 93  --  92  --   --  98  BUN 29*   < > 30* 39* 45*  --  52*  --   --  48*  CREATININE 1.10   < > 0.90 1.30* 1.27*  --  1.31*  --   --  1.06  CALCIUM 9.7  --   --  10.4* 9.8  --  9.9  --   --  9.5  MG  --   --   --   --   --  2.3  --  2.3 2.3 2.4  AST 16  --   --   --   --   --   --   --   --   --   ALT 11  --   --   --   --   --   --   --   --   --   ALKPHOS 76  --   --   --   --   --   --   --   --   --   BILITOT 0.8  --   --   --   --   --   --   --   --   --    < > = values in this interval not displayed.   ------------------------------------------------------------------------------------------------------------------ No results for input(s): CHOL, HDL, LDLCALC, TRIG, CHOLHDL, LDLDIRECT in the last 72 hours.  Lab Results  Component Value Date   HGBA1C 5.7 (H) 08/06/2020   ------------------------------------------------------------------------------------------------------------------ No results for input(s): TSH, T4TOTAL, T3FREE, THYROIDAB in the last 72 hours.  Invalid input(s): FREET3 ------------------------------------------------------------------------------------------------------------------ No results for input(s): VITAMINB12, FOLATE, FERRITIN, TIBC, IRON, RETICCTPCT in the last 72 hours.  Coagulation profile Recent Labs  Lab 08/03/20 1427  INR 1.1    No results for input(s): DDIMER in the last 72 hours.  Cardiac  Enzymes No results for input(s): CKMB, TROPONINI, MYOGLOBIN in the last 168 hours.  Invalid input(s): CK ------------------------------------------------------------------------------------------------------------------ No results found for: BNP  Micro Results Recent Results (from the past 240 hour(s))  SARS Coronavirus 2 by RT PCR (hospital order, [facility]) Nasopharyngeal Nasopharyngeal Swab     Status: None   Collection Time: 08/04/20  3:22 AM   Specimen: Nasopharyngeal Swab  Result Value Ref Range Status   SARS Coronavirus 2 NEGATIVE NEGATIVE Final    Comment: (NOTE) SARS-CoV-2 target nucleic acids are NOT DETECTED.  The SARS-CoV-2 RNA is generally detectable in upper and lower respiratory specimens during the acute phase of infection. The lowest concentration of SARS-CoV-2 viral copies this assay can detect is 250 copies / mL. A negative result does not preclude SARS-CoV-2 infection and should not be used as the sole basis for treatment or other patient management decisions.  A negative result may occur with improper specimen collection / handling, submission of specimen other than nasopharyngeal swab, presence of viral mutation(s) within the areas targeted by this assay, and inadequate number of viral copies (<250 copies / mL). A negative result must be combined with clinical observations, patient history, and epidemiological information.  Fact Sheet for Patients:   BoilerBrush.com.cyhttps://www.fda.gov/media/136312/download  Fact Sheet for Healthcare Providers: https://pope.com/https://www.fda.gov/media/136313/download  This test is not yet approved or  cleared by the [place] FDA and has been authorized for detection and/or diagnosis of SARS-CoV-2 by FDA under an Emergency Use Authorization (EUA).  This EUA will remain in effect (  meaning this test can be used) for the duration of the COVID-19 declaration under Section 564(b)(1) of the Act, 21 U.S.C. section  360bbb-3(b)(1), unless the authorization is terminated or revoked sooner.  Performed at [facility], [address], [place], [place] [address]     Radiology Reports CT Angio Head W or [place] Contrast  Result Date: 08/03/2020 CLINICAL DATA:  Neuro deficit, acute, stroke suspected. Additional history provided: Numbness yesterday. Drooling. EXAM: CT ANGIOGRAPHY HEAD AND NECK TECHNIQUE: Multidetector CT imaging of the head and neck was performed using the standard protocol during bolus administration of intravenous contrast. Multiplanar CT image reconstructions and MIPs were obtained to evaluate the vascular anatomy. Carotid stenosis measurements (when applicable) are obtained utilizing NASCET criteria, using the distal internal carotid diameter as the denominator. CONTRAST:  75mL OMNIPAQUE IOHEXOL 350 MG/ML SOLN COMPARISON:  No pertinent prior exams are available for comparison. FINDINGS: CT HEAD FINDINGS Brain: Mild generalized parenchymal atrophy. There are multiple chronic appearing lacunar infarcts within the basal ganglia bilaterally. Background advanced ill-defined hypoattenuation within the cerebral white matter is nonspecific, but consistent with chronic small vessel ischemic disease. Subcentimeter age-indeterminate infarct within right cerebellar hemisphere (series 5, image 13). There is no acute intracranial hemorrhage. No demarcated cortical infarct is identified. No extra-axial fluid collection. No evidence of intracranial mass. No midline shift. Vascular: Reported below. Skull: Normal. Negative for fracture or focal lesion. Sinuses: No significant paranasal sinus disease or mastoid effusion at the imaged levels. Orbits: No acute finding. Chronic fracture deformity of the right lamina papyracea. Review of the MIP images confirms the above findings CTA NECK FINDINGS Aortic arch: Common origin of the innominate and left common carotid arteries. The visualized aortic arch is unremarkable. No  hemodynamically significant innominate or proximal subclavian artery stenosis. Right carotid system: CCA and ICA patent within the neck without stenosis. Left carotid system: CCA and ICA patent within the neck without stenosis. Vertebral arteries: Codominant and patent within the neck without stenosis Skeleton: No acute bony abnormality or aggressive osseous lesion. Cervical spondylosis with multilevel disc space narrowing, disc bulges, uncovertebral and facet hypertrophy. Other neck: No neck mass or cervical lymphadenopathy. Thyroid unremarkable. Upper chest: No consolidation within the imaged lung apices. Review of the MIP images confirms the above findings CTA HEAD FINDINGS Anterior circulation: The intracranial internal carotid arteries are patent. The M1 middle cerebral arteries are patent. There is moderate stenosis within the distal M1 right middle cerebral artery. No definite right M2 proximal branch occlusion is identified. However, there is a paucity of visualized proximal superior division right M2 MCA branches. The right anterior cerebral artery is patent without significant proximal stenosis. There is a high-grade focal stenosis within the distal A2/proximal A3 left anterior cerebral artery. No intracranial aneurysm is identified. Posterior circulation: The intracranial vertebral arteries are patent. The basilar artery is patent. The posterior cerebral arteries are patent. There is a high-grade focal stenosis within the P2 left PCA. Additionally, there are multifocal high-grade stenoses within the P4 right PCA. Posterior communicating arteries are present bilaterally. Venous sinuses: Within limitations of contrast timing, no convincing thrombus. Anatomic variants: None significant. Review of the MIP images confirms the above findings These results were called by telephone at the time of interpretation on 08/03/2020 at 8:28 pm to provider [facility] , who verbally acknowledged these results.  IMPRESSION: CT head: 1. Subcentimeter age-indeterminate infarct within the right cerebellum. 2. Multiple chronic appearing lacunar infarcts within the bilateral basal ganglia. 3. Background mild generalized parenchymal atrophy with  advanced chronic small vessel ischemic disease. CTA neck: The common carotid, internal carotid and vertebral arteries are patent within the neck without hemodynamically significant stenosis. CTA head: 1. No definite proximal right M2 branch occlusion is identified. However, there is a paucity of visualized proximal M2 superior division right MCA branch vessels. If there is concern for an acute right MCA territory stroke, consider catheter based angiography for further evaluation. 2. Intracranial atherosclerotic disease with multifocal stenoses, most notably as follows. 3. Moderate stenosis within the distal M1 right middle cerebral artery. 4. High-grade focal stenosis within the distal A2/proximal A3 left anterior cerebral artery. 5. High-grade focal stenosis within the P2 left PCA. 6. High-grade stenoses within the P4 right PCA. Electronically Signed   By: Jackey Loge DO   On: 08/03/2020 20:29   CT ANGIO NECK W OR WO CONTRAST  Result Date: 08/03/2020 CLINICAL DATA:  Neuro deficit, acute, stroke suspected. Additional history provided: Numbness yesterday. Drooling. EXAM: CT ANGIOGRAPHY HEAD AND NECK TECHNIQUE: Multidetector CT imaging of the head and neck was performed using the standard protocol during bolus administration of intravenous contrast. Multiplanar CT image reconstructions and MIPs were obtained to evaluate the vascular anatomy. Carotid stenosis measurements (when applicable) are obtained utilizing NASCET criteria, using the distal internal carotid diameter as the denominator. CONTRAST:  75mL OMNIPAQUE IOHEXOL 350 MG/ML SOLN COMPARISON:  No pertinent prior exams are available for comparison. FINDINGS: CT HEAD FINDINGS Brain: Mild generalized parenchymal atrophy. There are  multiple chronic appearing lacunar infarcts within the basal ganglia bilaterally. Background advanced ill-defined hypoattenuation within the cerebral white matter is nonspecific, but consistent with chronic small vessel ischemic disease. Subcentimeter age-indeterminate infarct within right cerebellar hemisphere (series 5, image 13). There is no acute intracranial hemorrhage. No demarcated cortical infarct is identified. No extra-axial fluid collection. No evidence of intracranial mass. No midline shift. Vascular: Reported below. Skull: Normal. Negative for fracture or focal lesion. Sinuses: No significant paranasal sinus disease or mastoid effusion at the imaged levels. Orbits: No acute finding. Chronic fracture deformity of the right lamina papyracea. Review of the MIP images confirms the above findings CTA NECK FINDINGS Aortic arch: Common origin of the innominate and left common carotid arteries. The visualized aortic arch is unremarkable. No hemodynamically significant innominate or proximal subclavian artery stenosis. Right carotid system: CCA and ICA patent within the neck without stenosis. Left carotid system: CCA and ICA patent within the neck without stenosis. Vertebral arteries: Codominant and patent within the neck without stenosis Skeleton: No acute bony abnormality or aggressive osseous lesion. Cervical spondylosis with multilevel disc space narrowing, disc bulges, uncovertebral and facet hypertrophy. Other neck: No neck mass or cervical lymphadenopathy. Thyroid unremarkable. Upper chest: No consolidation within the imaged lung apices. Review of the MIP images confirms the above findings CTA HEAD FINDINGS Anterior circulation: The intracranial internal carotid arteries are patent. The M1 middle cerebral arteries are patent. There is moderate stenosis within the distal M1 right middle cerebral artery. No definite right M2 proximal branch occlusion is identified. However, there is a paucity of visualized  proximal superior division right M2 MCA branches. The right anterior cerebral artery is patent without significant proximal stenosis. There is a high-grade focal stenosis within the distal A2/proximal A3 left anterior cerebral artery. No intracranial aneurysm is identified. Posterior circulation: The intracranial vertebral arteries are patent. The basilar artery is patent. The posterior cerebral arteries are patent. There is a high-grade focal stenosis within the P2 left PCA. Additionally, there are multifocal high-grade stenoses within the P4  right PCA. Posterior communicating arteries are present bilaterally. Venous sinuses: Within limitations of contrast timing, no convincing thrombus. Anatomic variants: None significant. Review of the MIP images confirms the above findings These results were called by telephone at the time of interpretation on 08/03/2020 at 8:28 pm to provider [facility] , who verbally acknowledged these results. IMPRESSION: CT head: 1. Subcentimeter age-indeterminate infarct within the right cerebellum. 2. Multiple chronic appearing lacunar infarcts within the bilateral basal ganglia. 3. Background mild generalized parenchymal atrophy with advanced chronic small vessel ischemic disease. CTA neck: The common carotid, internal carotid and vertebral arteries are patent within the neck without hemodynamically significant stenosis. CTA head: 1. No definite proximal right M2 branch occlusion is identified. However, there is a paucity of visualized proximal M2 superior division right MCA branch vessels. If there is concern for an acute right MCA territory stroke, consider catheter based angiography for further evaluation. 2. Intracranial atherosclerotic disease with multifocal stenoses, most notably as follows. 3. Moderate stenosis within the distal M1 right middle cerebral artery. 4. High-grade focal stenosis within the distal A2/proximal A3 left anterior cerebral artery. 5. High-grade focal  stenosis within the P2 left PCA. 6. High-grade stenoses within the P4 right PCA. Electronically Signed   By: Jackey Loge DO   On: 08/03/2020 20:29   MR Brain W and [place] Contrast  Result Date: 08/04/2020 CLINICAL DATA:  Initial evaluation for neuro deficit, stroke suspected. EXAM: MRI HEAD WITHOUT AND WITH CONTRAST TECHNIQUE: Multiplanar, multiecho pulse sequences of the brain and surrounding structures were obtained without and with intravenous contrast. CONTRAST:  15mL GADAVIST GADOBUTROL 1 MMOL/ML IV SOLN COMPARISON:  Comparison made with prior CTA from 08/03/2020. FINDINGS: Brain: Advanced cerebral atrophy for age. Patchy and confluent T2/FLAIR hyperintensity within the periventricular deep white matter both cerebral hemispheres, most consistent with chronic microvascular ischemic disease, advanced for age. Multiple scattered remote lacunar infarcts present about the bilateral basal ganglia. Associated chronic hemosiderin staining noted about several of these infarcts. Additionally, there are multiple small remote right cerebellar infarcts. Patchy small volume restricted diffusion seen involving the cortical and subcortical right frontal parietal region, consistent with acute right MCA territory infarcts. Involvement of the pre and postcentral gyri noted. Largest area of infarction seen at the postcentral gyrus and measures up to 1.6 cm in diameter. No associated hemorrhage or mass effect. Gray-white matter differentiation otherwise maintained. No acute intracranial hemorrhage elsewhere within the brain. No mass lesion, midline shift or mass effect. Ventricles normal size without hydrocephalus. No extra-axial fluid collection. Pituitary gland and suprasellar region within normal limits. No visible parenchymal signal abnormality seen about the medulla or brainstem with thin section imaging. No abnormal enhancement. Vascular: Major intracranial vascular flow voids are maintained. Skull and upper cervical spine:  Craniocervical junction within normal limits. Bone marrow signal intensity within normal limits. No focal marrow replacing lesion. No scalp soft tissue abnormality. Sinuses/Orbits: Globes and orbital soft tissues demonstrate no acute finding. Mild scattered mucosal thickening noted within the sphenoid ethmoidal sinuses. No mastoid effusion. Inner ear structures grossly normal. Other: None. IMPRESSION: 1. Patchy small volume acute right MCA territory infarcts involving the right frontoparietal region as above. No associated hemorrhage or mass effect. 2. Underlying advanced cerebral atrophy and chronic microvascular ischemic disease for age, with multiple remote lacunar infarcts about the bilateral basal ganglia and right cerebellum. Electronically Signed   By: Rise Mu M.D.   On: 08/04/2020 02:18   DG Swallowing Func-Speech Pathology  Result Date: 08/05/2020 Objective Swallowing Evaluation: Type  of Study: MBS-Modified Barium Swallow Study  Patient Details Name: Balthazar Dooly MRN: [id number] Date of Birth: 02-18-1961 Today's Date: 08/05/2020 Time: SLP Start Time (ACUTE ONLY): 2585 -SLP Stop Time (ACUTE ONLY): 0902 SLP Time Calculation (min) (ACUTE ONLY): 23 min Past Medical History: No past medical history on file. Past Surgical History: No past surgical history on file. HPI: Patient is a 59 y.o. 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 Per chart review, his sister had reported she noticed bifacial weakness, significant worsened speech and significantly worsened gait. MRI brain revealed patchy small volume acute right MCA territory infarcts involving right frontoparietal region but no associated hemorrhage or mass effect, as well as underlying advanced cerebral atrophy and chronic microvascular ischemic disease, multiple remote lacunar infarcts about the basal  ganglia and right cerebellum.  Assessment / Plan / Recommendation CHL IP CLINICAL IMPRESSIONS 08/05/2020 Clinical Impression Pt presents with oropharyngeal dysphagia characterized by reduced labial seal, impaired bolus propulsion, impaired bolus cohesion, reduced lingual retraction, reduced anterior laryngeal movement, and a pharyngeal delay. He demonstrated anterior spillage, oral holding, lingual pumping, piecemeal deglutition, premature spillage, vallecular residue, pyriform sinus residue, penetration (PAS 3) of thin liquids and silent aspiration (PAS 8) of nectar thick liquids secondary to the pharyngeal delay. Pt's most significant impairment is oral holding with it sometimes being over a minute in length. Pt required verbal and tactile cues to initiate a swallow and anterior spillage was still noted intermittently when pt attempted to swallow.  Pt's swallow was most timely with with puree solids and honey thick liquids via cup. Amount of pharyngeal residue increased with bolus sizes. 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 Pt is safest with his current diet of dysphagia 1 (puree) solids and honey thick liquids. However, considering the level of support required for swallowing of limited boluses during the study, it is anticipated that may not be able to maintain adequate nutrition or hydration p.o. and that supplemental nonoral nutrition may be needed in the future. SLP will follow for dysphagia treatment.  SLP Visit Diagnosis Dysphagia, oropharyngeal phase (R13.12) Attention and concentration deficit following -- Frontal lobe and executive function deficit following -- Impact on safety and function Mild aspiration risk;Moderate aspiration risk   [facility] IP TREATMENT RECOMMENDATION 08/05/2020 Treatment Recommendations Therapy as outlined in treatment plan below   Prognosis 08/05/2020 Prognosis for Safe Diet Advancement Good Barriers to Reach Goals Cognitive  deficits;Severity of deficits Barriers/Prognosis Comment -- [facility] IP DIET RECOMMENDATION 08/05/2020 SLP Diet Recommendations Honey thick liquids;Dysphagia 1 (Puree) solids Liquid Administration via Cup;Straw Medication Administration Crushed with puree Compensations Slow rate;Small sips/bites;Minimize environmental distractions;Follow solids with liquid;Other (Comment) Postural Changes Remain semi-upright after after feeds/meals (Comment)   [facility] IP OTHER RECOMMENDATIONS 08/05/2020 Recommended Consults -- Oral Care Recommendations Oral care BID Other Recommendations --   [facility] IP FOLLOW UP RECOMMENDATIONS 08/05/2020 Follow up Recommendations Inpatient Rehab   [facility] IP FREQUENCY AND DURATION 08/05/2020 Speech Therapy Frequency (ACUTE ONLY) min 2x/week Treatment Duration 2 weeks      [facility] IP ORAL PHASE 08/05/2020 Oral Phase Impaired Oral - Pudding Teaspoon -- Oral - Pudding Cup -- Oral - Honey Teaspoon -- Oral - Honey Cup Reduced posterior propulsion;Holding of bolus;Piecemeal swallowing;Right anterior bolus loss;Left anterior bolus loss;Lingual pumping;Delayed oral transit;Decreased bolus cohesion;Premature spillage Oral - Nectar Teaspoon -- Oral - Nectar Cup Reduced posterior propulsion;Holding of bolus;Piecemeal swallowing;Right anterior  bolus loss;Left anterior bolus loss;Lingual pumping;Delayed oral transit;Decreased bolus cohesion;Premature spillage Oral - Nectar Straw Reduced posterior propulsion;Holding of bolus;Piecemeal swallowing;Right anterior bolus loss;Left anterior bolus loss;Lingual pumping;Delayed oral transit;Decreased bolus cohesion;Premature spillage Oral - Thin Teaspoon -- Oral - Thin Cup Reduced posterior propulsion;Holding of bolus;Piecemeal swallowing;Right anterior bolus loss;Left anterior bolus loss;Lingual pumping;Delayed oral transit;Decreased bolus cohesion;Premature spillage Oral - Thin Straw Reduced posterior propulsion;Holding of bolus;Piecemeal swallowing;Right anterior bolus loss;Left anterior  bolus loss;Lingual pumping;Delayed oral transit;Decreased bolus cohesion;Premature spillage Oral - Puree Reduced posterior propulsion;Holding of bolus;Piecemeal swallowing;Right anterior bolus loss;Left anterior bolus loss;Lingual pumping;Delayed oral transit;Decreased bolus cohesion;Premature spillage Oral - Mech Soft -- Oral - Regular -- Oral - Multi-Consistency -- Oral - Pill -- Oral Phase - Comment --  [facility] IP PHARYNGEAL PHASE 08/05/2020 Pharyngeal Phase Impaired Pharyngeal- Pudding Teaspoon -- Pharyngeal -- Pharyngeal- Pudding Cup -- Pharyngeal -- Pharyngeal- Honey Teaspoon Reduced anterior laryngeal mobility;Delayed swallow initiation-vallecula;Delayed swallow initiation-pyriform sinuses;Pharyngeal residue - valleculae;Pharyngeal residue - pyriform;Reduced tongue base retraction Pharyngeal -- Pharyngeal- Honey Cup -- Pharyngeal -- Pharyngeal- Nectar Teaspoon -- Pharyngeal -- Pharyngeal- Nectar Cup Reduced anterior laryngeal mobility;Delayed swallow initiation-vallecula;Delayed swallow initiation-pyriform sinuses;Pharyngeal residue - valleculae;Pharyngeal residue - pyriform;Reduced tongue base retraction;Penetration/Aspiration before swallow Pharyngeal Material enters airway, passes BELOW cords without attempt by patient to eject out (silent aspiration) Pharyngeal- Nectar Straw Reduced anterior laryngeal mobility;Delayed swallow initiation-vallecula;Delayed swallow initiation-pyriform sinuses;Pharyngeal residue - valleculae;Pharyngeal residue - pyriform;Reduced tongue base retraction Pharyngeal -- Pharyngeal- Thin Teaspoon -- Pharyngeal -- Pharyngeal- Thin Cup Reduced anterior laryngeal mobility;Delayed swallow initiation-vallecula;Delayed swallow initiation-pyriform sinuses;Pharyngeal residue - valleculae;Pharyngeal residue - pyriform;Reduced tongue base retraction;Penetration/Aspiration during swallow Pharyngeal Material enters airway, remains ABOVE vocal cords and not ejected out Pharyngeal- Thin Straw Reduced  anterior laryngeal mobility;Delayed swallow initiation-vallecula;Delayed swallow initiation-pyriform sinuses;Pharyngeal residue - valleculae;Pharyngeal residue - pyriform;Reduced tongue base retraction Pharyngeal -- Pharyngeal- Puree Reduced anterior laryngeal mobility;Delayed swallow initiation-vallecula;Delayed swallow initiation-pyriform sinuses;Pharyngeal residue - valleculae;Pharyngeal residue - pyriform;Reduced tongue base retraction Pharyngeal -- Pharyngeal- Mechanical Soft -- Pharyngeal -- Pharyngeal- Regular -- Pharyngeal -- Pharyngeal- Multi-consistency -- Pharyngeal -- Pharyngeal- Pill -- Pharyngeal -- Pharyngeal Comment --  No flowsheet data found. Shanika I. Vear Clock, MS, CCC-SLP Acute Rehabilitation Services Office number [phone] Pager [phone] Scheryl Marten 08/05/2020, 10:11 AM              VAS [place] TRANSCRANIAL DOPPLER W BUBBLES  Result Date: 08/06/2020  Transcranial Doppler with Bubble Indications: Stroke. Comparison Study: No prior study Performing Technologist: Gertie Fey MHA, RDMS, RVT, RDCS  Examination Guidelines: A complete evaluation includes B-mode imaging, spectral Doppler, color Doppler, and power Doppler as needed of all accessible portions of each vessel. Bilateral testing is considered an integral part of a complete examination. Limited examinations for reoccurring indications may be performed as noted.  Summary: No HITS at rest or during Valsalva. Negative transcranial Doppler Bubble study with no evidence of right to left intracardiac communication.  A vascular evaluation was performed. The right middle cerebral artery was studied. An IV was inserted into the patient's right forearm. Verbal informed consent was obtained.  Negative TCD Bubble study *See table(s) above for TCD measurements and observations.  Diagnosing physician: Delia Heady MD Electronically signed by Delia Heady MD on 08/06/2020 at 12:32:56 PM.    Final    ECHO TEE  Result Date: 08/09/2020     TRANSESOPHOGEAL ECHO REPORT   Patient Name:   JAFARI MCKILLOP Date of Exam: 08/09/2020 Medical Rec #:  [id number]      Height: Accession #:    [id number]  Weight: Date of Birth:  1961/05/04      BSA: Patient Age:    58 years       BP:           129/96 mmHg Patient Gender: M              HR:           93 bpm. Exam Location:  Inpatient Procedure: Transesophageal Echo, Color Doppler and Saline Contrast Bubble Study Indications:     Stroke  History:         Patient has prior history of Echocardiogram examinations, most                  recent 08/04/2020. Risk Factors:Hypertension and Current Smoker.  Sonographer:     Renella Cunas RDCS Referring Phys:  [id number] Manson Passey Diagnosing Phys: Dietrich Pates MD PROCEDURE: The transesophogeal probe was passed without difficulty through the esophogus of the patient. Sedation performed by different physician. The patient was monitored while under deep sedation. Anesthestetic sedation was provided intravenously by Anesthesiology: 134.75mg  of Propofol, 40mg  of Lidocaine. The patient developed no complications during the procedure. IMPRESSIONS  1. Left ventricular ejection fraction, by estimation, is 55 to 60%. The left ventricle has normal function.  2. Right ventricular systolic function is normal. The right ventricular size is normal.  3. No left atrial/left atrial appendage thrombus was detected.  4. The mitral valve is normal in structure. Trivial mitral valve regurgitation.  5. The aortic valve is normal in structure. Aortic valve regurgitation is trivial.  6. Minimal plaquing of the thoracic aorta (fixed). FINDINGS  Left Ventricle: Left ventricular ejection fraction, by estimation, is 55 to 60%. The left ventricle has normal function. The left ventricular internal cavity size was normal in size. Right Ventricle: The right ventricular size is normal. No increase in right ventricular wall thickness. Right ventricular systolic function is normal. Left Atrium: Left atrial size  was normal in size. No left atrial/left atrial appendage thrombus was detected. Right Atrium: Right atrial size was normal in size. Pericardium: There is no evidence of pericardial effusion. Mitral Valve: The mitral valve is normal in structure. Trivial mitral valve regurgitation. Tricuspid Valve: The tricuspid valve is normal in structure. Tricuspid valve regurgitation is trivial. Aortic Valve: The aortic valve is normal in structure. Aortic valve regurgitation is trivial. Pulmonic Valve: The pulmonic valve was normal in structure. Pulmonic valve regurgitation is mild. Aorta: Minimal plaquing of the thoracic aorta (fixed). The aortic root is normal in size and structure. IAS/Shunts: No atrial level shunt detected by color flow Doppler. Agitated saline contrast was given intravenously to evaluate for intracardiac shunting. Dietrich Pates MD Electronically signed by Dietrich Pates MD Signature Date/Time: 08/09/2020/5:52:07 PM    Final    ECHOCARDIOGRAM COMPLETE BUBBLE STUDY  Result Date: 08/04/2020    ECHOCARDIOGRAM REPORT   Patient Name:   DEAUNTE DENTE Date of Exam: 08/04/2020 Medical Rec #:  [id number]      Height: Accession #:    [id number]     Weight: Date of Birth:  02/17/1961      BSA: Patient Age:    58 years       BP:           171/70 mmHg Patient Gender: M              HR:           55 bpm. Exam Location:  Inpatient Procedure: 2D Echo Indications:    Stroke  434.91 / I63.9  History:        Patient has no prior history of Echocardiogram examinations.                 Stroke; Risk Factors:Hypertension and Current Smoker.  Sonographer:    Jeryl Columbia Referring Phys: [id number] CAROLE N HALL IMPRESSIONS  1. Left ventricular ejection fraction, by estimation, is 60 to 65%. The left ventricle has normal function. The left ventricle has no regional wall motion abnormalities. Left ventricular diastolic parameters were normal.  2. Right ventricular systolic function is normal. The right ventricular size is normal.  3. The  mitral valve is normal in structure. Trivial mitral valve regurgitation. No evidence of mitral stenosis.  4. The aortic valve is normal in structure. Aortic valve regurgitation is not visualized. No aortic stenosis is present.  5. The inferior vena cava is normal in size with greater than 50% respiratory variability, suggesting right atrial pressure of 3 mmHg.  6. Agitated saline contrast bubble study was negative, with no evidence of any interatrial shunt. FINDINGS  Left Ventricle: Left ventricular ejection fraction, by estimation, is 60 to 65%. The left ventricle has normal function. The left ventricle has no regional wall motion abnormalities. The left ventricular internal cavity size was normal in size. There is  no left ventricular hypertrophy. Left ventricular diastolic parameters were normal. Right Ventricle: The right ventricular size is normal. No increase in right ventricular wall thickness. Right ventricular systolic function is normal. Left Atrium: Left atrial size was normal in size. Right Atrium: Right atrial size was normal in size. Pericardium: There is no evidence of pericardial effusion. Mitral Valve: The mitral valve is normal in structure. There is mild thickening of the mitral valve leaflet(s). Trivial mitral valve regurgitation. No evidence of mitral valve stenosis. Tricuspid Valve: The tricuspid valve is normal in structure. Tricuspid valve regurgitation is not demonstrated. No evidence of tricuspid stenosis. Aortic Valve: The aortic valve is normal in structure. Aortic valve regurgitation is not visualized. No aortic stenosis is present. Pulmonic Valve: The pulmonic valve was normal in structure. Pulmonic valve regurgitation is not visualized. No evidence of pulmonic stenosis. Aorta: The aortic root is normal in size and structure. Venous: The inferior vena cava is normal in size with greater than 50% respiratory variability, suggesting right atrial pressure of 3 mmHg. IAS/Shunts: No atrial  level shunt detected by color flow Doppler. Agitated saline contrast was given intravenously to evaluate for intracardiac shunting. Agitated saline contrast bubble study was negative, with no evidence of any interatrial shunt. Charlton Haws MD Electronically signed by Charlton Haws MD Signature Date/Time: 08/04/2020/3:38:04 PM    Final      Time Spent in minutes > 30     Laverna Peace M.D on 08/10/2020 at 11:26 AM  To page go to [email or web address] - password [facility]

## 2020-08-11 DIAGNOSIS — R1310 Dysphagia, unspecified: Secondary | ICD-10-CM

## 2020-08-11 LAB — GLUCOSE, CAPILLARY
Glucose-Capillary: 116 mg/dL — ABNORMAL HIGH (ref 70–99)
Glucose-Capillary: 139 mg/dL — ABNORMAL HIGH (ref 70–99)
Glucose-Capillary: 106 mg/dL — ABNORMAL HIGH (ref 70–99)
Glucose-Capillary: 143 mg/dL — ABNORMAL HIGH (ref 70–99)
Glucose-Capillary: 123 mg/dL — ABNORMAL HIGH (ref 70–99)

## 2020-08-11 LAB — RAPID URINE DRUG SCREEN, HOSP PERFORMED
Amphetamines: NOT DETECTED
Barbiturates: NOT DETECTED
Benzodiazepines: NOT DETECTED
Cocaine: NOT DETECTED
Opiates: NOT DETECTED
Tetrahydrocannabinol: POSITIVE — AB

## 2020-08-11 NOTE — Progress Notes (Signed)
[facility]  PROGRESS NOTE  Adam [name] NGE:[id number] DOB: [birth year] DOA: 08/03/2020 PCP: Patient, [name] Admit date - 08/03/2020   Admitting Physician Darlin Drop, DO  Outpatient Primary MD for the patient is Patient, [name]  LOS - 7 Brief Narrative   Adam [name] is a 59 y.o. year old male with medical history significant for HTN, prior stroke (residual right-sided weakness and dysarthria) who presented on 08/03/2020 with acute onset drooling, left arm/torso sensory changes, headache, bifacial weakness and worsening speech and gait and was found to have acute right MCA territory infarcts.CTA of head showed high-grade focal distal stenosis within the distal A2/proximal A2 left anterior cerebral artery with high-grade focal stenosis in the P2 left PCA high-grade stenosis within the P4 right PCA.  CTA neck showed no significant stenosis.  TTE showed preserved EF.  LDL 80, A1c 5.7  Hospital course complicated by persistent drooling and decreased p.o. intake  Subjective  Has no acute complaints today.  Would like to get feeding tube A & P  Acute right MCA territory infarcts, embolic event unclear source currently.  TEE with preserved EF, no masses, no PFO -Does have some left-sided weakness but able to mobilize well and does not currently need CIR, was able to discharge home we will set up for home health PT -continue aspirin, Plavix x3 weeks followed by aspirin alone per neurology -continue high intensity level statin   Increased drooling and dysphagia/hypophonia, unclear if this is residual deficit of recent stroke/apraxia or pseudobulbar state related to dysphagia.  Currently n.p.o. receiving nutrition through core track -Patient agreeable for IR placed feeding tube (discussed with neurology on 9/17) currently  -Continue intensive speech therapy while inpatient -Currently n.p.o.  B12 deficiency.  The setting of severe calorie protein malnutrition B12 90 -continue  B12, thiamine, multivitamin supplementation  Hypertension, at goal. TSH within normal limits -Started on lisinopril 10 mg on 9/15, -Continue HCTZ 25 mg -Started amlodipine 5 mg on 9/16   AKI, resolved Baseline creatinine 0.9-1.  Peak creatinine 1.3, now back to baseline.  Like related to diminished oral intake.  Seems to be tolerating low-dose lisinopril well -Monitor BMP -Avoid nephrotoxins  Hypokalemia, mild, resolved.  Patient is on HCTZ.  Mag within normal limits.  K3.5 -Monitor BMP  Severe malnutrition.  Has fair muscle depletion on examination -Given persistent drooling and difficulty swallowing even a liquid diet now on tube feeds via cor track  -Appreciate speech and nutrition consultations     Family Communication  : Updated sister on 9/15  Code Status : Full  Disposition Plan  :  Patient is from home. Anticipated d/c date:  1 to 2 days. Barriers to d/c or necessity for inpatient status:  Starting tube feeds with core track, to assist in nutrition, no improvement in ability to swallow or maintain oral secretions, patient will need placement of more permanent feeding tube, consult placed for IR, currently n.p.o. consults  : Neurology  Procedures  : TTE, 9/16, TEE 9/17 core track feeding tube placed on 9/17  DVT Prophylaxis  :  Lovenox   Lab Results  Component Value Date   PLT 207 08/03/2020    Diet :  Diet Order            Diet NPO time specified  Diet effective now                  Inpatient Medications Scheduled Meds: . amLODipine  5 mg Per Tube Daily  .  aspirin EC  81 mg Oral Daily   Or  . aspirin  300 mg Rectal Daily  . atorvastatin  80 mg Per Tube Daily  . clopidogrel  75 mg Per Tube Daily  . enoxaparin (LOVENOX) injection  40 mg Subcutaneous Daily  . feeding supplement (PROSource TF)  45 mL Per Tube Daily  . hydrochlorothiazide  25 mg Per Tube Daily  . lisinopril  10 mg Per Tube Daily  . multivitamin  15 mL Per Tube Daily  . thiamine  100 mg  Per Tube Daily  . vitamin B-12  1,000 mcg Per Tube Daily   Continuous Infusions: . feeding supplement (OSMOLITE 1.2 CAL) 1,000 mL (08/09/20 1615)   PRN Meds:.hydrALAZINE, Resource ThickenUp Clear, traMADol  Antibiotics  :   Anti-infectives (From admission, onward)   None       Objective   Vitals:   08/10/20 0805 08/10/20 1619 08/10/20 2156 08/11/20 0818  BP: (!) 117/93 (!) 119/94 (!) 132/107 (!) 122/97  Pulse: 87 85 87 (!) 120  Resp: Temp: 98 F (36.7 C) 97.9 F (36.6 C) 97.6 F (36.4 C) 98.3 F (36.8 C)  TempSrc:   Axillary   SpO2:  98% 100% 99%    SpO2: 99 % O2 Flow Rate (L/min): 2 L/min  Wt Readings from Last 3 Encounters:  No data found for Wt     Intake/Output Summary (Last 24 hours) at 08/11/2020 1446 Last data filed at 08/11/2020 1100 Gross per 24 hour  Intake 1418.75 ml  Output 200 ml  Net 1218.75 ml    Physical Exam:     Awake Alert, Oriented X 3, Very thin/emaciated, frail gentleman Difficulty speaking given persistent drooling No new F.N deficits,  [place].AT, Core track tube in place Normal respiratory effort on room air, CTAB RRR,No Gallops,Rubs or new Murmurs,  +ve B.Sounds, Abd Soft, No tenderness, No rebound, guarding or rigidity. No Cyanosis, No new Rash or bruise   I have personally reviewed the following:   Data Reviewed:  CBC No results for input(s): WBC, HGB, HCT, PLT, MCV, MCH, MCHC, RDW, LYMPHSABS, MONOABS, EOSABS, BASOSABS, BANDABS in the last 168 hours.  Invalid input(s): NEUTRABS, BANDSABD  Chemistries  Recent Labs  Lab 08/07/20 1101 08/08/20 0135 08/08/20 0927 08/09/20 0101 08/09/20 1428 08/09/20 1652 08/10/20 0443 08/10/20 1614  NA 144 141  --  140  --   --  141  --   K 3.4* 3.3*  --  3.8  --   --  3.5  --   CL 107 104  --  106  --   --  106  --   CO2 24 23  --  22  --   --  23  --   GLUCOSE 105* 93  --  92  --   --  98  --   BUN 39* 45*  --  52*  --   --  48*  --   CREATININE 1.30* 1.27*  --   1.31*  --   --  1.06  --   CALCIUM 10.4* 9.8  --  9.9  --   --  9.5  --   MG  --   --  2.3  --  2.3 2.3 2.4 2.5*   ------------------------------------------------------------------------------------------------------------------ No results for input(s): CHOL, HDL, LDLCALC, TRIG, CHOLHDL, LDLDIRECT in the last 72 hours.  Lab Results  Component Value Date   HGBA1C 5.7 (H) 08/06/2020   ------------------------------------------------------------------------------------------------------------------ No results for input(s):  TSH, T4TOTAL, T3FREE, THYROIDAB in the last 72 hours.  Invalid input(s): FREET3 ------------------------------------------------------------------------------------------------------------------ No results for input(s): VITAMINB12, FOLATE, FERRITIN, TIBC, IRON, RETICCTPCT in the last 72 hours.  Coagulation profile No results for input(s): INR, PROTIME in the last 168 hours.  No results for input(s): DDIMER in the last 72 hours.  Cardiac Enzymes No results for input(s): CKMB, TROPONINI, MYOGLOBIN in the last 168 hours.  Invalid input(s): CK ------------------------------------------------------------------------------------------------------------------ No results found for: BNP  Micro Results Recent Results (from the past 240 hour(s))  SARS Coronavirus 2 by RT PCR (hospital order, performed in [facility] hospital lab) Nasopharyngeal Nasopharyngeal Swab     Status: None   Collection Time: 08/04/20  3:22 AM   Specimen: Nasopharyngeal Swab  Result Value Ref Range Status   SARS Coronavirus 2 NEGATIVE NEGATIVE Final    Comment: (NOTE) SARS-CoV-2 target nucleic acids are NOT DETECTED.  The SARS-CoV-2 RNA is generally detectable in upper and lower respiratory specimens during the acute phase of infection. The lowest concentration of SARS-CoV-2 viral copies this assay can detect is 250 copies / mL. A negative result does not preclude SARS-CoV-2 infection and  should not be used as the sole basis for treatment or other patient management decisions.  A negative result may occur with improper specimen collection / handling, submission of specimen other than nasopharyngeal swab, presence of viral mutation(s) within the areas targeted by this assay, and inadequate number of viral copies (<250 copies / mL). A negative result must be combined with clinical observations, patient history, and epidemiological information.  Fact Sheet for Patients:   BoilerBrush.com.cy  Fact Sheet for Healthcare Providers: https://pope.com/  This test is not yet approved or  cleared by the [place] FDA and has been authorized for detection and/or diagnosis of SARS-CoV-2 by FDA under an Emergency Use Authorization (EUA).  This EUA will remain in effect (meaning this test can be used) for the duration of the COVID-19 declaration under Section 564(b)(1) of the Act, 21 U.S.C. section 360bbb-3(b)(1), unless the authorization is terminated or revoked sooner.  Performed at [facility], [address] [place], [place] [address]     Radiology Reports CT Angio Head W or [place] Contrast  Result Date: 08/03/2020 CLINICAL DATA:  Neuro deficit, acute, stroke suspected. Additional history provided: Numbness yesterday. Drooling. EXAM: CT ANGIOGRAPHY HEAD AND NECK TECHNIQUE: Multidetector CT imaging of the head and neck was performed using the standard protocol during bolus administration of intravenous contrast. Multiplanar CT image reconstructions and MIPs were obtained to evaluate the vascular anatomy. Carotid stenosis measurements (when applicable) are obtained utilizing NASCET criteria, using the distal internal carotid diameter as the denominator. CONTRAST:  70mL OMNIPAQUE IOHEXOL 350 MG/ML SOLN COMPARISON:  No pertinent prior exams are available for comparison. FINDINGS: CT HEAD FINDINGS Brain: Mild generalized parenchymal  atrophy. There are multiple chronic appearing lacunar infarcts within the basal ganglia bilaterally. Background advanced ill-defined hypoattenuation within the cerebral white matter is nonspecific, but consistent with chronic small vessel ischemic disease. Subcentimeter age-indeterminate infarct within right cerebellar hemisphere (series 5, image 13). There is no acute intracranial hemorrhage. No demarcated cortical infarct is identified. No extra-axial fluid collection. No evidence of intracranial mass. No midline shift. Vascular: Reported below. Skull: Normal. Negative for fracture or focal lesion. Sinuses: No significant paranasal sinus disease or mastoid effusion at the imaged levels. Orbits: No acute finding. Chronic fracture deformity of the right lamina papyracea. Review of the MIP images confirms the above findings CTA NECK FINDINGS Aortic arch:  Common origin of the innominate and left common carotid arteries. The visualized aortic arch is unremarkable. No hemodynamically significant innominate or proximal subclavian artery stenosis. Right carotid system: CCA and ICA patent within the neck without stenosis. Left carotid system: CCA and ICA patent within the neck without stenosis. Vertebral arteries: Codominant and patent within the neck without stenosis Skeleton: No acute bony abnormality or aggressive osseous lesion. Cervical spondylosis with multilevel disc space narrowing, disc bulges, uncovertebral and facet hypertrophy. Other neck: No neck mass or cervical lymphadenopathy. Thyroid unremarkable. Upper chest: No consolidation within the imaged lung apices. Review of the MIP images confirms the above findings CTA HEAD FINDINGS Anterior circulation: The intracranial internal carotid arteries are patent. The M1 middle cerebral arteries are patent. There is moderate stenosis within the distal M1 right middle cerebral artery. No definite right M2 proximal branch occlusion is identified. However, there is a  paucity of visualized proximal superior division right M2 MCA branches. The right anterior cerebral artery is patent without significant proximal stenosis. There is a high-grade focal stenosis within the distal A2/proximal A3 left anterior cerebral artery. No intracranial aneurysm is identified. Posterior circulation: The intracranial vertebral arteries are patent. The basilar artery is patent. The posterior cerebral arteries are patent. There is a high-grade focal stenosis within the P2 left PCA. Additionally, there are multifocal high-grade stenoses within the P4 right PCA. Posterior communicating arteries are present bilaterally. Venous sinuses: Within limitations of contrast timing, no convincing thrombus. Anatomic variants: None significant. Review of the MIP images confirms the above findings These results were called by telephone at the time of interpretation on 08/03/2020 at 8:28 pm to provider [facility] , who verbally acknowledged these results. IMPRESSION: CT head: 1. Subcentimeter age-indeterminate infarct within the right cerebellum. 2. Multiple chronic appearing lacunar infarcts within the bilateral basal ganglia. 3. Background mild generalized parenchymal atrophy with advanced chronic small vessel ischemic disease. CTA neck: The common carotid, internal carotid and vertebral arteries are patent within the neck without hemodynamically significant stenosis. CTA head: 1. No definite proximal right M2 branch occlusion is identified. However, there is a paucity of visualized proximal M2 superior division right MCA branch vessels. If there is concern for an acute right MCA territory stroke, consider catheter based angiography for further evaluation. 2. Intracranial atherosclerotic disease with multifocal stenoses, most notably as follows. 3. Moderate stenosis within the distal M1 right middle cerebral artery. 4. High-grade focal stenosis within the distal A2/proximal A3 left anterior cerebral artery. 5.  High-grade focal stenosis within the P2 left PCA. 6. High-grade stenoses within the P4 right PCA. Electronically Signed   By: Jackey Loge DO   On: 08/03/2020 20:29   CT ANGIO NECK W OR WO CONTRAST  Result Date: 08/03/2020 CLINICAL DATA:  Neuro deficit, acute, stroke suspected. Additional history provided: Numbness yesterday. Drooling. EXAM: CT ANGIOGRAPHY HEAD AND NECK TECHNIQUE: Multidetector CT imaging of the head and neck was performed using the standard protocol during bolus administration of intravenous contrast. Multiplanar CT image reconstructions and MIPs were obtained to evaluate the vascular anatomy. Carotid stenosis measurements (when applicable) are obtained utilizing NASCET criteria, using the distal internal carotid diameter as the denominator. CONTRAST:  36mL OMNIPAQUE IOHEXOL 350 MG/ML SOLN COMPARISON:  No pertinent prior exams are available for comparison. FINDINGS: CT HEAD FINDINGS Brain: Mild generalized parenchymal atrophy. There are multiple chronic appearing lacunar infarcts within the basal ganglia bilaterally. Background advanced ill-defined hypoattenuation within the cerebral white matter is nonspecific, but consistent with chronic small vessel ischemic  disease. Subcentimeter age-indeterminate infarct within right cerebellar hemisphere (series 5, image 13). There is no acute intracranial hemorrhage. No demarcated cortical infarct is identified. No extra-axial fluid collection. No evidence of intracranial mass. No midline shift. Vascular: Reported below. Skull: Normal. Negative for fracture or focal lesion. Sinuses: No significant paranasal sinus disease or mastoid effusion at the imaged levels. Orbits: No acute finding. Chronic fracture deformity of the right lamina papyracea. Review of the MIP images confirms the above findings CTA NECK FINDINGS Aortic arch: Common origin of the innominate and left common carotid arteries. The visualized aortic arch is unremarkable. No hemodynamically  significant innominate or proximal subclavian artery stenosis. Right carotid system: CCA and ICA patent within the neck without stenosis. Left carotid system: CCA and ICA patent within the neck without stenosis. Vertebral arteries: Codominant and patent within the neck without stenosis Skeleton: No acute bony abnormality or aggressive osseous lesion. Cervical spondylosis with multilevel disc space narrowing, disc bulges, uncovertebral and facet hypertrophy. Other neck: No neck mass or cervical lymphadenopathy. Thyroid unremarkable. Upper chest: No consolidation within the imaged lung apices. Review of the MIP images confirms the above findings CTA HEAD FINDINGS Anterior circulation: The intracranial internal carotid arteries are patent. The M1 middle cerebral arteries are patent. There is moderate stenosis within the distal M1 right middle cerebral artery. No definite right M2 proximal branch occlusion is identified. However, there is a paucity of visualized proximal superior division right M2 MCA branches. The right anterior cerebral artery is patent without significant proximal stenosis. There is a high-grade focal stenosis within the distal A2/proximal A3 left anterior cerebral artery. No intracranial aneurysm is identified. Posterior circulation: The intracranial vertebral arteries are patent. The basilar artery is patent. The posterior cerebral arteries are patent. There is a high-grade focal stenosis within the P2 left PCA. Additionally, there are multifocal high-grade stenoses within the P4 right PCA. Posterior communicating arteries are present bilaterally. Venous sinuses: Within limitations of contrast timing, no convincing thrombus. Anatomic variants: None significant. Review of the MIP images confirms the above findings These results were called by telephone at the time of interpretation on 08/03/2020 at 8:28 pm to provider [facility] , who verbally acknowledged these results. IMPRESSION: CT head: 1.  Subcentimeter age-indeterminate infarct within the right cerebellum. 2. Multiple chronic appearing lacunar infarcts within the bilateral basal ganglia. 3. Background mild generalized parenchymal atrophy with advanced chronic small vessel ischemic disease. CTA neck: The common carotid, internal carotid and vertebral arteries are patent within the neck without hemodynamically significant stenosis. CTA head: 1. No definite proximal right M2 branch occlusion is identified. However, there is a paucity of visualized proximal M2 superior division right MCA branch vessels. If there is concern for an acute right MCA territory stroke, consider catheter based angiography for further evaluation. 2. Intracranial atherosclerotic disease with multifocal stenoses, most notably as follows. 3. Moderate stenosis within the distal M1 right middle cerebral artery. 4. High-grade focal stenosis within the distal A2/proximal A3 left anterior cerebral artery. 5. High-grade focal stenosis within the P2 left PCA. 6. High-grade stenoses within the P4 right PCA. Electronically Signed   By: Jackey Loge DO   On: 08/03/2020 20:29   MR Brain W and [place] Contrast  Result Date: 08/04/2020 CLINICAL DATA:  Initial evaluation for neuro deficit, stroke suspected. EXAM: MRI HEAD WITHOUT AND WITH CONTRAST TECHNIQUE: Multiplanar, multiecho pulse sequences of the brain and surrounding structures were obtained without and with intravenous contrast. CONTRAST:  10mL GADAVIST GADOBUTROL 1 MMOL/ML IV SOLN COMPARISON:  Comparison made with prior CTA from 08/03/2020. FINDINGS: Brain: Advanced cerebral atrophy for age. Patchy and confluent T2/FLAIR hyperintensity within the periventricular deep white matter both cerebral hemispheres, most consistent with chronic microvascular ischemic disease, advanced for age. Multiple scattered remote lacunar infarcts present about the bilateral basal ganglia. Associated chronic hemosiderin staining noted about several of these  infarcts. Additionally, there are multiple small remote right cerebellar infarcts. Patchy small volume restricted diffusion seen involving the cortical and subcortical right frontal parietal region, consistent with acute right MCA territory infarcts. Involvement of the pre and postcentral gyri noted. Largest area of infarction seen at the postcentral gyrus and measures up to 1.6 cm in diameter. No associated hemorrhage or mass effect. Gray-white matter differentiation otherwise maintained. No acute intracranial hemorrhage elsewhere within the brain. No mass lesion, midline shift or mass effect. Ventricles normal size without hydrocephalus. No extra-axial fluid collection. Pituitary gland and suprasellar region within normal limits. No visible parenchymal signal abnormality seen about the medulla or brainstem with thin section imaging. No abnormal enhancement. Vascular: Major intracranial vascular flow voids are maintained. Skull and upper cervical spine: Craniocervical junction within normal limits. Bone marrow signal intensity within normal limits. No focal marrow replacing lesion. No scalp soft tissue abnormality. Sinuses/Orbits: Globes and orbital soft tissues demonstrate no acute finding. Mild scattered mucosal thickening noted within the sphenoid ethmoidal sinuses. No mastoid effusion. Inner ear structures grossly normal. Other: None. IMPRESSION: 1. Patchy small volume acute right MCA territory infarcts involving the right frontoparietal region as above. No associated hemorrhage or mass effect. 2. Underlying advanced cerebral atrophy and chronic microvascular ischemic disease for age, with multiple remote lacunar infarcts about the bilateral basal ganglia and right cerebellum. Electronically Signed   By: Rise Mu M.D.   On: 08/04/2020 02:18   DG Swallowing Func-Speech Pathology  Result Date: 08/05/2020 Objective Swallowing Evaluation: Type of Study: MBS-Modified Barium Swallow Study  Patient  Details Name: Adam [name] MRN: [id number] Date of Birth: [birth year] Today's Date: 08/05/2020 Time: SLP Start Time (ACUTE ONLY): 8295 -SLP Stop Time (ACUTE ONLY): 0902 SLP Time Calculation (min) (ACUTE ONLY): 23 min Past Medical History: No past medical history on file. Past Surgical History: No past surgical history on file. HPI: Patient is a 59 y.o. 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 Per chart review, his sister had reported she noticed bifacial weakness, significant worsened speech and significantly worsened gait. MRI brain revealed patchy small volume acute right MCA territory infarcts involving right frontoparietal region but no associated hemorrhage or mass effect, as well as underlying advanced cerebral atrophy and chronic microvascular ischemic disease, multiple remote lacunar infarcts about the basal ganglia and right cerebellum.  Assessment / Plan / Recommendation CHL IP CLINICAL IMPRESSIONS 08/05/2020 Clinical Impression Pt presents with oropharyngeal dysphagia characterized by reduced labial seal, impaired bolus propulsion, impaired bolus cohesion, reduced lingual retraction, reduced anterior laryngeal movement, and a pharyngeal delay. He demonstrated anterior spillage, oral holding, lingual pumping, piecemeal deglutition, premature spillage, vallecular residue, pyriform sinus residue, penetration (PAS 3) of thin liquids and silent aspiration (PAS 8) of nectar thick liquids secondary to the pharyngeal delay. Pt's most significant impairment is oral holding with it sometimes being over a minute in length. Pt required verbal and tactile cues to initiate a swallow and anterior spillage was still noted intermittently when pt attempted to swallow.  Pt's swallow was most timely with  with puree solids and honey thick liquids via cup. Amount of pharyngeal residue  increased with bolus sizes. 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 Pt is safest with his current diet of dysphagia 1 (puree) solids and honey thick liquids. However, considering the level of support required for swallowing of limited boluses during the study, it is anticipated that may not be able to maintain adequate nutrition or hydration p.o. and that supplemental nonoral nutrition may be needed in the future. SLP will follow for dysphagia treatment.  SLP Visit Diagnosis Dysphagia, oropharyngeal phase (R13.12) Attention and concentration deficit following -- Frontal lobe and executive function deficit following -- Impact on safety and function Mild aspiration risk;Moderate aspiration risk   [facility] IP TREATMENT RECOMMENDATION 08/05/2020 Treatment Recommendations Therapy as outlined in treatment plan below   Prognosis 08/05/2020 Prognosis for Safe Diet Advancement Good Barriers to Reach Goals Cognitive deficits;Severity of deficits Barriers/Prognosis Comment -- [facility] IP DIET RECOMMENDATION 08/05/2020 SLP Diet Recommendations Honey thick liquids;Dysphagia 1 (Puree) solids Liquid Administration via Cup;Straw Medication Administration Crushed with puree Compensations Slow rate;Small sips/bites;Minimize environmental distractions;Follow solids with liquid;Other (Comment) Postural Changes Remain semi-upright after after feeds/meals (Comment)   [facility] IP OTHER RECOMMENDATIONS 08/05/2020 Recommended Consults -- Oral Care Recommendations Oral care BID Other Recommendations --   [facility] IP FOLLOW UP RECOMMENDATIONS 08/05/2020 Follow up Recommendations Inpatient Rehab   [facility] IP FREQUENCY AND DURATION 08/05/2020 Speech Therapy Frequency (ACUTE ONLY) min 2x/week Treatment Duration 2 weeks      [facility] IP ORAL PHASE 08/05/2020 Oral Phase Impaired Oral - Pudding Teaspoon -- Oral - Pudding Cup -- Oral - Honey Teaspoon -- Oral - Honey Cup Reduced posterior propulsion;Holding of bolus;Piecemeal  swallowing;Right anterior bolus loss;Left anterior bolus loss;Lingual pumping;Delayed oral transit;Decreased bolus cohesion;Premature spillage Oral - Nectar Teaspoon -- Oral - Nectar Cup Reduced posterior propulsion;Holding of bolus;Piecemeal swallowing;Right anterior bolus loss;Left anterior bolus loss;Lingual pumping;Delayed oral transit;Decreased bolus cohesion;Premature spillage Oral - Nectar Straw Reduced posterior propulsion;Holding of bolus;Piecemeal swallowing;Right anterior bolus loss;Left anterior bolus loss;Lingual pumping;Delayed oral transit;Decreased bolus cohesion;Premature spillage Oral - Thin Teaspoon -- Oral - Thin Cup Reduced posterior propulsion;Holding of bolus;Piecemeal swallowing;Right anterior bolus loss;Left anterior bolus loss;Lingual pumping;Delayed oral transit;Decreased bolus cohesion;Premature spillage Oral - Thin Straw Reduced posterior propulsion;Holding of bolus;Piecemeal swallowing;Right anterior bolus loss;Left anterior bolus loss;Lingual pumping;Delayed oral transit;Decreased bolus cohesion;Premature spillage Oral - Puree Reduced posterior propulsion;Holding of bolus;Piecemeal swallowing;Right anterior bolus loss;Left anterior bolus loss;Lingual pumping;Delayed oral transit;Decreased bolus cohesion;Premature spillage Oral - Mech Soft -- Oral - Regular -- Oral - Multi-Consistency -- Oral - Pill -- Oral Phase - Comment --  [facility] IP PHARYNGEAL PHASE 08/05/2020 Pharyngeal Phase Impaired Pharyngeal- Pudding Teaspoon -- Pharyngeal -- Pharyngeal- Pudding Cup -- Pharyngeal -- Pharyngeal- Honey Teaspoon Reduced anterior laryngeal mobility;Delayed swallow initiation-vallecula;Delayed swallow initiation-pyriform sinuses;Pharyngeal residue - valleculae;Pharyngeal residue - pyriform;Reduced tongue base retraction Pharyngeal -- Pharyngeal- Honey Cup -- Pharyngeal -- Pharyngeal- Nectar Teaspoon -- Pharyngeal -- Pharyngeal- Nectar Cup Reduced anterior laryngeal mobility;Delayed swallow  initiation-vallecula;Delayed swallow initiation-pyriform sinuses;Pharyngeal residue - valleculae;Pharyngeal residue - pyriform;Reduced tongue base retraction;Penetration/Aspiration before swallow Pharyngeal Material enters airway, passes BELOW cords without attempt by patient to eject out (silent aspiration) Pharyngeal- Nectar Straw Reduced anterior laryngeal mobility;Delayed swallow initiation-vallecula;Delayed swallow initiation-pyriform sinuses;Pharyngeal residue - valleculae;Pharyngeal residue - pyriform;Reduced tongue base retraction Pharyngeal -- Pharyngeal- Thin Teaspoon -- Pharyngeal -- Pharyngeal- Thin Cup Reduced anterior laryngeal mobility;Delayed swallow initiation-vallecula;Delayed swallow initiation-pyriform sinuses;Pharyngeal residue - valleculae;Pharyngeal residue - pyriform;Reduced tongue base retraction;Penetration/Aspiration during swallow Pharyngeal Material enters airway, remains ABOVE vocal  cords and not ejected out Pharyngeal- Thin Straw Reduced anterior laryngeal mobility;Delayed swallow initiation-vallecula;Delayed swallow initiation-pyriform sinuses;Pharyngeal residue - valleculae;Pharyngeal residue - pyriform;Reduced tongue base retraction Pharyngeal -- Pharyngeal- Puree Reduced anterior laryngeal mobility;Delayed swallow initiation-vallecula;Delayed swallow initiation-pyriform sinuses;Pharyngeal residue - valleculae;Pharyngeal residue - pyriform;Reduced tongue base retraction Pharyngeal -- Pharyngeal- Mechanical Soft -- Pharyngeal -- Pharyngeal- Regular -- Pharyngeal -- Pharyngeal- Multi-consistency -- Pharyngeal -- Pharyngeal- Pill -- Pharyngeal -- Pharyngeal Comment --  No flowsheet data found. Shanika I. Vear Clock, MS, CCC-SLP Acute Rehabilitation Services Office number [phone] Pager [phone] Scheryl Marten 08/05/2020, 10:11 AM              VAS [place] TRANSCRANIAL DOPPLER W BUBBLES  Result Date: 08/06/2020  Transcranial Doppler with Bubble Indications: Stroke. Comparison  Study: No prior study Performing Technologist: Gertie Fey MHA, RDMS, RVT, RDCS  Examination Guidelines: A complete evaluation includes B-mode imaging, spectral Doppler, color Doppler, and power Doppler as needed of all accessible portions of each vessel. Bilateral testing is considered an integral part of a complete examination. Limited examinations for reoccurring indications may be performed as noted.  Summary: No HITS at rest or during Valsalva. Negative transcranial Doppler Bubble study with no evidence of right to left intracardiac communication.  A vascular evaluation was performed. The right middle cerebral artery was studied. An IV was inserted into the patient's right forearm. Verbal informed consent was obtained.  Negative TCD Bubble study *See table(s) above for TCD measurements and observations.  Diagnosing physician: Delia Heady MD Electronically signed by Delia Heady MD on 08/06/2020 at 12:32:56 PM.    Final    ECHO TEE  Result Date: 08/09/2020    TRANSESOPHOGEAL ECHO REPORT   Patient Name:   Adam [name] Date of Exam: 08/09/2020 Medical Rec #:  [id number]      Height: Accession #:    [id number]     Weight: Date of Birth:  December 31, 1960      BSA: Patient Age:    58 years       BP:           129/96 mmHg Patient Gender: M              HR:           93 bpm. Exam Location:  Inpatient Procedure: Transesophageal Echo, Color Doppler and Saline Contrast Bubble Study Indications:     Stroke  History:         Patient has prior history of Echocardiogram examinations, most                  recent 08/04/2020. Risk Factors:Hypertension and Current Smoker.  Sonographer:     Renella Cunas RDCS Referring Phys:  [id number] Manson Passey Diagnosing Phys: Dietrich Pates MD PROCEDURE: The transesophogeal probe was passed without difficulty through the esophogus of the patient. Sedation performed by different physician. The patient was monitored while under deep sedation. Anesthestetic sedation was provided  intravenously by Anesthesiology: 134.75mg  of Propofol, 40mg  of Lidocaine. The patient developed no complications during the procedure. IMPRESSIONS  1. Left ventricular ejection fraction, by estimation, is 55 to 60%. The left ventricle has normal function.  2. Right ventricular systolic function is normal. The right ventricular size is normal.  3. No left atrial/left atrial appendage thrombus was detected.  4. The mitral valve is normal in structure. Trivial mitral valve regurgitation.  5. The aortic valve is normal in structure. Aortic valve regurgitation is trivial.  6. Minimal plaquing of the thoracic aorta (fixed). FINDINGS  Left  Ventricle: Left ventricular ejection fraction, by estimation, is 55 to 60%. The left ventricle has normal function. The left ventricular internal cavity size was normal in size. Right Ventricle: The right ventricular size is normal. No increase in right ventricular wall thickness. Right ventricular systolic function is normal. Left Atrium: Left atrial size was normal in size. No left atrial/left atrial appendage thrombus was detected. Right Atrium: Right atrial size was normal in size. Pericardium: There is no evidence of pericardial effusion. Mitral Valve: The mitral valve is normal in structure. Trivial mitral valve regurgitation. Tricuspid Valve: The tricuspid valve is normal in structure. Tricuspid valve regurgitation is trivial. Aortic Valve: The aortic valve is normal in structure. Aortic valve regurgitation is trivial. Pulmonic Valve: The pulmonic valve was normal in structure. Pulmonic valve regurgitation is mild. Aorta: Minimal plaquing of the thoracic aorta (fixed). The aortic root is normal in size and structure. IAS/Shunts: No atrial level shunt detected by color flow Doppler. Agitated saline contrast was given intravenously to evaluate for intracardiac shunting. Dietrich Pates MD Electronically signed by Dietrich Pates MD Signature Date/Time: 08/09/2020/5:52:07 PM    Final     ECHOCARDIOGRAM COMPLETE BUBBLE STUDY  Result Date: 08/04/2020    ECHOCARDIOGRAM REPORT   Patient Name:   Adam [name] Date of Exam: 08/04/2020 Medical Rec #:  [id number]      Height: Accession #:    [id number]     Weight: Date of Birth:  [birth year]      BSA: Patient Age:    58 years       BP:           171/70 mmHg Patient Gender: M              HR:           55 bpm. Exam Location:  Inpatient Procedure: 2D Echo Indications:    Stroke 434.91 / I63.9  History:        Patient has no prior history of Echocardiogram examinations.                 Stroke; Risk Factors:Hypertension and Current Smoker.  Sonographer:    Jeryl Columbia Referring Phys: [id number] CAROLE N HALL IMPRESSIONS  1. Left ventricular ejection fraction, by estimation, is 60 to 65%. The left ventricle has normal function. The left ventricle has no regional wall motion abnormalities. Left ventricular diastolic parameters were normal.  2. Right ventricular systolic function is normal. The right ventricular size is normal.  3. The mitral valve is normal in structure. Trivial mitral valve regurgitation. No evidence of mitral stenosis.  4. The aortic valve is normal in structure. Aortic valve regurgitation is not visualized. No aortic stenosis is present.  5. The inferior vena cava is normal in size with greater than 50% respiratory variability, suggesting right atrial pressure of 3 mmHg.  6. Agitated saline contrast bubble study was negative, with no evidence of any interatrial shunt. FINDINGS  Left Ventricle: Left ventricular ejection fraction, by estimation, is 60 to 65%. The left ventricle has normal function. The left ventricle has no regional wall motion abnormalities. The left ventricular internal cavity size was normal in size. There is  no left ventricular hypertrophy. Left ventricular diastolic parameters were normal. Right Ventricle: The right ventricular size is normal. No increase in right ventricular wall thickness. Right ventricular systolic  function is normal. Left Atrium: Left atrial size was normal in size. Right Atrium: Right atrial size was normal in size. Pericardium: There is no evidence of pericardial  effusion. Mitral Valve: The mitral valve is normal in structure. There is mild thickening of the mitral valve leaflet(s). Trivial mitral valve regurgitation. No evidence of mitral valve stenosis. Tricuspid Valve: The tricuspid valve is normal in structure. Tricuspid valve regurgitation is not demonstrated. No evidence of tricuspid stenosis. Aortic Valve: The aortic valve is normal in structure. Aortic valve regurgitation is not visualized. No aortic stenosis is present. Pulmonic Valve: The pulmonic valve was normal in structure. Pulmonic valve regurgitation is not visualized. No evidence of pulmonic stenosis. Aorta: The aortic root is normal in size and structure. Venous: The inferior vena cava is normal in size with greater than 50% respiratory variability, suggesting right atrial pressure of 3 mmHg. IAS/Shunts: No atrial level shunt detected by color flow Doppler. Agitated saline contrast was given intravenously to evaluate for intracardiac shunting. Agitated saline contrast bubble study was negative, with no evidence of any interatrial shunt. Charlton HawsPeter Nishan MD Electronically signed by Charlton HawsPeter Nishan MD Signature Date/Time: 08/04/2020/3:38:04 PM    Final      Time Spent in minutes > 30     Laverna PeaceShayla D Honore Wipperfurth M.D on 08/11/2020 at 2:46 PM  To page go to [email or web address] - password [email or web address]

## 2020-08-11 NOTE — Progress Notes (Signed)
STROKE TEAM PROGRESS NOTE   INTERVAL HISTORY No family at bedside.  Patient lying in bed, still has tube feeding, hypophonia.  No significant neuro changes.  He was able to swallow, but significance and oral time with excessive drooling.  Still on tube feeding.   OBJECTIVE Vitals:   08/09/20 2311 08/10/20 0805 08/10/20 1619 08/10/20 2156  BP: (!) 158/98 (!) 117/93 (!) 119/94 (!) 132/107  Pulse: 86 87 85 87  Resp: 20 16  18   Temp: (!) 97.5 F (36.4 C) 98 F (36.7 C) 97.9 F (36.6 C) 97.6 F (36.4 C)  TempSrc: Oral   Axillary  SpO2: 99%  98% 100%    CBC:  No results for input(s): WBC, NEUTROABS, HGB, HCT, MCV, PLT in the last 168 hours.  Basic Metabolic Panel:  Recent Labs  Lab 08/09/20 0101 08/09/20 1428 08/10/20 0443 08/10/20 1614  NA 140  --  141  --   K 3.8  --  3.5  --   CL 106  --  106  --   CO2 22  --  23  --   GLUCOSE 92  --  98  --   BUN 52*  --  48*  --   CREATININE 1.31*  --  1.06  --   CALCIUM 9.9  --  9.5  --   MG  --    < > 2.4 2.5*  PHOS  --    < > 3.0 2.8   < > = values in this interval not displayed.    Lipid Panel:     Component Value Date/Time   CHOL 123 08/04/2020 1142   TRIG 49 08/04/2020 1142   HDL 33 (L) 08/04/2020 1142   CHOLHDL 3.7 08/04/2020 1142   VLDL 10 08/04/2020 1142   LDLCALC 80 08/04/2020 1142   HgbA1c:  Lab Results  Component Value Date   HGBA1C 5.7 (H) 08/06/2020   Urine Drug Screen: No results found for: LABOPIA, COCAINSCRNUR, LABBENZ, AMPHETMU, THCU, LABBARB  Alcohol Level     Component Value Date/Time   ETH <10 08/03/2020 2354    IMAGING  CT HEAD CT Angio Head W or Wo Contrast CT ANGIO NECK W OR WO CONTRAST 08/03/2020 IMPRESSION:   CT head:  1. Subcentimeter age-indeterminate infarct within the right cerebellum.  2. Multiple chronic appearing lacunar infarcts within the bilateral basal ganglia.  3. Background mild generalized parenchymal atrophy with advanced chronic small vessel ischemic disease.   CTA  neck:  The common carotid, internal carotid and vertebral arteries are patent within the neck without hemodynamically significant stenosis.   CTA head:  1. No definite proximal right M2 branch occlusion is identified. However, there is a paucity of visualized proximal M2 superior division right MCA branch vessels. If there is concern for an acute right MCA territory stroke, consider catheter based angiography for further evaluation.  2. Intracranial atherosclerotic disease with multifocal stenoses, most notably as follows.  3. Moderate stenosis within the distal M1 right middle cerebral artery.  4. High-grade focal stenosis within the distal A2/proximal A3 left anterior cerebral artery.  5. High-grade focal stenosis within the P2 left PCA.  6. High-grade stenoses within the P4 right PCA.   MR Brain W and Wo Contrast 08/04/2020 IMPRESSION:  1. Patchy small volume acute right MCA territory infarcts involving the right frontoparietal region as above. No associated hemorrhage or mass effect.  2. Underlying advanced cerebral atrophy and chronic microvascular ischemic disease for age, with multiple remote lacunar infarcts  about the bilateral basal ganglia and right cerebellum.   Transthoracic Echocardiogram  Normal ejection fraction 60 to 65%.  No cardiac source of embolism  ECG - SR rate 82 BPM. (See cardiology reading for complete details)  PHYSICAL EXAM  Temp:  [97.6 F (36.4 C)-98 F (36.7 C)] 97.6 F (36.4 C) (09/18 2156) Pulse Rate:  [85-87] 87 (09/18 2156) Resp:  [16-18] 18 (09/18 2156) BP: (117-132)/(93-107) 132/107 (09/18 2156) SpO2:  [98 %-100 %] 100 % (09/18 2156)  General - Well nourished, well developed, in no apparent distress.  Ophthalmologic - fundi not visualized due to noncooperation.  Cardiovascular - Regular rhythm and rate.  Mental Status -  Level of arousal and orientation to time, place, and person were intact. Language including expression, naming,  repetition, comprehension was assessed and found intact. However, hypophonia, and moderate dysarthria  Fund of Knowledge was assessed and was intact.  Cranial Nerves II - XII - II - Visual field intact OU. III, IV, VI - Extraocular movements intact. V - Facial sensation intact bilaterally. VII - bilateral lower facial weakness, right more than left. VIII - Hearing & vestibular intact bilaterally. X - Palate elevation not able to see. Moderate dysarthria with hypophonia XI - Chin turning & shoulder shrug intact bilaterally. XII - Tongue protrusion incomplete.  Motor Strength - The patient's strength was normal in all extremities and pronator drift was absent.  Bulk was normal and fasciculations were absent.   Motor Tone - Muscle tone was assessed at the neck and appendages and was normal.  Reflexes - The patient's reflexes were symmetrical in all extremities and he had no pathological reflexes.  Sensory - Light touch, temperature/pinprick were assessed and were symmetrical.    Coordination - The patient had normal movements in the hands with no ataxia or dysmetria.  Tremor was absent.  Gait and Station - deferred.   ASSESSMENT/PLAN Adam [name] is a 59 y.o. male with history of hypertension, prior stroke (residual right-sided weakness and dysarthria), active smoking (reports 1 cigarette/day) presenting with acute onset drooling, patchy left arm and torso sensory changes, headache, cough, bifacial weakness, significantly worsened speech and gait with recent weight loss. He did not receive IV t-PA due to late presentation (>4.5 hours from time of onset).   Stroke: acute right MCA patchy infarcts, embolic vs. Large vessel disease   CT head - age-indeterminate infarct within the right cerebellum. Multiple chronic appearing lacunar infarcts within the bilateral basal ganglia.   MRI head - Patchy small volume acute right MCA territory infarcts involving the right frontoparietal region  as above. multiple remote lacunar infarcts at bilateral basal ganglia and right cerebellum.   CTA Head and neck- right M1/M2, left A2/A3, left P2 and right P4 stenosis.  2D Echo - EF 60 to 65%.    Sars Corona Virus 2 - negative  LDL - 80  HgbA1c - 5.7   UDS - pending   Hypercoagulable labs pending [CRP 1.4 (H)]  VTE prophylaxis - Lovenox  No antithrombotic prior to admission, now on aspirin 81 mg daily and Plavix 75 DAPT.  Patient counseled to be compliant with his antithrombotic medications  Ongoing aggressive stroke risk factor management  Therapy recommendations:  HH OT  Disposition:  Pending  Dysphagia  Did not pass a swallow  Significant drooling and hypophonia  Has core track placed  On tube feeding  Speech on board  Hypertension  Home BP meds: none  Current BP meds: Amlodipine 5, lisinopril 10, HCTZ 25  Stable  Avoid low BP . Long-term BP goal normotensive  Hyperlipidemia  Home Lipid lowering medication: none   LDL 80, goal < 70  Current lipid lowering medication: Lipitor 80 mg daily  Continue statin at discharge  B12 deficiency  B12 level 90  On B12 supplement  Homocystine level pending  Tobacco abuse  Current smoker  Smoking cessation counseling provided  Pt is willing to quit  Other Stroke Risk Factors  Previous ETOH use  Previous substance abuse - UDS pending  Other Active Problems  Code status - Full code  AKI, resolved - creatinine 1.27->1.31->1.06  Mild bradycardia [facility] day # 7  Marvel Plan, MD PhD Stroke Neurology 08/11/2020 3:30 PM     To contact Stroke Continuity provider, please refer to [email or web address]. After hours, contact General Neurology

## 2020-08-12 ENCOUNTER — Inpatient Hospital Stay (HOSPITAL_COMMUNITY): Payer: Medicaid Other

## 2020-08-12 LAB — HOMOCYSTEINE: Homocysteine: 16.8 umol/L — ABNORMAL HIGH (ref 0.0–14.5)

## 2020-08-12 LAB — GLUCOSE, CAPILLARY
Glucose-Capillary: 106 mg/dL — ABNORMAL HIGH (ref 70–99)
Glucose-Capillary: 126 mg/dL — ABNORMAL HIGH (ref 70–99)
Glucose-Capillary: 116 mg/dL — ABNORMAL HIGH (ref 70–99)
Glucose-Capillary: 130 mg/dL — ABNORMAL HIGH (ref 70–99)

## 2020-08-12 NOTE — Progress Notes (Signed)
Physical Therapy Treatment Patient Details Name: Adam [name] MRN: [id number] DOB: 1961-05-05 Today's Date: 08/12/2020    History of Present Illness 59 y.o. male with medical history significant for essential hypertension, prior CVA in 2013, seen in [place], who presented to [facility] ED from home with complaints of left arm numbness and drooling. MRI revealed patchy small volume acute right MCA territory infarcts involving the right frontoparietal region and multiple remote lacunar infarcts about the bilateral basal ganglia and right cerebellum.    PT Comments    Pt with flat affect and decreased interaction with PT vs previous sessions, pt acknowledges he is ready to go home. Pt ambulating good hallway distance with close guard for safety, x1 LOB during directional change which is mostly pt-corrected. Pt tolerated LE strengthening interventions well, but has limited tolerance at this time. PT continuing to recommend OPPT to address mobility deficits. Will continue to follow acutely.     Follow Up Recommendations  Supervision for mobility/OOB;Outpatient PT     Equipment Recommendations  Other (comment) (TBD)    Recommendations for Other Services       Precautions / Restrictions Precautions Precautions: Fall Restrictions Weight Bearing Restrictions: No    Mobility  Bed Mobility Overal bed mobility: Modified Independent             General bed mobility comments: Mod I for increased time.  Transfers Overall transfer level: Needs assistance Equipment used: None Transfers: Sit to/from Stand Sit to Stand: Supervision         General transfer comment: for safety  Ambulation/Gait Ambulation/Gait assistance: Min guard Gait Distance (Feet): 500 Feet Assistive device: None Gait Pattern/deviations: Step-through pattern;Decreased stride length;Shuffle;Narrow base of support;Decreased dorsiflexion - right Gait velocity: decr   General Gait Details: min guard for safety, x1  period of min assist for LOB during change in direction. Pt with bilateral knee recurvatum in stance phase, R>L. PT cuing for increased step length, increased foot clearance, and athletic stance during stance phase to minimize knee hyperextension.   Stairs             Wheelchair Mobility    Modified Rankin (Stroke Patients Only) Modified Rankin (Stroke Patients Only) Pre-Morbid Rankin Score: Slight disability Modified Rankin: Moderate disability     Balance Overall balance assessment: Needs assistance Sitting-balance support: No upper extremity supported;Feet supported Sitting balance-Leahy Scale: Good     Standing balance support: No upper extremity supported;During functional activity Standing balance-Leahy Scale: Fair                 High Level Balance Comments: stepping over obstacles x10            Cognition Arousal/Alertness: Awake/alert Behavior During Therapy: Flat affect;WFL for tasks assessed/performed Overall Cognitive Status: Impaired/Different from baseline Area of Impairment: Problem solving;Awareness;Following commands;Memory                     Memory: Decreased short-term memory Following Commands: Follows multi-step commands with increased time   Awareness: Emergent Problem Solving: Requires verbal cues;Slow processing General Comments: slow processing, with significantly flat affect today. Minimal verbalizations to PT, mostly nodding yes/no. Pt nods "yes" to question "is it frustrating trying to talk?"      Exercises General Exercises - Lower Extremity Ankle Circles/Pumps: Strengthening;AROM;Both;15 reps;Seated (min PT manual resistance against pt AROM DF) Heel Raises: AROM;Both;15 reps;Standing (with bilateral UE support, 2x15 for LE strengthening) Mini-Sqauts: AROM;Both;10 reps;Standing (squats to 90* knee flexion with bilateral UE support)    General Comments  General comments (skin integrity, edema, etc.): unable to manage  oral secretions, uses washcloth to wipe mouth during mobility      Pertinent Vitals/Pain Pain Assessment: Faces Faces Pain Scale: Hurts a little bit Pain Location: abdomen Pain Descriptors / Indicators: Other (Comment) (nausea) Pain Intervention(s): Limited activity within patient's tolerance;Monitored during session;Repositioned    Home Living                      Prior Function            PT Goals (current goals can now be found in the care plan section) Acute Rehab PT Goals Patient Stated Goal: go home PT Goal Formulation: With patient Time For Goal Achievement: 08/18/20 Potential to Achieve Goals: Good Progress towards PT goals: Progressing toward goals    Frequency    Min 4X/week      PT Plan Current plan remains appropriate    Co-evaluation              AM-PAC PT "6 Clicks" Mobility   Outcome Measure  Help needed turning from your back to your side while in a flat bed without using bedrails?: None Help needed moving from lying on your back to sitting on the side of a flat bed without using bedrails?: None Help needed moving to and from a bed to a chair (including a wheelchair)?: None Help needed standing up from a chair using your arms ([name].g., wheelchair or bedside chair)?: None Help needed to walk in hospital room?: A Little Help needed climbing 3-5 steps with a railing? : A Little 6 Click Score: 22    End of Session Equipment Utilized During Treatment: Gait belt Activity Tolerance: Patient tolerated treatment well Patient left: with call bell/phone within reach;in chair Nurse Communication: Mobility status PT Visit Diagnosis: Unsteadiness on feet (R26.81);Difficulty in walking, not elsewhere classified (R26.2)     Time: 5465-0354 PT Time Calculation (min) (ACUTE ONLY): 21 min  Charges:  $Gait Training: 8-22 mins                    Adam [name], PT Acute Rehabilitation Services Pager [phone]  Office [phone]    Adam [name]  Adam [name] 08/12/2020, 9:52 AM

## 2020-08-12 NOTE — Progress Notes (Addendum)
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 premature spillage, vallecular residue, pyriform sinus residue, penetration (PAS 3) of thin liquids and silent aspiration (PAS 8) of nectar thick liquids secondary to the pharyngeal delay. Pt's most significant impairment is oral holding with it sometimes being over a minute in length. Pt required verbal and tactile cues to initiate a swallow and anterior spillage was still noted intermittently when pt attempted to swallow.  Pt's swallow was most timely with with puree solids and honey thick liquids via cup. Amount of pharyngeal residue increased with bolus sizes. 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 Pt is safest with his current diet of dysphagia 1 (puree) solids and honey thick liquids. However, considering the level of support required for swallowing of limited boluses during the study, it is anticipated that may not be able to maintain adequate nutrition or hydration p.o. and that supplemental nonoral nutrition may be needed in the future. SLP will follow for dysphagia treatment.  SLP  Visit Diagnosis Dysphagia, oropharyngeal phase (R13.12) Attention and concentration deficit following -- Frontal lobe and executive function deficit following -- Impact on safety and function Mild aspiration risk;Moderate aspiration risk   CHL IP TREATMENT RECOMMENDATION 08/05/2020 Treatment Recommendations Therapy as outlined in treatment plan below   Prognosis 08/05/2020 Prognosis for Safe Diet Advancement Good Barriers to Reach Goals Cognitive deficits;Severity of deficits Barriers/Prognosis Comment -- [facility] IP DIET RECOMMENDATION 08/05/2020 SLP Diet Recommendations Honey thick liquids;Dysphagia 1 (Puree) solids Liquid Administration via Cup;Straw Medication Administration Crushed with puree Compensations Slow rate;Small sips/bites;Minimize environmental distractions;Follow solids with liquid;Other (Comment) Postural Changes Remain semi-upright after after feeds/meals (Comment)   [facility] IP OTHER RECOMMENDATIONS 08/05/2020 Recommended Consults -- Oral Care Recommendations Oral care BID Other Recommendations --   [facility] IP FOLLOW UP RECOMMENDATIONS 08/05/2020 Follow up Recommendations Inpatient Rehab   [facility]L IP FREQUENCY AND DURATION 08/05/2020 Speech Therapy Frequency (ACUTE ONLY) min 2x/week Treatment Duration 2 weeks      CHL IP ORAL PHASE 08/05/2020 Oral Phase Impaired Oral - Pudding Teaspoon -- Oral - Pudding Cup -- Oral - Honey Teaspoon -- Oral - Honey Cup Reduced posterior propulsion;Holding of bolus;Piecemeal swallowing;Right anterior bolus loss;Left anterior bolus loss;Lingual pumping;Delayed oral transit;Decreased bolus cohesion;Premature spillage Oral - Nectar Teaspoon -- Oral - Nectar Cup Reduced posterior propulsion;Holding of bolus;Piecemeal swallowing;Right anterior bolus loss;Left anterior bolus loss;Lingual pumping;Delayed oral transit;Decreased bolus cohesion;Premature spillage Oral - Nectar Straw Reduced posterior propulsion;Holding of bolus;Piecemeal swallowing;Right anterior bolus loss;Left anterior bolus  loss;Lingual pumping;Delayed oral transit;Decreased bolus cohesion;Premature spillage Oral - Thin Teaspoon -- Oral - Thin Cup Reduced posterior propulsion;Holding of bolus;Piecemeal swallowing;Right anterior bolus loss;Left anterior bolus loss;Lingual pumping;Delayed oral transit;Decreased bolus cohesion;Premature spillage Oral - Thin Straw Reduced posterior propulsion;Holding of bolus;Piecemeal swallowing;Right anterior bolus loss;Left anterior bolus loss;Lingual pumping;Delayed oral transit;Decreased  bolus cohesion;Premature spillage Oral - Puree Reduced posterior propulsion;Holding of bolus;Piecemeal swallowing;Right anterior bolus loss;Left anterior bolus loss;Lingual pumping;Delayed oral transit;Decreased bolus cohesion;Premature spillage Oral - Mech Soft -- Oral - Regular -- Oral - Multi-Consistency -- Oral - Pill -- Oral Phase - Comment --  CHL IP PHARYNGEAL PHASE 08/05/2020 Pharyngeal Phase Impaired Pharyngeal- Pudding Teaspoon -- Pharyngeal -- Pharyngeal- Pudding Cup -- Pharyngeal -- Pharyngeal- Honey Teaspoon Reduced anterior laryngeal mobility;Delayed swallow initiation-vallecula;Delayed swallow initiation-pyriform sinuses;Pharyngeal residue - valleculae;Pharyngeal residue - pyriform;Reduced tongue base retraction Pharyngeal -- Pharyngeal- Honey Cup -- Pharyngeal -- Pharyngeal- Nectar Teaspoon -- Pharyngeal -- Pharyngeal- Nectar Cup Reduced anterior laryngeal mobility;Delayed swallow initiation-vallecula;Delayed swallow initiation-pyriform sinuses;Pharyngeal residue - valleculae;Pharyngeal residue - pyriform;Reduced tongue base retraction;Penetration/Aspiration before swallow Pharyngeal Material enters airway, passes BELOW cords without attempt by patient to eject out (silent aspiration) Pharyngeal- Nectar Straw Reduced anterior laryngeal mobility;Delayed swallow initiation-vallecula;Delayed swallow initiation-pyriform sinuses;Pharyngeal residue - valleculae;Pharyngeal residue - pyriform;Reduced tongue  base retraction Pharyngeal -- Pharyngeal- Thin Teaspoon -- Pharyngeal -- Pharyngeal- Thin Cup Reduced anterior laryngeal mobility;Delayed swallow initiation-vallecula;Delayed swallow initiation-pyriform sinuses;Pharyngeal residue - valleculae;Pharyngeal residue - pyriform;Reduced tongue base retraction;Penetration/Aspiration during swallow Pharyngeal Material enters airway, remains ABOVE vocal cords and not ejected out Pharyngeal- Thin Straw Reduced anterior laryngeal mobility;Delayed swallow initiation-vallecula;Delayed swallow initiation-pyriform sinuses;Pharyngeal residue - valleculae;Pharyngeal residue - pyriform;Reduced tongue base retraction Pharyngeal -- Pharyngeal- Puree Reduced anterior laryngeal mobility;Delayed swallow initiation-vallecula;Delayed swallow initiation-pyriform sinuses;Pharyngeal residue - valleculae;Pharyngeal residue - pyriform;Reduced tongue base retraction Pharyngeal -- Pharyngeal- Mechanical Soft -- Pharyngeal -- Pharyngeal- Regular -- Pharyngeal -- Pharyngeal- Multi-consistency -- Pharyngeal -- Pharyngeal- Pill -- Pharyngeal -- Pharyngeal Comment --  No flowsheet data found. Shanika I. Vear Clock, MS, CCC-SLP Acute Rehabilitation Services Office number [phone] Pager [phone] Scheryl Marten 08/05/2020, 10:11 AM              VAS [place] TRANSCRANIAL DOPPLER W BUBBLES  Result Date: 08/06/2020  Transcranial Doppler with Bubble Indications: Stroke. Comparison Study: No prior study Performing Technologist: Gertie Fey MHA, RDMS, RVT, RDCS  Examination Guidelines: A complete evaluation includes B-mode imaging, spectral Doppler, color Doppler, and power Doppler as needed of all accessible portions of each vessel. Bilateral testing is considered an integral part of a complete examination. Limited examinations for reoccurring indications may be performed as noted.  Summary: No HITS at rest or during Valsalva. Negative transcranial Doppler Bubble study with no evidence of right to  left intracardiac communication.  A vascular evaluation was performed. The right middle cerebral artery was studied. An IV was inserted into the patient's right forearm. Verbal informed consent was obtained.  Negative TCD Bubble study *See table(s) above for TCD measurements and observations.  Diagnosing physician: Delia Heady MD Electronically signed by Delia Heady MD on 08/06/2020 at 12:32:56 PM.    Final    ECHO TEE  Result Date: 08/09/2020    TRANSESOPHOGEAL ECHO REPORT   Patient Name:   KEEVAN WOLZ Date of Exam: 08/09/2020 Medical Rec #:  [id number]      Height: Accession #:    [id number]     Weight: Date of Birth:  [birth year]      BSA: Patient Age:    58 years       BP:           129/96 mmHg Patient Gender: M              HR:           93 bpm. Exam Location:  Inpatient Procedure: Transesophageal Echo, Color Doppler and Saline Contrast  Bubble Study Indications:     Stroke  History:         Patient has prior history of Echocardiogram examinations, most                  recent 08/04/2020. Risk Factors:Hypertension and Current Smoker.  Sonographer:     Renella Cunas RDCS Referring Phys:  [id number] Manson Passey Diagnosing Phys: Dietrich Pates MD PROCEDURE: The transesophogeal probe was passed without difficulty through the esophogus of the patient. Sedation performed by different physician. The patient was monitored while under deep sedation. Anesthestetic sedation was provided intravenously by Anesthesiology: 134.  of Propofol,  of Lidocaine. The patient developed no complications during the procedure. IMPRESSIONS  1. Left ventricular ejection fraction, by estimation, is 55 to 60%. The left ventricle has normal function.  2. Right ventricular systolic function is normal. The right ventricular size is normal.  3. No left atrial/left atrial appendage thrombus was detected.  4. The mitral valve is normal in structure. Trivial mitral valve regurgitation.  5. The aortic valve is normal in structure. Aortic  valve regurgitation is trivial.  6. Minimal plaquing of the thoracic aorta (fixed). FINDINGS  Left Ventricle: Left ventricular ejection fraction, by estimation, is 55 to 60%. The left ventricle has normal function. The left ventricular internal cavity size was normal in size. Right Ventricle: The right ventricular size is normal. No increase in right ventricular wall thickness. Right ventricular systolic function is normal. Left Atrium: Left atrial size was normal in size. No left atrial/left atrial appendage thrombus was detected. Right Atrium: Right atrial size was normal in size. Pericardium: There is no evidence of pericardial effusion. Mitral Valve: The mitral valve is normal in structure. Trivial mitral valve regurgitation. Tricuspid Valve: The tricuspid valve is normal in structure. Tricuspid valve regurgitation is trivial. Aortic Valve: The aortic valve is normal in structure. Aortic valve regurgitation is trivial. Pulmonic Valve: The pulmonic valve was normal in structure. Pulmonic valve regurgitation is mild. Aorta: Minimal plaquing of the thoracic aorta (fixed). The aortic root is normal in size and structure. IAS/Shunts: No atrial level shunt detected by color flow Doppler. Agitated saline contrast was given intravenously to evaluate for intracardiac shunting. Dietrich Pates MD Electronically signed by Dietrich Pates MD Signature Date/Time: 08/09/2020/5:52:07 PM    Final    ECHOCARDIOGRAM COMPLETE BUBBLE STUDY  Result Date: 08/04/2020    ECHOCARDIOGRAM REPORT   Patient Name:   BROADY LAFOY Date of Exam: 08/04/2020 Medical Rec #:  [id number]      Height: Accession #:    [id number]     Weight: Date of Birth:  08-23-1961      BSA: Patient Age:    58 years       BP:           171/70 mmHg Patient Gender: M              HR:           55 bpm. Exam Location:  Inpatient Procedure: 2D Echo Indications:    Stroke 434.91 / I63.9  History:        Patient has no prior history of Echocardiogram examinations.                  Stroke; Risk Factors:Hypertension and Current Smoker.  Sonographer:    Jeryl Columbia Referring Phys: [id number] CAROLE N HALL IMPRESSIONS  1. Left ventricular ejection fraction, by estimation, is 60 to 65%. The left ventricle has normal function. The left ventricle has  no regional wall motion abnormalities. Left ventricular diastolic parameters were normal.  2. Right ventricular systolic function is normal. The right ventricular size is normal.  3. The mitral valve is normal in structure. Trivial mitral valve regurgitation. No evidence of mitral stenosis.  4. The aortic valve is normal in structure. Aortic valve regurgitation is not visualized. No aortic stenosis is present.  5. The inferior vena cava is normal in size with greater than 50% respiratory variability, suggesting right atrial pressure of 3 mmHg.  6. Agitated saline contrast bubble study was negative, with no evidence of any interatrial shunt. FINDINGS  Left Ventricle: Left ventricular ejection fraction, by estimation, is 60 to 65%. The left ventricle has normal function. The left ventricle has no regional wall motion abnormalities. The left ventricular internal cavity size was normal in size. There is  no left ventricular hypertrophy. Left ventricular diastolic parameters were normal. Right Ventricle: The right ventricular size is normal. No increase in right ventricular wall thickness. Right ventricular systolic function is normal. Left Atrium: Left atrial size was normal in size. Right Atrium: Right atrial size was normal in size. Pericardium: There is no evidence of pericardial effusion. Mitral Valve: The mitral valve is normal in structure. There is mild thickening of the mitral valve leaflet(s). Trivial mitral valve regurgitation. No evidence of mitral valve stenosis. Tricuspid Valve: The tricuspid valve is normal in structure. Tricuspid valve regurgitation is not demonstrated. No evidence of tricuspid stenosis. Aortic Valve: The aortic valve is  normal in structure. Aortic valve regurgitation is not visualized. No aortic stenosis is present. Pulmonic Valve: The pulmonic valve was normal in structure. Pulmonic valve regurgitation is not visualized. No evidence of pulmonic stenosis. Aorta: The aortic root is normal in size and structure. Venous: The inferior vena cava is normal in size with greater than 50% respiratory variability, suggesting right atrial pressure of 3 mmHg. IAS/Shunts: No atrial level shunt detected by color flow Doppler. Agitated saline contrast was given intravenously to evaluate for intracardiac shunting. Agitated saline contrast bubble study was negative, with no evidence of any interatrial shunt. Charlton Haws MD Electronically signed by Charlton Haws MD Signature Date/Time: 08/04/2020/3:38:04 PM    Final      Time Spent in minutes > 30     Laverna Peace M.D on 08/12/2020 at 11:22 AM  To page go to [email or web address] - password [facility]

## 2020-08-12 NOTE — Progress Notes (Signed)
   08/12/20 0600  Assess: MEWS Score  ECG Heart Rate (!) 113  Resp 20  Assess: MEWS Score  MEWS Temp 0  MEWS Systolic 0  MEWS Pulse 2  MEWS RR 0  MEWS LOC 0  MEWS Score 2  MEWS Score Color Yellow  Assess: if the MEWS score is Yellow or Red  Were vital signs taken at a resting state? Yes  Focused Assessment No change from prior assessment  Early Detection of Sepsis Score *See Row Information* Low  MEWS guidelines implemented *See Row Information* No, vital signs rechecked (sl. elevated HR, will recheck)

## 2020-08-12 NOTE — Progress Notes (Signed)
   08/12/20 0000  Assess: MEWS Score  ECG Heart Rate (!) 145  Resp (!) 21  Assess: MEWS Score  MEWS Temp 0  MEWS Systolic 0  MEWS Pulse 3  MEWS RR 1  MEWS LOC 0  MEWS Score 4  MEWS Score Color Red  Assess: if the MEWS score is Yellow or Red  Were vital signs taken at a resting state? No (Pt moving around)  Focused Assessment No change from prior assessment  Early Detection of Sepsis Score *See Row Information* Low  MEWS guidelines implemented *See Row Information* No, vital signs rechecked (will recheck HR at resting state)

## 2020-08-12 NOTE — Progress Notes (Signed)
STROKE TEAM PROGRESS NOTE   INTERVAL HISTORY No family at bedside.  Patient lying in bed, still has tube feeding, hypophonia. Reported increased drooling and no significant improvement of swallow dysfunction.  Likely need PEG tube.  Okay to hold off Plavix for 5 days and continue aspirin only given the meantime.   OBJECTIVE Vitals:   08/12/20 0500 08/12/20 0600 08/12/20 0615 08/12/20 0803  BP:    (!) 116/98  Pulse:    99  Resp: 18 20 14 17   Temp:    98.9 F (37.2 C)  TempSrc:      SpO2:    98%    CBC:  No results for input(s): WBC, NEUTROABS, HGB, HCT, MCV, PLT in the last 168 hours.  Basic Metabolic Panel:  Recent Labs  Lab 08/09/20 0101 08/09/20 1428 08/10/20 0443 08/10/20 1614  NA 140  --  141  --   K 3.8  --  3.5  --   CL 106  --  106  --   CO2 22  --  23  --   GLUCOSE 92  --  98  --   BUN 52*  --  48*  --   CREATININE 1.31*  --  1.06  --   CALCIUM 9.9  --  9.5  --   MG  --    < > 2.4 2.5*  PHOS  --    < > 3.0 2.8   < > = values in this interval not displayed.    Lipid Panel:     Component Value Date/Time   CHOL 123 08/04/2020 1142   TRIG 49 08/04/2020 1142   HDL 33 (L) 08/04/2020 1142   CHOLHDL 3.7 08/04/2020 1142   VLDL 10 08/04/2020 1142   LDLCALC 80 08/04/2020 1142   HgbA1c:  Lab Results  Component Value Date   HGBA1C 5.7 (H) 08/06/2020   Urine Drug Screen:     Component Value Date/Time   LABOPIA NONE DETECTED 08/11/2020 1704   COCAINSCRNUR NONE DETECTED 08/11/2020 1704   LABBENZ NONE DETECTED 08/11/2020 1704   AMPHETMU NONE DETECTED 08/11/2020 1704   THCU POSITIVE (A) 08/11/2020 1704   LABBARB NONE DETECTED 08/11/2020 1704    Alcohol Level     Component Value Date/Time   ETH <10 08/03/2020 2354    IMAGING  CT HEAD CT Angio Head W or Wo Contrast CT ANGIO NECK W OR WO CONTRAST 08/03/2020 IMPRESSION:   CT head:  1. Subcentimeter age-indeterminate infarct within the right cerebellum.  2. Multiple chronic appearing lacunar infarcts  within the bilateral basal ganglia.  3. Background mild generalized parenchymal atrophy with advanced chronic small vessel ischemic disease.   CTA neck:  The common carotid, internal carotid and vertebral arteries are patent within the neck without hemodynamically significant stenosis.   CTA head:  1. No definite proximal right M2 branch occlusion is identified. However, there is a paucity of visualized proximal M2 superior division right MCA branch vessels. If there is concern for an acute right MCA territory stroke, consider catheter based angiography for further evaluation.  2. Intracranial atherosclerotic disease with multifocal stenoses, most notably as follows.  3. Moderate stenosis within the distal M1 right middle cerebral artery.  4. High-grade focal stenosis within the distal A2/proximal A3 left anterior cerebral artery.  5. High-grade focal stenosis within the P2 left PCA.  6. High-grade stenoses within the P4 right PCA.   MR Brain W and Wo Contrast 08/04/2020 IMPRESSION:  1. Patchy small volume acute  right MCA territory infarcts involving the right frontoparietal region as above. No associated hemorrhage or mass effect.  2. Underlying advanced cerebral atrophy and chronic microvascular ischemic disease for age, with multiple remote lacunar infarcts about the bilateral basal ganglia and right cerebellum.   Transthoracic Echocardiogram  Normal ejection fraction 60 to 65%.  No cardiac source of embolism  ECG - SR rate 82 BPM. (See cardiology reading for complete details)  PHYSICAL EXAM  Temp:  [97.8 F (36.6 C)-98.9 F (37.2 C)] 98.9 F (37.2 C) (09/20 0803) Pulse Rate:  [92-101] 99 (09/20 0803) Resp:  [14-23] 17 (09/20 0803) BP: (109-116)/(73-98) 116/98 (09/20 0803) SpO2:  [98 %-100 %] 98 % (09/20 0803)  General - Well nourished, well developed, in no apparent distress.  Ophthalmologic - fundi not visualized due to noncooperation.  Cardiovascular - Regular rhythm and  rate.  Mental Status -  Level of arousal and orientation to time, place, and person were intact. Language including expression, naming, repetition, comprehension was assessed and found intact. However, hypophonia, and moderate dysarthria  Fund of Knowledge was assessed and was intact.  Cranial Nerves II - XII - II - Visual field intact OU. III, IV, VI - Extraocular movements intact. V - Facial sensation intact bilaterally. VII - bilateral lower facial weakness, right more than left. VIII - Hearing & vestibular intact bilaterally. X - Palate elevation not able to see. Moderate dysarthria with hypophonia XI - Chin turning & shoulder shrug intact bilaterally. XII - Tongue protrusion incomplete.  Motor Strength - The patient's strength was normal in all extremities and pronator drift was absent.  Bulk was normal and fasciculations were absent.   Motor Tone - Muscle tone was assessed at the neck and appendages and was normal.  Reflexes - The patient's reflexes were symmetrical in all extremities and he had no pathological reflexes.  Sensory - Light touch, temperature/pinprick were assessed and were symmetrical.    Coordination - The patient had normal movements in the hands with no ataxia or dysmetria.  Tremor was absent.  Gait and Station - deferred.   ASSESSMENT/PLAN [name] is a 59 y.o. male with history of hypertension, prior stroke (residual right-sided weakness and dysarthria), active smoking (reports 1 cigarette/day) presenting with acute onset drooling, patchy left arm and torso sensory changes, headache, cough, bifacial weakness, significantly worsened speech and gait with recent weight loss. He did not receive IV t-PA due to late presentation (>4.5 hours from time of onset).   Stroke: acute right MCA patchy infarcts, embolic vs. Large vessel disease   CT head - age-indeterminate infarct within the right cerebellum. Multiple chronic appearing lacunar infarcts within  the bilateral basal ganglia.   MRI head - Patchy small volume acute right MCA territory infarcts involving the right frontoparietal region as above. multiple remote lacunar infarcts at bilateral basal ganglia and right cerebellum.   CTA Head and neck- right M1/M2, left A2/A3, left P2 and right P4 stenosis.  2D Echo - EF 60 to 65%.    Sars Corona Virus 2 - negative  LDL - 80  HgbA1c - 5.7   UDS - positive for THC  Hypercoagulable labs pending [CRP 1.4 (H)]  VTE prophylaxis - Lovenox  No antithrombotic prior to admission, was on aspirin 81 mg daily and Plavix 75 DAPT.  Okay to hold off Plavix for 5 days for potential PEG placement.  May consider resume DAPT for total 3 weeks once hemodynamically stable after procedure.  Patient counseled to be compliant with  his antithrombotic medications  Ongoing aggressive stroke risk factor management  Therapy recommendations:  HH OT  Disposition:  Pending  Dysphagia  Did not pass a swallow  Significant drooling and hypophonia  Has core track placed  On tube feeding  Speech on board  Likely need PEG tube, IR consulted.  Hypertension  Home BP meds: none  Current BP meds: Amlodipine 5, lisinopril 10, HCTZ 25  Stable  Avoid low BP . 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 Caleb Popp.  Marvel Plan, MD PhD Stroke  Neurology 08/12/2020 1:09 PM     To contact Stroke Continuity provider, please refer to [email or web address]. After hours, contact General Neurology

## 2020-08-12 NOTE — Progress Notes (Signed)
   08/11/20 2200  Assess: MEWS Score  ECG Heart Rate (!) 104  Resp (!) 23  Assess: MEWS Score  MEWS Temp 0  MEWS Systolic 0  MEWS Pulse 1  MEWS RR 1  MEWS LOC 0  MEWS Score 2  MEWS Score Color Yellow  Assess: if the MEWS score is Yellow or Red  Were vital signs taken at a resting state? Yes  Focused Assessment No change from prior assessment  Early Detection of Sepsis Score *See Row Information* Low  MEWS guidelines implemented *See Row Information* No, vital signs rechecked (HR/RR elevated, will recheck)

## 2020-08-12 NOTE — Progress Notes (Addendum)
  Speech Language Pathology Treatment: Dysphagia (dysarthria)  Patient Details Name: Adam [name] MRN: [id number] DOB: 03/10/1961 Today's Date: 08/12/2020 Time: 2725-3664 SLP Time Calculation (min) (ACUTE ONLY): 21 min  Assessment / Plan / Recommendation Clinical Impression  Adam [name]'s dysphagia remains severe without significant improvements. He consumed limited trials (SLP only) thin water via tsp/cup and Ensure with oropharyngeal transit assisted via cue from therapist to recline to approximately 30-40 degree positioning. He consumed total 6 teaspoons and attempted cup sip with poor labial closure and anterior spill of entire bolus. As pt in a relaxed reclined position left facial musculature noted to relax and able to achieve increased ROM during labial closure when cued. Noted documentation of plans for PEG (when off blood thinner 5 days?). Two delayed coughs noted today after trials. Pt may consume ice chips. Encouraged pt to continue to swallow saliva in reclined position versus always using suction and press entire tongue to hard palate or throat clear to assist swallow initiation.  Pt given cues to breathe deep prior to verbalization to increase intelligibility and reminded of goal for pt to identify when he is hypophonic and attempt to self correct.    HPI HPI: Patient is a 59 y.o. 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 Per chart review, his sister had reported she noticed bifacial weakness, significant worsened speech and significantly worsened gait. MRI brain revealed patchy small volume acute right MCA territory infarcts involving right frontoparietal region but no associated hemorrhage or mass effect, as well as underlying advanced cerebral atrophy and chronic microvascular ischemic disease, multiple remote lacunar infarcts about the basal  ganglia and right cerebellum.      SLP Plan  Continue with current plan of care       Recommendations  Diet recommendations: Other(comment) (ice chips) Medication Administration: Via alternative means                Oral Care Recommendations: Oral care QID Follow up Recommendations: Home health SLP SLP Visit Diagnosis: Dysarthria and anarthria (R47.1);Dysphagia, unspecified (R13.10) Plan: Continue with current plan of care       GO                Adam [name] 08/12/2020, 4:40 PM  Adam [name].Ed Nurse, children's [phone] Office [phone]

## 2020-08-13 ENCOUNTER — Encounter (HOSPITAL_COMMUNITY): Payer: Self-pay | Admitting: Internal Medicine

## 2020-08-13 LAB — GLUCOSE, CAPILLARY
Glucose-Capillary: 122 mg/dL — ABNORMAL HIGH (ref 70–99)
Glucose-Capillary: 94 mg/dL (ref 70–99)
Glucose-Capillary: 125 mg/dL — ABNORMAL HIGH (ref 70–99)
Glucose-Capillary: 112 mg/dL — ABNORMAL HIGH (ref 70–99)
Glucose-Capillary: 114 mg/dL — ABNORMAL HIGH (ref 70–99)
Glucose-Capillary: 123 mg/dL — ABNORMAL HIGH (ref 70–99)
Glucose-Capillary: 142 mg/dL — ABNORMAL HIGH (ref 70–99)

## 2020-08-13 LAB — BETA-2-GLYCOPROTEIN I ABS, IGG/M/A
Beta-2 Glyco I IgG: <9 GPI IgG units (ref 0–20)
Beta-2-Glycoprotein I IgA: <9 GPI IgA units (ref 0–25)
Beta-2-Glycoprotein I IgM: <9 GPI IgM units (ref 0–32)

## 2020-08-13 LAB — LIPOPROTEIN A (LPA): Lipoprotein (a): 79 nmol/L — ABNORMAL HIGH (ref <75.0)

## 2020-08-13 LAB — CARDIOLIPIN ANTIBODIES, IGG, IGM, IGA
Anticardiolipin IgA: <9 APL U/mL (ref 0–11)
Anticardiolipin IgG: <9 GPL U/mL (ref 0–14)
Anticardiolipin IgM: 19 MPL U/mL — ABNORMAL HIGH (ref 0–12)

## 2020-08-13 LAB — LUPUS ANTICOAGULANT PANEL
DRVVT: 47.8 s — ABNORMAL HIGH (ref 0.0–47.0)
PTT Lupus Anticoagulant: 43.9 s (ref 0.0–51.9)

## 2020-08-13 LAB — DRVVT MIX: dRVVT Mix: 39.4 s (ref 0.0–40.4)

## 2020-08-13 NOTE — Progress Notes (Signed)
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 with oropharyngeal dysphagia characterized by reduced labial seal, impaired bolus propulsion, impaired bolus cohesion, reduced lingual retraction, reduced anterior laryngeal movement, and a pharyngeal delay. He demonstrated anterior spillage, oral holding, lingual pumping, piecemeal deglutition, premature spillage, vallecular residue, pyriform sinus residue, penetration (PAS 3) of thin liquids and silent aspiration (PAS 8) of nectar thick liquids secondary to the pharyngeal delay. Pt's most significant impairment is oral holding with it sometimes being over a minute in length. Pt required verbal and tactile cues to initiate a swallow and anterior spillage was still noted intermittently when pt attempted to swallow.  Pt's swallow was most timely with with puree solids and honey thick liquids via cup. Amount of pharyngeal residue increased with bolus sizes. 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 Pt is safest with his current diet of dysphagia 1 (puree) solids and honey thick liquids. However, considering  the level of support required for swallowing of limited boluses during the study, it is anticipated that may not be able to maintain adequate nutrition or hydration p.o. and that supplemental nonoral nutrition may be needed in the future. SLP will follow for dysphagia treatment.  SLP Visit Diagnosis Dysphagia, oropharyngeal phase (R13.12) Attention and concentration deficit following -- Frontal lobe and executive function deficit following -- Impact on safety and function Mild aspiration risk;Moderate aspiration risk   CHL IP TREATMENT RECOMMENDATION 08/05/2020 Treatment Recommendations Therapy as outlined in treatment plan below   Prognosis 08/05/2020 Prognosis for Safe Diet Advancement Good Barriers to Reach Goals Cognitive deficits;Severity of deficits Barriers/Prognosis Comment -- CHL IP DIET RECOMMENDATION 08/05/2020 SLP Diet Recommendations Honey thick liquids;Dysphagia 1 (Puree) solids Liquid Administration via Cup;Straw Medication Administration Crushed with puree Compensations Slow rate;Small sips/bites;Minimize environmental distractions;Follow solids with liquid;Other (Comment) Postural Changes Remain semi-upright after after feeds/meals (Comment)   CHL IP OTHER RECOMMENDATIONS 08/05/2020 Recommended Consults -- Oral Care Recommendations Oral care BID Other Recommendations --   CHL IP FOLLOW UP RECOMMENDATIONS 08/05/2020 Follow up Recommendations Inpatient Rehab   CHL IP FREQUENCY AND DURATION 08/05/2020 Speech Therapy Frequency (ACUTE ONLY) min 2x/week Treatment Duration 2 weeks      CHL IP ORAL PHASE 08/05/2020 Oral Phase Impaired Oral - Pudding Teaspoon -- Oral - Pudding Cup -- Oral - Honey Teaspoon -- Oral - Honey Cup Reduced posterior propulsion;Holding of bolus;Piecemeal swallowing;Right anterior bolus loss;Left anterior bolus loss;Lingual pumping;Delayed oral transit;Decreased bolus cohesion;Premature spillage Oral - Nectar Teaspoon -- Oral - Nectar Cup Reduced posterior propulsion;Holding of  bolus;Piecemeal swallowing;Right anterior bolus loss;Left anterior bolus loss;Lingual pumping;Delayed oral transit;Decreased bolus cohesion;Premature spillage Oral - Nectar Straw Reduced posterior propulsion;Holding of bolus;Piecemeal swallowing;Right anterior bolus loss;Left  anterior bolus loss;Lingual pumping;Delayed oral transit;Decreased bolus cohesion;Premature spillage Oral - Thin Teaspoon -- Oral - Thin Cup Reduced posterior propulsion;Holding of bolus;Piecemeal swallowing;Right anterior bolus loss;Left anterior bolus loss;Lingual pumping;Delayed oral transit;Decreased bolus cohesion;Premature spillage Oral - Thin Straw Reduced posterior propulsion;Holding of bolus;Piecemeal swallowing;Right anterior bolus loss;Left anterior bolus loss;Lingual pumping;Delayed oral transit;Decreased bolus cohesion;Premature spillage Oral - Puree Reduced posterior propulsion;Holding of bolus;Piecemeal swallowing;Right anterior bolus loss;Left anterior bolus loss;Lingual pumping;Delayed oral transit;Decreased bolus cohesion;Premature spillage Oral - Mech Soft -- Oral - Regular -- Oral - Multi-Consistency -- Oral - Pill -- Oral Phase - Comment --  CHL IP PHARYNGEAL PHASE 08/05/2020 Pharyngeal Phase Impaired Pharyngeal- Pudding Teaspoon -- Pharyngeal -- Pharyngeal- Pudding Cup -- Pharyngeal -- Pharyngeal- Honey Teaspoon Reduced anterior laryngeal mobility;Delayed swallow initiation-vallecula;Delayed swallow initiation-pyriform sinuses;Pharyngeal residue - valleculae;Pharyngeal residue - pyriform;Reduced tongue base retraction Pharyngeal -- Pharyngeal- Honey Cup -- Pharyngeal -- Pharyngeal- Nectar Teaspoon -- Pharyngeal -- Pharyngeal- Nectar Cup Reduced anterior laryngeal mobility;Delayed swallow initiation-vallecula;Delayed swallow initiation-pyriform sinuses;Pharyngeal residue - valleculae;Pharyngeal residue - pyriform;Reduced tongue base retraction;Penetration/Aspiration before swallow Pharyngeal Material enters airway, passes  BELOW cords without attempt by Adam [name] to eject out (silent aspiration) Pharyngeal- Nectar Straw Reduced anterior laryngeal mobility;Delayed swallow initiation-vallecula;Delayed swallow initiation-pyriform sinuses;Pharyngeal residue - valleculae;Pharyngeal residue - pyriform;Reduced tongue base retraction Pharyngeal -- Pharyngeal- Thin Teaspoon -- Pharyngeal -- Pharyngeal- Thin Cup Reduced anterior laryngeal mobility;Delayed swallow initiation-vallecula;Delayed swallow initiation-pyriform sinuses;Pharyngeal residue - valleculae;Pharyngeal residue - pyriform;Reduced tongue base retraction;Penetration/Aspiration during swallow Pharyngeal Material enters airway, remains ABOVE vocal cords and not ejected out Pharyngeal- Thin Straw Reduced anterior laryngeal mobility;Delayed swallow initiation-vallecula;Delayed swallow initiation-pyriform sinuses;Pharyngeal residue - valleculae;Pharyngeal residue - pyriform;Reduced tongue base retraction Pharyngeal -- Pharyngeal- Puree Reduced anterior laryngeal mobility;Delayed swallow initiation-vallecula;Delayed swallow initiation-pyriform sinuses;Pharyngeal residue - valleculae;Pharyngeal residue - pyriform;Reduced tongue base retraction Pharyngeal -- Pharyngeal- Mechanical Soft -- Pharyngeal -- Pharyngeal- Regular -- Pharyngeal -- Pharyngeal- Multi-consistency -- Pharyngeal -- Pharyngeal- Pill -- Pharyngeal -- Pharyngeal Comment --  No flowsheet data found. Shanika I. Vear Clock, MS, CCC-SLP Acute Rehabilitation Services Office number [phone] Pager [phone] Scheryl Marten 08/05/2020, 10:11 AM              VAS [place] TRANSCRANIAL DOPPLER W BUBBLES  Result Date: 08/06/2020  Transcranial Doppler with Bubble Indications: Stroke. Comparison Study: No prior study Performing Technologist: Gertie Fey MHA, RDMS, RVT, RDCS  Examination Guidelines: A complete evaluation includes B-mode imaging, spectral Doppler, color Doppler, and power Doppler as needed of all accessible  portions of each vessel. Bilateral testing is considered an integral part of a complete examination. Limited examinations for reoccurring indications may be performed as noted.  Summary: No HITS at rest or during Valsalva. Negative transcranial Doppler Bubble study with no evidence of right to left intracardiac communication.  A vascular evaluation was performed. The right middle cerebral artery was studied. An IV was inserted into the Adam [name]'s right forearm. Verbal informed consent was obtained.  Negative TCD Bubble study *See table(s) above for TCD measurements and observations.  Diagnosing physician: Delia Heady [name] Electronically signed by Delia Heady [name] on 08/06/2020 at 12:32:56 PM.    Final    ECHO TEE  Result Date: 08/09/2020    TRANSESOPHOGEAL ECHO REPORT   Adam [name] Name:   HOLT WOOLBRIGHT Date of Exam: 08/09/2020 Medical Rec #:  [id number]      Height: Accession #:    [id number]     Weight: Date of Birth:  05-16-1961      BSA: Adam [name] Age:    8 years  BP:           129/96 mmHg Adam [name] Gender: M              HR:           93 bpm. Exam Location:  Inpatient Procedure: Transesophageal Echo, Color Doppler and Saline Contrast Bubble Study Indications:     Stroke  History:         Adam [name] has prior history of Echocardiogram examinations, most                  recent 08/04/2020. Risk Factors:Hypertension and Current Smoker.  Sonographer:     Renella Cunas RDCS Referring Phys:  [id number] Manson Passey Diagnosing Phys: Dietrich Pates [name] PROCEDURE: The transesophogeal probe was passed without difficulty through the esophogus of the Adam [name]. Sedation performed by different physician. The Adam [name] was monitored while under deep sedation. Anesthestetic sedation was provided intravenously by Anesthesiology: 134.  of Propofol,  of Lidocaine. The Adam [name] developed no complications during the procedure. IMPRESSIONS  1. Left ventricular ejection fraction, by estimation, is 55 to 60%. The left ventricle has normal  function.  2. Right ventricular systolic function is normal. The right ventricular size is normal.  3. No left atrial/left atrial appendage thrombus was detected.  4. The mitral valve is normal in structure. Trivial mitral valve regurgitation.  5. The aortic valve is normal in structure. Aortic valve regurgitation is trivial.  6. Minimal plaquing of the thoracic aorta (fixed). FINDINGS  Left Ventricle: Left ventricular ejection fraction, by estimation, is 55 to 60%. The left ventricle has normal function. The left ventricular internal cavity size was normal in size. Right Ventricle: The right ventricular size is normal. No increase in right ventricular wall thickness. Right ventricular systolic function is normal. Left Atrium: Left atrial size was normal in size. No left atrial/left atrial appendage thrombus was detected. Right Atrium: Right atrial size was normal in size. Pericardium: There is no evidence of pericardial effusion. Mitral Valve: The mitral valve is normal in structure. Trivial mitral valve regurgitation. Tricuspid Valve: The tricuspid valve is normal in structure. Tricuspid valve regurgitation is trivial. Aortic Valve: The aortic valve is normal in structure. Aortic valve regurgitation is trivial. Pulmonic Valve: The pulmonic valve was normal in structure. Pulmonic valve regurgitation is mild. Aorta: Minimal plaquing of the thoracic aorta (fixed). The aortic root is normal in size and structure. IAS/Shunts: No atrial level shunt detected by color flow Doppler. Agitated saline contrast was given intravenously to evaluate for intracardiac shunting. Dietrich Pates [name] Electronically signed by Dietrich Pates [name] Signature Date/Time: 08/09/2020/5:52:07 PM    Final    ECHOCARDIOGRAM COMPLETE BUBBLE STUDY  Result Date: 08/04/2020    ECHOCARDIOGRAM REPORT   Adam [name] Name:   SULEIMAN FINIGAN Date of Exam: 08/04/2020 Medical Rec #:  [id number]      Height: Accession #:    [id number]     Weight: Date of Birth:  1961/03/23       BSA: Adam [name] Age:    58 years       BP:           171/70 mmHg Adam [name] Gender: M              HR:           55 bpm. Exam Location:  Inpatient Procedure: 2D Echo Indications:    Stroke 434.91 / I63.9  History:        Adam [name] has no prior history of Echocardiogram examinations.  Stroke; Risk Factors:Hypertension and Current Smoker.  Sonographer:    Jeryl Columbia Referring Phys: [id number] CAROLE N HALL IMPRESSIONS  1. Left ventricular ejection fraction, by estimation, is 60 to 65%. The left ventricle has normal function. The left ventricle has no regional wall motion abnormalities. Left ventricular diastolic parameters were normal.  2. Right ventricular systolic function is normal. The right ventricular size is normal.  3. The mitral valve is normal in structure. Trivial mitral valve regurgitation. No evidence of mitral stenosis.  4. The aortic valve is normal in structure. Aortic valve regurgitation is not visualized. No aortic stenosis is present.  5. The inferior vena cava is normal in size with greater than 50% respiratory variability, suggesting right atrial pressure of 3 mmHg.  6. Agitated saline contrast bubble study was negative, with no evidence of any interatrial shunt. FINDINGS  Left Ventricle: Left ventricular ejection fraction, by estimation, is 60 to 65%. The left ventricle has normal function. The left ventricle has no regional wall motion abnormalities. The left ventricular internal cavity size was normal in size. There is  no left ventricular hypertrophy. Left ventricular diastolic parameters were normal. Right Ventricle: The right ventricular size is normal. No increase in right ventricular wall thickness. Right ventricular systolic function is normal. Left Atrium: Left atrial size was normal in size. Right Atrium: Right atrial size was normal in size. Pericardium: There is no evidence of pericardial effusion. Mitral Valve: The mitral valve is normal in structure. There is mild  thickening of the mitral valve leaflet(s). Trivial mitral valve regurgitation. No evidence of mitral valve stenosis. Tricuspid Valve: The tricuspid valve is normal in structure. Tricuspid valve regurgitation is not demonstrated. No evidence of tricuspid stenosis. Aortic Valve: The aortic valve is normal in structure. Aortic valve regurgitation is not visualized. No aortic stenosis is present. Pulmonic Valve: The pulmonic valve was normal in structure. Pulmonic valve regurgitation is not visualized. No evidence of pulmonic stenosis. Aorta: The aortic root is normal in size and structure. Venous: The inferior vena cava is normal in size with greater than 50% respiratory variability, suggesting right atrial pressure of 3 mmHg. IAS/Shunts: No atrial level shunt detected by color flow Doppler. Agitated saline contrast was given intravenously to evaluate for intracardiac shunting. Agitated saline contrast bubble study was negative, with no evidence of any interatrial shunt. Charlton Haws [name] Electronically signed by Charlton Haws [name] Signature Date/Time: 08/04/2020/3:38:04 PM    Final      Time Spent in minutes > 30     Laverna Peace M.D on 08/13/2020 at 1:56 PM  To page go to [email or web address] - password [facility]

## 2020-08-13 NOTE — Plan of Care (Signed)
  Problem: Education: Goal: Knowledge of General Education information will improve Description: Including pain rating scale, medication(s)/side effects and non-pharmacologic comfort measures Outcome: Progressing   Problem: Health Behavior/Discharge Planning: Goal: Ability to manage health-related needs will improve Outcome: Progressing   Problem: Clinical Measurements: Goal: Ability to maintain clinical measurements within normal limits will improve Outcome: Progressing Goal: Will remain free from infection Outcome: Progressing Goal: Diagnostic test results will improve Outcome: Progressing Goal: Respiratory complications will improve Outcome: Progressing Goal: Cardiovascular complication will be avoided Outcome: Progressing   Problem: Activity: Goal: Risk for activity intolerance will decrease Outcome: Progressing   Problem: Nutrition: Goal: Adequate nutrition will be maintained Outcome: Progressing   Problem: Coping: Goal: Level of anxiety will decrease Outcome: Progressing   Problem: Elimination: Goal: Will not experience complications related to bowel motility Outcome: Progressing Goal: Will not experience complications related to urinary retention Outcome: Progressing   Problem: Pain Managment: Goal: General experience of comfort will improve Outcome: Progressing   Problem: Safety: Goal: Ability to remain free from injury will improve Outcome: Progressing   Problem: Skin Integrity: Goal: Risk for impaired skin integrity will decrease Outcome: Progressing   Problem: Self-Care: Goal: Ability to communicate needs accurately will improve Outcome: Progressing   Problem: Nutrition: Goal: Risk of aspiration will decrease Outcome: Progressing Goal: Dietary intake will improve Outcome: Progressing   Problem: Ischemic Stroke/TIA Tissue Perfusion: Goal: Complications of ischemic stroke/TIA will be minimized Outcome: Progressing   Problem: Education: Goal:  Knowledge of disease or condition will improve Outcome: Progressing Goal: Knowledge of secondary prevention will improve Outcome: Progressing Goal: Knowledge of patient specific risk factors addressed and post discharge goals established will improve Outcome: Progressing   Problem: Self-Care: Goal: Ability to participate in self-care as condition permits will improve Outcome: Progressing   Problem: Nutrition: Goal: Risk of aspiration will decrease Outcome: Progressing Goal: Dietary intake will improve Outcome: Progressing   Problem: Ischemic Stroke/TIA Tissue Perfusion: Goal: Complications of ischemic stroke/TIA will be minimized Outcome: Progressing   

## 2020-08-13 NOTE — Progress Notes (Signed)
Occupational Therapy Treatment Patient Details Name: Adam [name] MRN: [id number] DOB: 07-Nov-1961 Today's Date: 08/13/2020    History of present illness 59 y.o. male with medical history significant for essential hypertension, prior CVA in 2013, seen in [place], who presented to [facility] ED from home with complaints of left arm numbness and drooling. MRI revealed patchy small volume acute right MCA territory infarcts involving the right frontoparietal region and multiple remote lacunar infarcts about the bilateral basal ganglia and right cerebellum.   OT comments  Patient agreeable to OT session. Sitting EOB donned shoes with supervision, engaged in writing task to list 4 step trail making task with minimal cueing.  Patient engaged in task with moderate verbal cueing, missing the 2nd task and requiring increased cueing to problem solve through task and ultimately not able to find room number before fatiguing.  Initially using written list to recall tasks, but towards end of session requires cueing to recall to use list for final task.  All mobility during session at min guard to supervision level, using IV pole.  Continue to recommend 24/7 assistance due to cognition.  Will follow acutely.    Follow Up Recommendations  Home health OT;Supervision/Assistance - 24 hour (Outpatient OT if he has transportation )    Equipment Recommendations  Tub/shower seat    Recommendations for Other Services      Precautions / Restrictions Precautions Precautions: Fall Precaution Comments: unsteady on feet, likes to hold IV pole for support, refusing to consider an assistive device for increased gait stability at home.       Mobility Bed Mobility Overal bed mobility: Needs Assistance Bed Mobility: Supine to Sit     Supine to sit: Supervision Sit to supine: Supervision   General bed mobility comments: supervision for safety  Transfers Overall transfer level: Needs assistance Equipment used:  None Transfers: Sit to/from Stand Sit to Stand: Supervision         General transfer comment: supervision for safety    Balance Overall balance assessment: Needs assistance Sitting-balance support: No upper extremity supported;Feet supported Sitting balance-Leahy Scale: Good     Standing balance support: Single extremity supported;No upper extremity supported Standing balance-Leahy Scale: Fair Standing balance comment: external support with IV pole, no losses of balance during ADLs or mobility with turns  Single Leg Stance - Right Leg: 8 (min assist much more unstable) Single Leg Stance - Left Leg: 8 (min assist) Tandem Stance - Right Leg: 30 (min guard assist) Tandem Stance - Left Leg: 30 (min guard assist) Rhomberg - Eyes Opened: 30 Rhomberg - Eyes Closed: 30 (min guard assist. )   High Level Balance Comments: stepping over obstacles x10           ADL either performed or assessed with clinical judgement   ADL Overall ADL's : Needs assistance/impaired     Grooming: Supervision/safety;Standing;Wash/dry hands               Lower Body Dressing: Supervision/safety;Sit to/from stand Lower Body Dressing Details (indicate cue type and reason): don/doff shoes without assist, supervision sit to stand  Toilet Transfer: Supervision/safety;Ambulation Toilet Transfer Details (indicate cue type and reason): IV pole, simulated in room          Functional mobility during ADLs: Supervision/safety       Vision       Perception     Praxis      Cognition Arousal/Alertness: Awake/alert Behavior During Therapy: WFL for tasks assessed/performed Overall Cognitive Status: Impaired/Different from baseline Area of Impairment:  Problem solving;Awareness;Following commands;Safety/judgement;Memory                     Memory: Decreased short-term memory Following Commands: Follows multi-step commands inconsistently;Follows one step commands consistently;Follows one  step commands with increased time Safety/Judgement: Decreased awareness of safety Awareness: Emergent Problem Solving: Requires verbal cues;Slow processing;Difficulty sequencing General Comments: pt engaged in trail making task-- see below for details   Patient engaged in 4 step trail making task (1- exit room and turn L, 2- find room 2w18, 3- find elevators and 4-return to room and wash hands).  Pt wrote list down with min cueing, initially using list to follow sequence and referring back to list without cueing but requires cueing to utilize list when fatigued.  He skipped step 2, cueing to return back to it but unable to problem solve and locate room even with mod cueing before fatigued.  Assisted patient to use list and complete final step with min cueing.          Exercises Exercises: General Lower Extremity General Exercises - Lower Extremity Hip Flexion/Marching: AROM;Both;10 reps;Standing (holding to bed rail) Toe Raises: AROM;Both;10 reps;Standing Heel Raises: AROM;Both;Standing;10 reps (with bilateral UE support, 2x15 for LE strengthening) Mini-Sqauts: AROM;Both;10 reps;Standing (squats to 90* knee flexion with bilateral UE support)   Shoulder Instructions       General Comments HR up to 132 with hallway mobility     Pertinent Vitals/ Pain       Pain Assessment: Faces Faces Pain Scale: No hurt  Home Living                                          Prior Functioning/Environment              Frequency  Min 2X/week        Progress Toward Goals  OT Goals(current goals can now be found in the care plan section)  Progress towards OT goals: Progressing toward goals  Acute Rehab OT Goals Patient Stated Goal: go home OT Goal Formulation: With patient  Plan Discharge plan remains appropriate;Frequency remains appropriate    Co-evaluation                 AM-PAC OT "6 Clicks" Daily Activity     Outcome Measure   Help from another person  eating meals?: Total (NPO) Help from another person taking care of personal grooming?: A Little Help from another person toileting, which includes using toliet, bedpan, or urinal?: A Little Help from another person bathing (including washing, rinsing, drying)?: A Little Help from another person to put on and taking off regular upper body clothing?: A Little Help from another person to put on and taking off regular lower body clothing?: A Little 6 Click Score: 16    End of Session    OT Visit Diagnosis: Unsteadiness on feet (R26.81);Other abnormalities of gait and mobility (R26.89);Muscle weakness (generalized) (M62.81);Other symptoms and signs involving cognitive function   Activity Tolerance Patient tolerated treatment well   Patient Left in bed;with call bell/phone within reach   Nurse Communication Mobility status        Time: 1342-1405 OT Time Calculation (min): 23 min  Charges: OT General Charges $OT Visit: 1 Visit OT Treatments $Self Care/Home Management : 8-22 mins $Therapeutic Activity: 8-22 mins  Barry Brunner, OT Acute Rehabilitation Services Pager [phone] Office [phone]    Lorene Dy  S Gretta Samons 08/13/2020, 2:29 PM

## 2020-08-13 NOTE — Progress Notes (Signed)
Physical Therapy Treatment Patient Details Name: Adam [name] MRN: [id number] DOB: 1961/02/26 Today's Date: 08/13/2020    History of Present Illness 59 y.o. male with medical history significant for essential hypertension, prior CVA in 2013, seen in [place], who presented to [facility] ED from home with complaints of left arm numbness and drooling. MRI revealed patchy small volume acute right MCA territory infarcts involving the right frontoparietal region and multiple remote lacunar infarcts about the bilateral basal ganglia and right cerebellum.    PT Comments    Pt holding to IV pole during gait, need to ensure we are practicing gait without an AD as pt is refusing to try (even temporarily) a walker or a cane for increased stability.  Standing exercises and balance poses practiced in standing at the end of gait.  Gait speed slows significantly as he fatigues.  Continue to challenge dynamic balance especially during gait.  PT will continue to follow acutely for safe mobility progression.   Follow Up Recommendations  Supervision for mobility/OOB;Outpatient PT     Equipment Recommendations  Other (comment) (refusing gait assistive device)    Recommendations for Other Services       Precautions / Restrictions Precautions Precautions: Fall Precaution Comments: unsteady on feet, likes to hold IV pole for support, refusing to consider an assistive device for increased gait stability at home.    Mobility  Bed Mobility Overal bed mobility: Needs Assistance Bed Mobility: Supine to Sit     Supine to sit: Supervision     General bed mobility comments: supervision for safety  Transfers Overall transfer level: Needs assistance Equipment used: None Transfers: Sit to/from Stand Sit to Stand: Supervision         General transfer comment: supervision for safety, lower legs supported on bed for balance/stability.   Ambulation/Gait Ambulation/Gait assistance: Min guard Gait Distance  (Feet): 300 Feet Assistive device: IV Pole Gait Pattern/deviations: Step-through pattern;Decreased stride length;Shuffle;Narrow base of support;Decreased dorsiflexion - right Gait velocity: decreased significantly as pt fatigued at the end of our walk.    General Gait Details: min guard assist for safety and balance, used IV pole in R hand for stability, however, did not want to try an assitive device (will need to start practicing without IV pole consistently.  Pt with narrow base, cues to widen stance.  Pt reports L leg is his weakest side, however, R leg seems to be weaker functionally and during exercises.    Stairs             Wheelchair Mobility    Modified Rankin (Stroke Patients Only) Modified Rankin (Stroke Patients Only) Pre-Morbid Rankin Score: Slight disability Modified Rankin: Moderately severe disability     Balance Overall balance assessment: Needs assistance Sitting-balance support: No upper extremity supported;Feet supported Sitting balance-Leahy Scale: Good     Standing balance support: Single extremity supported Standing balance-Leahy Scale: Poor Standing balance comment: needs external support from rail most of the time.   Single Leg Stance - Right Leg: 8 (min assist much more unstable) Single Leg Stance - Left Leg: 8 (min assist) Tandem Stance - Right Leg: 30 (min guard assist) Tandem Stance - Left Leg: 30 (min guard assist) Rhomberg - Eyes Opened: 30 Rhomberg - Eyes Closed: 30 (min guard assist. )   High Level Balance Comments: stepping over obstacles x10            Cognition Arousal/Alertness: Awake/alert Behavior During Therapy: WFL for tasks assessed/performed Overall Cognitive Status: Impaired/Different from baseline Area of Impairment: Problem  solving;Awareness;Following commands;Safety/judgement                       Following Commands: Follows multi-step commands with increased time Safety/Judgement: Decreased awareness of  safety Awareness: Emergent Problem Solving: Requires verbal cues;Slow processing General Comments: Slow processing, decreased awareness of safety (refused assistive device for home despite holding to IV pole during gait for balance).  Does best with yes/no questions, although did attempt one word answers twice with success (garbled, but I got the word he was saying).       Exercises General Exercises - Lower Extremity Hip Flexion/Marching: AROM;Both;10 reps;Standing (holding to bed rail) Toe Raises: AROM;Both;10 reps;Standing Heel Raises: AROM;Both;Standing;10 reps (with bilateral UE support, 2x15 for LE strengthening) Mini-Sqauts: AROM;Both;10 reps;Standing (squats to 90* knee flexion with bilateral UE support)    General Comments General comments (skin integrity, edema, etc.): HR is 127 upon first standing.  Down to 100 after gait sitting in the chair.       Pertinent Vitals/Pain Pain Assessment: Faces Faces Pain Scale: No hurt    Home Living                      Prior Function            PT Goals (current goals can now be found in the care plan section) Acute Rehab PT Goals Patient Stated Goal: go home Progress towards PT goals: Progressing toward goals    Frequency    Min 4X/week      PT Plan Current plan remains appropriate    Co-evaluation              AM-PAC PT "6 Clicks" Mobility   Outcome Measure  Help needed turning from your back to your side while in a flat bed without using bedrails?: None Help needed moving from lying on your back to sitting on the side of a flat bed without using bedrails?: None Help needed moving to and from a bed to a chair (including a wheelchair)?: A Little Help needed standing up from a chair using your arms (e.g., wheelchair or bedside chair)?: None Help needed to walk in hospital room?: A Little Help needed climbing 3-5 steps with a railing? : A Little 6 Click Score: 21    End of Session   Activity  Tolerance: Patient limited by fatigue Patient left: with call bell/phone within reach;in chair;Other (comment) (per RN tech he walks by himself to the bathroom, no alarm )   PT Visit Diagnosis: Unsteadiness on feet (R26.81);Difficulty in walking, not elsewhere classified (R26.2)     Time: 0350-0938 PT Time Calculation (min) (ACUTE ONLY): 21 min  Charges:  $Gait Training: 8-22 mins                     Corinna Capra, PT, DPT  Acute Rehabilitation [phone] pager [phone]) [phone] office

## 2020-08-13 NOTE — Plan of Care (Signed)
°  Problem: Education: Goal: Knowledge of General Education information will improve Description: Including pain rating scale, medication(s)/side effects and non-pharmacologic comfort measures Outcome: Progressing   Problem: Health Behavior/Discharge Planning: Goal: Ability to manage health-related needs will improve Outcome: Progressing   Problem: Clinical Measurements: Goal: Ability to maintain clinical measurements within normal limits will improve Outcome: Progressing Goal: Will remain free from infection Outcome: Progressing Goal: Diagnostic test results will improve Outcome: Progressing Goal: Respiratory complications will improve Outcome: Progressing Goal: Cardiovascular complication will be avoided Outcome: Progressing   Problem: Activity: Goal: Risk for activity intolerance will decrease Outcome: Progressing   Problem: Nutrition: Goal: Adequate nutrition will be maintained Outcome: Progressing   Problem: Coping: Goal: Level of anxiety will decrease Outcome: Progressing   Problem: Elimination: Goal: Will not experience complications related to bowel motility Outcome: Progressing Goal: Will not experience complications related to urinary retention Outcome: Progressing   Problem: Pain Managment: Goal: General experience of comfort will improve Outcome: Progressing   Problem: Safety: Goal: Ability to remain free from injury will improve Outcome: Progressing   Problem: Skin Integrity: Goal: Risk for impaired skin integrity will decrease Outcome: Progressing   Problem: Self-Care: Goal: Ability to communicate needs accurately will improve Outcome: Progressing   Problem: Nutrition: Goal: Risk of aspiration will decrease Outcome: Progressing Goal: Dietary intake will improve Outcome: Progressing   Problem: Ischemic Stroke/TIA Tissue Perfusion: Goal: Complications of ischemic stroke/TIA will be minimized Outcome: Progressing   Problem: Education: Goal:  Knowledge of disease or condition will improve Outcome: Progressing Goal: Knowledge of secondary prevention will improve Outcome: Progressing Goal: Knowledge of patient specific risk factors addressed and post discharge goals established will improve Outcome: Progressing   Problem: Self-Care: Goal: Ability to participate in self-care as condition permits will improve Outcome: Progressing   Problem: Nutrition: Goal: Risk of aspiration will decrease Outcome: Progressing Goal: Dietary intake will improve Outcome: Progressing   Problem: Ischemic Stroke/TIA Tissue Perfusion: Goal: Complications of ischemic stroke/TIA will be minimized Outcome: Progressing   

## 2020-08-13 NOTE — Progress Notes (Signed)
STROKE TEAM PROGRESS NOTE   INTERVAL HISTORY Patient lying in bed, still has difficulty swallowing with drooling.  On tube feeding.  Core track.  Plan for PEG tube soon.  Currently Plavix on hold.   OBJECTIVE Vitals:   08/12/20 1656 08/12/20 1939 08/13/20 0500 08/13/20 0729  BP: 96/81 103/85  (!) 122/94  Pulse: (!) 105 (!) 103  (!) 107  Resp: 17 18  19   Temp: 98.6 F (37 C) 98.6 F (37 C)  98.7 F (37.1 C)  TempSrc:  Oral    SpO2: 98%   100%  Weight:   53.9 kg    CBC:  No results for input(s): WBC, NEUTROABS, HGB, HCT, MCV, PLT in the last 168 hours.  Basic Metabolic Panel:  Recent Labs  Lab 08/09/20 0101 08/09/20 1428 08/10/20 0443 08/10/20 1614  NA 140  --  141  --   K 3.8  --  3.5  --   CL 106  --  106  --   CO2 22  --  23  --   GLUCOSE 92  --  98  --   BUN 52*  --  48*  --   CREATININE 1.31*  --  1.06  --   CALCIUM 9.9  --  9.5  --   MG  --    < > 2.4 2.5*  PHOS  --    < > 3.0 2.8   < > = values in this interval not displayed.   Lipid Panel:     Component Value Date/Time   CHOL 123 08/04/2020 1142   TRIG 49 08/04/2020 1142   HDL 33 (L) 08/04/2020 1142   CHOLHDL 3.7 08/04/2020 1142   VLDL 10 08/04/2020 1142   LDLCALC 80 08/04/2020 1142   HgbA1c:  Lab Results  Component Value Date   HGBA1C 5.7 (H) 08/06/2020   Urine Drug Screen:     Component Value Date/Time   LABOPIA NONE DETECTED 08/11/2020 1704   COCAINSCRNUR NONE DETECTED 08/11/2020 1704   LABBENZ NONE DETECTED 08/11/2020 1704   AMPHETMU NONE DETECTED 08/11/2020 1704   THCU POSITIVE (A) 08/11/2020 1704   LABBARB NONE DETECTED 08/11/2020 1704    Alcohol Level     Component Value Date/Time   ETH <10 08/03/2020 2354    IMAGING CT HEAD 08/03/2020 1. Subcentimeter age-indeterminate infarct within the right cerebellum.  2. Multiple chronic appearing lacunar infarcts within the bilateral basal ganglia.  3. Background mild generalized parenchymal atrophy with advanced chronic small vessel  ischemic disease.   CT ANGIO NECK W OR WO CONTRAST 08/03/2020 The common carotid, internal carotid and vertebral arteries are patent within the neck without hemodynamically significant stenosis.   CT Angio Head W or Wo Contrast 08/03/2020 1. No definite proximal right M2 branch occlusion is identified. However, there is a paucity of visualized proximal M2 superior division right MCA branch vessels. If there is concern for an acute right MCA territory stroke, consider catheter based angiography for further evaluation.  2. Intracranial atherosclerotic disease with multifocal stenoses, most notably as follows.  3. Moderate stenosis within the distal M1 right middle cerebral artery.  4. High-grade focal stenosis within the distal A2/proximal A3 left anterior cerebral artery.  5. High-grade focal stenosis within the P2 left PCA.  6. High-grade stenoses within the P4 right PCA.   MR Brain W and Wo Contrast 08/04/2020 IMPRESSION:  1. Patchy small volume acute right MCA territory infarcts involving the right frontoparietal region as above. No associated hemorrhage  or mass effect.  2. Underlying advanced cerebral atrophy and chronic microvascular ischemic disease for age, with multiple remote lacunar infarcts about the bilateral basal ganglia and right cerebellum.   Transthoracic Echocardiogram  Normal ejection fraction 60 to 65%.  No cardiac source of embolism  ECG - SR rate 82 BPM. (See cardiology reading for complete details)  CT ABDOMEN WO CONTRAST 08/12/2020 Anatomy is amendable for a percutaneous gastrostomy tube placement. Aortic Atherosclerosis     PHYSICAL EXAM  Temp:  [98.6 F (37 C)-98.7 F (37.1 C)] 98.7 F (37.1 C) (09/21 0729) Pulse Rate:  [103-107] 107 (09/21 0729) Resp:  [17-19] 19 (09/21 0729) BP: (96-122)/(81-94) 122/94 (09/21 0729) SpO2:  [98 %-100 %] 100 % (09/21 0729) Weight:  [53.9 kg] 53.9 kg (09/21 0500)  General - Well nourished, well developed, in no apparent  distress.  Ophthalmologic - fundi not visualized due to noncooperation.  Cardiovascular - Regular rhythm and rate.  Mental Status -  Level of arousal and orientation to time, place, and person were intact. Language including expression, naming, repetition, comprehension was assessed and found intact. However, hypophonia, and moderate dysarthria  Fund of Knowledge was assessed and was intact.  Cranial Nerves II - XII - II - Visual field intact OU. III, IV, VI - Extraocular movements intact. V - Facial sensation intact bilaterally. VII - bilateral lower facial weakness, right more than left. VIII - Hearing & vestibular intact bilaterally. X - Palate elevation not able to see. Moderate dysarthria with hypophonia XI - Chin turning & shoulder shrug intact bilaterally. XII - Tongue protrusion incomplete.  Motor Strength - The patient's strength was normal in all extremities and pronator drift was absent.  Bulk was normal and fasciculations were absent.   Motor Tone - Muscle tone was assessed at the neck and appendages and was normal.  Reflexes - The patient's reflexes were symmetrical in all extremities and he had no pathological reflexes.  Sensory - Light touch, temperature/pinprick were assessed and were symmetrical.    Coordination - The patient had normal movements in the hands with no ataxia or dysmetria.  Tremor was absent.  Gait and Station - deferred.   ASSESSMENT/PLAN [name] is a 59 y.o. male with history of hypertension, prior stroke (residual right-sided weakness and dysarthria), active smoking (reports 1 cigarette/day) presenting with acute onset drooling, patchy left arm and torso sensory changes, headache, cough, bifacial weakness, significantly worsened speech and gait with recent weight loss. He did not receive IV t-PA due to late presentation (>4.5 hours from time of onset).   Stroke: acute right MCA patchy infarcts, embolic vs. Large vessel disease   CT  head - age-indeterminate infarct within the right cerebellum. Multiple chronic appearing lacunar infarcts within the bilateral basal ganglia.   MRI head - Patchy small volume acute right MCA territory infarcts involving the right frontoparietal region as above. multiple remote lacunar infarcts at bilateral basal ganglia and right cerebellum.   CTA Head and neck- right M1/M2, left A2/A3, left P2 and right P4 stenosis.  2D Echo - EF 60 to 65%.    Sars Corona Virus 2 - negative  LDL - 80  HgbA1c - 5.7   UDS - positive for THC  Hypercoagulable labs unremarkable so far, cardiolipin Ab pending  VTE prophylaxis - Lovenox  No antithrombotic prior to admission, was on aspirin 81 mg daily and Plavix 75 DAPT.  Okay to hold off Plavix for 5 days for potential PEG placement.  May consider resume DAPT  for total 3 weeks once hemodynamically stable after procedure.  Patient counseled to be compliant with his antithrombotic medications  Ongoing aggressive stroke risk factor management  Therapy recommendations:  HH OT, OP PT, supervision for mobility  Disposition:  Pending  Dysphagia  Did not pass a swallow  Significant drooling and hypophonia  Has core track placed  On tube feeding  Speech on board  Likely need PEG tube, IR consulted  CT Abd cleared for PEG  Hypertension  Home BP meds: none  Current BP meds: Amlodipine 5, lisinopril 10, HCTZ 25  Stable  Avoid low BP . 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 Please call with questions. Pt will follow up with stroke clinic NP at [facility] in about 4 weeks. Thanks for the consult.    Marvel Plan, MD PhD Stroke Neurology 08/13/2020 12:06 PM     To contact Stroke Continuity provider, please refer to [email or web address]. After hours, contact General Neurology

## 2020-08-13 NOTE — Consult Note (Signed)
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 Pharyngeal- Thin Straw Reduced anterior laryngeal mobility;Delayed swallow initiation-vallecula;Delayed swallow initiation-pyriform sinuses;Pharyngeal residue - valleculae;Pharyngeal residue - pyriform;Reduced tongue base retraction Pharyngeal -- Pharyngeal- Puree Reduced anterior laryngeal mobility;Delayed swallow initiation-vallecula;Delayed swallow initiation-pyriform sinuses;Pharyngeal residue - valleculae;Pharyngeal residue - pyriform;Reduced tongue base retraction Pharyngeal -- Pharyngeal- Mechanical Soft -- Pharyngeal -- Pharyngeal- Regular -- Pharyngeal -- Pharyngeal- Multi-consistency -- Pharyngeal -- Pharyngeal- Pill -- Pharyngeal -- Pharyngeal Comment --  No flowsheet data found. Shanika I. Vear ClockPhillips, MS, CCC-SLP Acute Rehabilitation Services Office number [phone] Pager [phone] Scheryl MartenShanika I Phillips 08/05/2020, 10:11 AM              VAS US TRANSCRANIAL DOPPLER W BUBBLES  Result Date: 08/06/2020  Transcranial Doppler with Bubble Indications: Stroke. Comparison Study: No prior study Performing Technologist: Gertie FeyMichelle Simonetti MHA, RDMS, RVT, RDCS  Examination Guidelines: A complete evaluation  includes B-mode imaging, spectral Doppler, color Doppler, and power Doppler as needed of all accessible portions of each vessel. Bilateral testing is  considered an integral part of a complete examination. Limited examinations for reoccurring indications may be performed as noted.  Summary: No HITS at rest or during Valsalva. Negative transcranial Doppler Bubble study with no evidence of right to left intracardiac communication.  A vascular evaluation was performed. The right middle cerebral artery was studied. An IV was inserted into the patient's right forearm. Verbal informed consent was obtained.  Negative TCD Bubble study *See table(s) above for TCD measurements and observations.  Diagnosing physician: Delia Heady MD Electronically signed by Delia Heady MD on 08/06/2020 at 12:32:56 PM.    Final    ECHO TEE  Result Date: 08/09/2020    TRANSESOPHOGEAL ECHO REPORT   Patient Name:   Adam [name] Date of Exam: 08/09/2020 Medical Rec #:  [id number]      Height: Accession #:    [id number]     Weight: Date of Birth:  08/25/1961      BSA: Patient Age:    58 years       BP:           129/96 mmHg Patient Gender: M              HR:           93 bpm. Exam Location:  Inpatient Procedure: Transesophageal Echo, Color Doppler and Saline Contrast Bubble Study Indications:     Stroke  History:         Patient has prior history of Echocardiogram examinations, most                  recent 08/04/2020. Risk Factors:Hypertension and Current Smoker.  Sonographer:     Renella Cunas RDCS Referring Phys:  [id number] Manson Passey Diagnosing Phys: Dietrich Pates MD PROCEDURE: The transesophogeal probe was passed without difficulty through the esophogus of the patient. Sedation performed by different physician. The patient was monitored while under deep sedation. Anesthestetic sedation was provided intravenously by Anesthesiology: 134.75mg  of Propofol, 40mg  of Lidocaine. The patient developed no complications during the procedure. IMPRESSIONS  1. Left ventricular ejection fraction, by estimation, is 55 to 60%. The left ventricle has normal function.  2. Right ventricular systolic function  is normal. The right ventricular size is normal.  3. No left atrial/left atrial appendage thrombus was detected.  4. The mitral valve is normal in structure. Trivial mitral valve regurgitation.  5. The aortic valve is normal in structure. Aortic valve regurgitation is trivial.  6. Minimal plaquing of the thoracic aorta (fixed). FINDINGS  Left Ventricle: Left ventricular ejection fraction, by estimation, is 55 to 60%. The left ventricle has normal function. The left ventricular internal cavity size was normal in size. Right Ventricle: The right ventricular size is normal. No increase in right ventricular wall thickness. Right ventricular systolic function is normal. Left Atrium: Left atrial size was normal in size. No left atrial/left atrial appendage thrombus was detected. Right Atrium: Right atrial size was normal in size. Pericardium: There is no evidence of pericardial effusion. Mitral Valve: The mitral valve is normal in structure. Trivial mitral valve regurgitation. Tricuspid Valve: The tricuspid valve is normal in structure. Tricuspid valve regurgitation is trivial. Aortic Valve: The aortic valve is normal in structure. Aortic valve regurgitation is trivial. Pulmonic Valve: The pulmonic valve was normal in structure. Pulmonic valve regurgitation is mild. Aorta: Minimal plaquing of the thoracic aorta (fixed). The aortic  root is normal in size and structure. IAS/Shunts: No atrial level shunt detected by color flow Doppler. Agitated saline contrast was given intravenously to evaluate for intracardiac shunting. Dietrich Pates MD Electronically signed by Dietrich Pates MD Signature Date/Time: 08/09/2020/5:52:07 PM    Final    ECHOCARDIOGRAM COMPLETE BUBBLE STUDY  Result Date: 08/04/2020    ECHOCARDIOGRAM REPORT   Patient Name:   Adam [name] Date of Exam: 08/04/2020 Medical Rec #:  [id number]      Height: Accession #:    [id number]     Weight: Date of Birth:  Nov 06, 1961      BSA: Patient Age:    58 years       BP:            171/70 mmHg Patient Gender: M              HR:           55 bpm. Exam Location:  Inpatient Procedure: 2D Echo Indications:    Stroke 434.91 / I63.9  History:        Patient has no prior history of Echocardiogram examinations.                 Stroke; Risk Factors:Hypertension and Current Smoker.  Sonographer:    Jeryl Columbia Referring Phys: [id number] CAROLE N HALL IMPRESSIONS  1. Left ventricular ejection fraction, by estimation, is 60 to 65%. The left ventricle has normal function. The left ventricle has no regional wall motion abnormalities. Left ventricular diastolic parameters were normal.  2. Right ventricular systolic function is normal. The right ventricular size is normal.  3. The mitral valve is normal in structure. Trivial mitral valve regurgitation. No evidence of mitral stenosis.  4. The aortic valve is normal in structure. Aortic valve regurgitation is not visualized. No aortic stenosis is present.  5. The inferior vena cava is normal in size with greater than 50% respiratory variability, suggesting right atrial pressure of 3 mmHg.  6. Agitated saline contrast bubble study was negative, with no evidence of any interatrial shunt. FINDINGS  Left Ventricle: Left ventricular ejection fraction, by estimation, is 60 to 65%. The left ventricle has normal function. The left ventricle has no regional wall motion abnormalities. The left ventricular internal cavity size was normal in size. There is  no left ventricular hypertrophy. Left ventricular diastolic parameters were normal. Right Ventricle: The right ventricular size is normal. No increase in right ventricular wall thickness. Right ventricular systolic function is normal. Left Atrium: Left atrial size was normal in size. Right Atrium: Right atrial size was normal in size. Pericardium: There is no evidence of pericardial effusion. Mitral Valve: The mitral valve is normal in structure. There is mild thickening of the mitral valve leaflet(s). Trivial  mitral valve regurgitation. No evidence of mitral valve stenosis. Tricuspid Valve: The tricuspid valve is normal in structure. Tricuspid valve regurgitation is not demonstrated. No evidence of tricuspid stenosis. Aortic Valve: The aortic valve is normal in structure. Aortic valve regurgitation is not visualized. No aortic stenosis is present. Pulmonic Valve: The pulmonic valve was normal in structure. Pulmonic valve regurgitation is not visualized. No evidence of pulmonic stenosis. Aorta: The aortic root is normal in size and structure. Venous: The inferior vena cava is normal in size with greater than 50% respiratory variability, suggesting right atrial pressure of 3 mmHg. IAS/Shunts: No atrial level shunt detected by color flow Doppler. Agitated saline contrast was given intravenously to evaluate for intracardiac shunting. Agitated saline contrast bubble study  was negative, with no evidence of any interatrial shunt. Charlton Haws MD Electronically signed by Charlton Haws MD Signature Date/Time: 08/04/2020/3:38:04 PM    Final     Labs:  CBC: Recent Labs    08/03/20 1427 08/03/20 1438  WBC 5.2  --   HGB 13.6 13.9  HCT 41.0 41.0  PLT 207  --     COAGS: Recent Labs    08/03/20 1427  INR 1.1  APTT 32    BMP: Recent Labs    08/07/20 1101 08/08/20 0135 08/09/20 0101 08/10/20 0443  NA 144 141 140 141  K 3.4* 3.3* 3.8 3.5  CL 107 104 106 106  CO2 GLUCOSE 105* 93 92 98  BUN 39* 45* 52* 48*  CALCIUM 10.4* 9.8 9.9 9.5  CREATININE 1.30* 1.27* 1.31* 1.06  GFRNONAA 60* >60 59* >60  GFRAA >60 >60 >60 >60    LIVER FUNCTION TESTS: Recent Labs    08/03/20 1427  BILITOT 0.8  AST 16  ALT 11  ALKPHOS 76  PROT 7.7  ALBUMIN 4.4    TUMOR MARKERS: No results for input(s): AFPTM, CEA, CA199, CHROMGRNA in the last 8760 hours.  Assessment and Plan:  Previous CVA and new CVA 08/03/20 Dysarthria Confusion Dysphagia Deconditioning and need for long term care Scheduled for  percutaneous gastric tube placement LD Plavix 9/19-- G tube scheduled in IR 9/24 Risks and benefits image guided gastrostomy tube placement was discussed with the patient's sister [name] via phone including, but not limited to the need for a barium enema during the procedure, bleeding, infection, peritonitis and/or damage to adjacent structures.  All questions were answered, she is agreeable to proceed. Consent signed and in chart.   Thank you for this interesting consult.  I greatly enjoyed meeting [address] and look forward to participating in their care.  A copy of this report was sent to the requesting provider on this date.  Electronically Signed: Robet Leu, PA-C 08/13/2020, 11:16 AM   I spent a total of 40 Minutes    in face to face in clinical consultation, greater than 50% of which was counseling/coordinating care for perc G tube placement

## 2020-08-14 ENCOUNTER — Encounter (HOSPITAL_COMMUNITY): Payer: Self-pay | Admitting: Internal Medicine

## 2020-08-14 ENCOUNTER — Other Ambulatory Visit: Payer: Self-pay

## 2020-08-14 LAB — GLUCOSE, CAPILLARY
Glucose-Capillary: 106 mg/dL — ABNORMAL HIGH (ref 70–99)
Glucose-Capillary: 106 mg/dL — ABNORMAL HIGH (ref 70–99)
Glucose-Capillary: 106 mg/dL — ABNORMAL HIGH (ref 70–99)
Glucose-Capillary: 112 mg/dL — ABNORMAL HIGH (ref 70–99)
Glucose-Capillary: 114 mg/dL — ABNORMAL HIGH (ref 70–99)
Glucose-Capillary: 124 mg/dL — ABNORMAL HIGH (ref 70–99)
Glucose-Capillary: 137 mg/dL — ABNORMAL HIGH (ref 70–99)

## 2020-08-14 LAB — CBC
HCT: 43.1 % (ref 39.0–52.0)
Hemoglobin: 14.2 g/dL (ref 13.0–17.0)
MCH: 24.6 pg — ABNORMAL LOW (ref 26.0–34.0)
MCHC: 32.9 g/dL (ref 30.0–36.0)
MCV: 74.6 fL — ABNORMAL LOW (ref 80.0–100.0)
Platelets: 182 10*3/uL (ref 150–400)
RBC: 5.78 MIL/uL (ref 4.22–5.81)
RDW: 14.7 % (ref 11.5–15.5)
WBC: 7.1 10*3/uL (ref 4.0–10.5)
nRBC: 0 % (ref 0.0–0.2)

## 2020-08-14 LAB — PROTIME-INR
INR: 1 (ref 0.8–1.2)
Prothrombin Time: 12.6 seconds (ref 11.4–15.2)

## 2020-08-14 MED ORDER — ASPIRIN 81 MG PO CHEW
81.0000 mg | CHEWABLE_TABLET | Freq: Every day | ORAL | Status: DC
  Administered 2020-08-15 – 2020-08-21 (×7): 81 mg
  Filled 2020-08-14 (×7): qty 1

## 2020-08-14 MED ORDER — ASPIRIN 300 MG RE SUPP
300.0000 mg | Freq: Every day | RECTAL | Status: DC
  Filled 2020-08-14: qty 1

## 2020-08-14 MED ORDER — ASPIRIN 81 MG PO CHEW
81.0000 mg | CHEWABLE_TABLET | Freq: Every day | ORAL | Status: DC
  Administered 2020-08-14: 81 mg via ORAL
  Filled 2020-08-14: qty 1

## 2020-08-14 MED ORDER — ASPIRIN 300 MG RE SUPP
300.0000 mg | Freq: Every day | RECTAL | Status: DC
  Filled 2020-08-14 (×7): qty 1

## 2020-08-14 NOTE — Progress Notes (Signed)
PROGRESS NOTE  Adam [name]  DOB: March 12, 1961  PCP: Patient, [name] [facility]:[id number]  DOA: 08/03/2020  LOS: 10 days   Chief Complaint  Patient presents with  . Cerebrovascular Accident    Brief narrative: Lydell Moga is a 59 y.o. year old male with medical history significant for HTN, prior stroke (residual right-sided weakness and dysarthria) who presented on 08/03/2020 with acute onset drooling, left arm/torso sensory changes, headache, bifacial weakness and worsening speech and gait. He was found to have acute right MCA territory infarcts.CTA of head showed high-grade focal distal stenosis within the distal A2/proximal A2 left anterior cerebral artery with high-grade focal stenosis in the P2 left PCA high-grade stenosis within the P4 right PCA.  CTA neck showed no significant stenosis.  TTE showed preserved EF.  LDL 80, A1c 5.7  Hospital course complicated by persistent drooling and decreased p.o. intake requiring NG tube placement for tube feeds.   Now awaiting Plavix washout in order to have IR placed PEG tube on Friday 9/24  Subjective: Patient was seen and examined this morning.  Pleasant young thin built African-American male. Sitting up at the edge of the bed.  Not in distress. No new complaint.  Assessment/Plan: Acute right MCA territory infarcts, embolic event unclear source currently.   -TEE with preserved EF, no masses, no PFO -Does have some left-sided weakness but able to mobilize well and does not currently need CIR. at discharge home we will set up for home health PT -Per neurology, continue aspirin, Plavix x3 weeks followed by aspirin alone per neurology -Currently Plavix is on hold for 5 days with the plan of PEG tube placement on 9/24 by IR.  Continue aspirin only for now. -continue high intensity level statin  Increased drooling and dysphagia/hypophonia, unclear if this is residual deficit of recent stroke/apraxia or pseudobulbar state related to  dysphagia.   -Currently n.p.o. receiving nutrition through core track -Patient agreeable for IR placed feeding tube -Continue intensive speech therapy while inpatient  B12 deficiency.  The setting of severe calorie protein malnutrition B12 90 -continue B12, thiamine, multivitamin supplementation  Hypertension -Currently blood pressure is in goal with lisinopril, HCTZ and amlodipine.  Continue to monitor. -TSH normal.  AKI, resolved Baseline creatinine 0.9-1.  Peak creatinine 1.3, now back to baseline.  Like related to diminished oral intake.  Seems to be tolerating low-dose lisinopril well -Monitor BMP -Avoid nephrotoxins Recent Labs    08/03/20 1427 08/03/20 1438 08/07/20 1101 08/08/20 0135 08/09/20 0101 08/10/20 0443  BUN 29* 30* 39* 45* 52* 48*  CREATININE 1.10 0.90 1.30* 1.27* 1.31* 1.06   Hypokalemia, mild, resolved.  -Repeat labs tomorrow  Severe malnutrition.  Has fair muscle depletion on examination -Given persistent drooling and difficulty swallowing even a liquid diet now on tube feeds via cor track  -Appreciate speech and nutrition consultations  Mobility: Encourage ambulation Code Status:   Code Status: Full Code  Nutritional status: There is no height or weight on file to calculate BMI. Nutrition Problem: Severe Malnutrition Etiology: social / environmental circumstances Signs/Symptoms: severe muscle depletion, severe fat depletion, energy intake < or equal to 50% for > or equal to 1 month Diet Order            Diet NPO time specified  Diet effective now                 DVT prophylaxis: enoxaparin (LOVENOX) injection 40 mg Start: 08/04/20 1000   Antimicrobials:  None Fluid: None Consultants: IR, neurology Family  Communication:  None at bedside  Status is: Inpatient  Remains inpatient appropriate because -pending PEG tube placement 9/24. Dispo:  Patient From: Home  Planned Disposition: Home  Expected discharge date: 08/19/20  Medically  stable for discharge: No   Infusions:  . feeding supplement (OSMOLITE 1.2 CAL) 65 mL/hr at 08/13/20 2359    Scheduled Meds: . amLODipine  5 mg Per Tube Daily  . [START ON 08/15/2020] aspirin  81 mg Per Tube Daily   Or  . [START ON 08/15/2020] aspirin  300 mg Rectal Daily  . atorvastatin  80 mg Per Tube Daily  . enoxaparin (LOVENOX) injection  40 mg Subcutaneous Daily  . feeding supplement (PROSource TF)  45 mL Per Tube Daily  . hydrochlorothiazide  25 mg Per Tube Daily  . lisinopril  10 mg Per Tube Daily  . multivitamin  15 mL Per Tube Daily  . thiamine  100 mg Per Tube Daily  . vitamin B-12  1,000 mcg Per Tube Daily    Antimicrobials: Anti-infectives (From admission, onward)   None      PRN meds: hydrALAZINE, Resource ThickenUp Clear, traMADol   Objective: Vitals:   08/14/20 0749 08/14/20 1643  BP: (!) 118/98 111/80  Pulse: (!) 109 (!) 102  Resp: 19 19  Temp: 98.7 F (37.1 C) 98.7 F (37.1 C)  SpO2: 98% 97%    Intake/Output Summary (Last 24 hours) at 08/14/2020 1745 Last data filed at 08/13/2020 2359 Gross per 24 hour  Intake 648.92 ml  Output --  Net 648.92 ml   Filed Weights   08/13/20 0500 08/14/20 0612  Weight: 53.9 kg 57.7 kg   Weight change: 3.8 kg There is no height or weight on file to calculate BMI.   Physical Exam: General exam: Appears calm and comfortable.  Not in physical distress.  Thin built Skin: No rashes, lesions or ulcers. HEENT: Atraumatic, normocephalic, supple neck, no obvious bleeding.  Feeding tube on Lungs: Clear to auscultation bilaterally CVS: Regular rate and rhythm, no murmur GI/Abd soft, nontender, nondistended, bowel sound present CNS: Alert, awake Psychiatry: Mood appropriate Extremities: No calf tenderness, no pedal edema  Data Review: I have personally reviewed the laboratory data and studies available.  Recent Labs  Lab 08/14/20 0224  WBC 7.1  HGB 14.2  HCT 43.1  MCV 74.6*  PLT 182   Recent Labs  Lab  08/08/20 0135 08/08/20 0927 08/09/20 0101 08/09/20 1428 08/09/20 1652 08/10/20 0443 08/10/20 1614  NA 141  --  140  --   --  141  --   K 3.3*  --  3.8  --   --  3.5  --   CL 104  --  106  --   --  106  --   CO2 23  --  22  --   --  23  --   GLUCOSE 93  --  92  --   --  98  --   BUN 45*  --  52*  --   --  48*  --   CREATININE 1.27*  --  1.31*  --   --  1.06  --   CALCIUM 9.8  --  9.9  --   --  9.5  --   MG  --  2.3  --  2.3 2.3 2.4 2.5*  PHOS  --   --   --  3.3 3.2 3.0 2.8    F/u labs ordered  Signed, Lorin Glass, MD [facility] 08/14/2020

## 2020-08-14 NOTE — Plan of Care (Signed)
  Problem: Education: Goal: Knowledge of General Education information will improve Description: Including pain rating scale, medication(s)/side effects and non-pharmacologic comfort measures Outcome: Progressing   Problem: Health Behavior/Discharge Planning: Goal: Ability to manage health-related needs will improve Outcome: Progressing   Problem: Clinical Measurements: Goal: Ability to maintain clinical measurements within normal limits will improve Outcome: Progressing Goal: Will remain free from infection Outcome: Progressing Goal: Diagnostic test results will improve Outcome: Progressing Goal: Respiratory complications will improve Outcome: Progressing Goal: Cardiovascular complication will be avoided Outcome: Progressing   Problem: Activity: Goal: Risk for activity intolerance will decrease Outcome: Progressing   Problem: Nutrition: Goal: Adequate nutrition will be maintained Outcome: Progressing   Problem: Coping: Goal: Level of anxiety will decrease Outcome: Progressing   Problem: Elimination: Goal: Will not experience complications related to bowel motility Outcome: Progressing Goal: Will not experience complications related to urinary retention Outcome: Progressing   Problem: Pain Managment: Goal: General experience of comfort will improve Outcome: Progressing   Problem: Safety: Goal: Ability to remain free from injury will improve Outcome: Progressing   Problem: Skin Integrity: Goal: Risk for impaired skin integrity will decrease Outcome: Progressing   Problem: Self-Care: Goal: Ability to communicate needs accurately will improve Outcome: Progressing   Problem: Nutrition: Goal: Risk of aspiration will decrease Outcome: Progressing Goal: Dietary intake will improve Outcome: Progressing   Problem: Ischemic Stroke/TIA Tissue Perfusion: Goal: Complications of ischemic stroke/TIA will be minimized Outcome: Progressing   Problem: Education: Goal:  Knowledge of disease or condition will improve Outcome: Progressing Goal: Knowledge of secondary prevention will improve Outcome: Progressing Goal: Knowledge of patient specific risk factors addressed and post discharge goals established will improve Outcome: Progressing   Problem: Self-Care: Goal: Ability to participate in self-care as condition permits will improve Outcome: Progressing   Problem: Nutrition: Goal: Risk of aspiration will decrease Outcome: Progressing Goal: Dietary intake will improve Outcome: Progressing   Problem: Ischemic Stroke/TIA Tissue Perfusion: Goal: Complications of ischemic stroke/TIA will be minimized Outcome: Progressing   

## 2020-08-14 NOTE — Progress Notes (Signed)
Physical Therapy Treatment Patient Details Name: Adam [name] MRN: [id number] DOB: 1961/05/24 Today's Date: 08/14/2020    History of Present Illness 59 y.o. male with medical history significant for essential hypertension, prior CVA in 2013, seen in [place], who presented to [facility] ED from home with complaints of left arm numbness and drooling. MRI revealed patchy small volume acute right MCA territory infarcts involving the right frontoparietal region and multiple remote lacunar infarcts about the bilateral basal ganglia and right cerebellum.    PT Comments    Pt tolerating both increasing gait distance and dynamic balance challenge today. Pt continues to demonstrate decreased foot clearance bilaterally, especially with fatigue, but improves with external PT cuing. PT continuing to recommend OPPT to address pt balance, gait, functional strength, and activity tolerance deficits.     Follow Up Recommendations  Supervision for mobility/OOB;Outpatient PT     Equipment Recommendations  Other (comment) (refusing gait assistive device)    Recommendations for Other Services       Precautions / Restrictions Precautions Precautions: Fall Restrictions Weight Bearing Restrictions: No    Mobility  Bed Mobility Overal bed mobility: Needs Assistance Bed Mobility: Supine to Sit     Supine to sit: Supervision     General bed mobility comments: supervision for safety  Transfers Overall transfer level: Needs assistance Equipment used: None Transfers: Sit to/from Stand Sit to Stand: Supervision         General transfer comment: supervision for safety  Ambulation/Gait Ambulation/Gait assistance: Min guard Gait Distance (Feet): 550 Feet Assistive device: None Gait Pattern/deviations: Step-through pattern;Decreased stride length;Shuffle;Narrow base of support;Decreased dorsiflexion - right;Decreased dorsiflexion - left Gait velocity: decr   General Gait Details: min gaurd for  safety, balance. Pt with intermittent reaching for environment to steady self. Repeated verbal cues for increasing foot clearance   Stairs             Wheelchair Mobility    Modified Rankin (Stroke Patients Only) Modified Rankin (Stroke Patients Only) Pre-Morbid Rankin Score: Slight disability Modified Rankin: Moderately severe disability     Balance Overall balance assessment: Needs assistance Sitting-balance support: No upper extremity supported;Feet supported Sitting balance-Leahy Scale: Good     Standing balance support: Single extremity supported;No upper extremity supported Standing balance-Leahy Scale: Fair                 High Level Balance Comments: Tandem walking, 2x10 ft; standing marches with SL support with 1 second hold, x5 bilaterally; heel raises to lessen BOS            Cognition Arousal/Alertness: Awake/alert Behavior During Therapy: Flat affect;WFL for tasks assessed/performed Overall Cognitive Status: Impaired/Different from baseline Area of Impairment: Problem solving;Awareness;Following commands;Memory;Attention                   Current Attention Level: Sustained Memory: Decreased short-term memory Following Commands: Follows multi-step commands with increased time   Awareness: Emergent Problem Solving: Requires verbal cues;Slow processing General Comments: Pt losing focus on tasks given by PT after a few seconds today, especially balance interventions.      Exercises General Exercises - Lower Extremity Toe Raises: AROM;Both;Standing;15 reps Mini-Sqauts: AROM;Both;10 reps;Standing (squat to 90* knee flexion, bilateral UE support on bedrail in hallway)    General Comments General comments (skin integrity, edema, etc.): HRmax during session 136 bpm      Pertinent Vitals/Pain Pain Assessment: Faces Faces Pain Scale: Hurts a little bit Pain Location: cortrak Pain Descriptors / Indicators: Discomfort Pain  Intervention(s): Monitored during  session    Home Living                      Prior Function            PT Goals (current goals can now be found in the care plan section) Acute Rehab PT Goals Patient Stated Goal: go home PT Goal Formulation: With patient Time For Goal Achievement: 08/18/20 Potential to Achieve Goals: Good Progress towards PT goals: Progressing toward goals    Frequency    Min 4X/week      PT Plan Current plan remains appropriate    Co-evaluation              AM-PAC PT "6 Clicks" Mobility   Outcome Measure  Help needed turning from your back to your side while in a flat bed without using bedrails?: None Help needed moving from lying on your back to sitting on the side of a flat bed without using bedrails?: None Help needed moving to and from a bed to a chair (including a wheelchair)?: None Help needed standing up from a chair using your arms (e.g., wheelchair or bedside chair)?: None Help needed to walk in hospital room?: A Little Help needed climbing 3-5 steps with a railing? : A Little 6 Click Score: 22    End of Session Equipment Utilized During Treatment: Gait belt Activity Tolerance: Patient limited by fatigue Patient left: with call bell/phone within reach;in bed;with nursing/sitter in room Nurse Communication: Mobility status PT Visit Diagnosis: Unsteadiness on feet (R26.81);Difficulty in walking, not elsewhere classified (R26.2)     Time: 2446-2863 PT Time Calculation (min) (ACUTE ONLY): 26 min  Charges:  $Gait Training: 8-22 mins $Neuromuscular Re-education: 8-22 mins                     Tymara Saur E, PT Acute Rehabilitation Services Pager [phone]  Office [phone]    Tyrone Apple D Despina Hidden 08/14/2020, 5:42 PM

## 2020-08-15 LAB — GLUCOSE, CAPILLARY
Glucose-Capillary: 122 mg/dL — ABNORMAL HIGH (ref 70–99)
Glucose-Capillary: 164 mg/dL — ABNORMAL HIGH (ref 70–99)
Glucose-Capillary: 132 mg/dL — ABNORMAL HIGH (ref 70–99)
Glucose-Capillary: 116 mg/dL — ABNORMAL HIGH (ref 70–99)
Glucose-Capillary: 105 mg/dL — ABNORMAL HIGH (ref 70–99)
Glucose-Capillary: 116 mg/dL — ABNORMAL HIGH (ref 70–99)

## 2020-08-15 LAB — BASIC METABOLIC PANEL
Anion gap: 9 (ref 5–15)
BUN: 32 mg/dL — ABNORMAL HIGH (ref 6–20)
CO2: 28 mmol/L (ref 22–32)
Calcium: 9.6 mg/dL (ref 8.9–10.3)
Chloride: 99 mmol/L (ref 98–111)
Creatinine, Ser: 0.82 mg/dL (ref 0.61–1.24)
GFR calc Af Amer: >60 mL/min (ref >60)
GFR calc non Af Amer: >60 mL/min (ref >60)
Glucose, Bld: 119 mg/dL — ABNORMAL HIGH (ref 70–99)
Potassium: 4 mmol/L (ref 3.5–5.1)
Sodium: 136 mmol/L (ref 135–145)

## 2020-08-15 LAB — CBC WITH DIFFERENTIAL/PLATELET
Abs Immature Granulocytes: 0.01 10*3/uL (ref 0.00–0.07)
Basophils Absolute: 0 10*3/uL (ref 0.0–0.1)
Basophils Relative: 0 %
Eosinophils Absolute: 0.1 10*3/uL (ref 0.0–0.5)
Eosinophils Relative: 1 %
HCT: 40.2 % (ref 39.0–52.0)
Hemoglobin: 13.5 g/dL (ref 13.0–17.0)
Immature Granulocytes: 0 %
Lymphocytes Relative: 26 %
Lymphs Abs: 1.9 10*3/uL (ref 0.7–4.0)
MCH: 25 pg — ABNORMAL LOW (ref 26.0–34.0)
MCHC: 33.6 g/dL (ref 30.0–36.0)
MCV: 74.3 fL — ABNORMAL LOW (ref 80.0–100.0)
Monocytes Absolute: 1.1 10*3/uL — ABNORMAL HIGH (ref 0.1–1.0)
Monocytes Relative: 16 %
Neutro Abs: 3.9 10*3/uL (ref 1.7–7.7)
Neutrophils Relative %: 57 %
Platelets: 171 10*3/uL (ref 150–400)
RBC: 5.41 MIL/uL (ref 4.22–5.81)
RDW: 14.7 % (ref 11.5–15.5)
WBC: 7 10*3/uL (ref 4.0–10.5)
nRBC: 0 % (ref 0.0–0.2)

## 2020-08-15 LAB — PHOSPHORUS: Phosphorus: 3.5 mg/dL (ref 2.5–4.6)

## 2020-08-15 LAB — MAGNESIUM: Magnesium: 2.3 mg/dL (ref 1.7–2.4)

## 2020-08-15 NOTE — Progress Notes (Signed)
Occupational Therapy Treatment Patient Details Name: Adam [name] MRN: [id number] DOB: 1961/05/23 Today's Date: 08/15/2020    History of present illness 59 y.o. male with medical history significant for essential hypertension, prior CVA in 2013, seen in [place], who presented to [facility] ED from home with complaints of left arm numbness and drooling. MRI revealed patchy small volume acute right MCA territory infarcts involving the right frontoparietal region and multiple remote lacunar infarcts about the bilateral basal ganglia and right cerebellum.   OT comments  Pt progressing toward goals. Pt with apparent L inattention in addition to deficits listed below. Pt requires S/set up with ADL tasks at sink level.Recommend 24/7 S and follow up with OT at the neuro outpt center.  Follow Up Recommendations  Outpatient OT;Supervision/Assistance - 24 hour (neuro outpt)    Equipment Recommendations  Tub/shower seat    Recommendations for Other Services PT consult;Speech consult    Precautions / Restrictions Precautions Precautions: Fall Precaution Comments: L inattention       Mobility Bed Mobility Overal bed mobility: Modified Independent                Transfers Overall transfer level: Needs assistance     Sit to Stand: Supervision Stand pivot transfers: Supervision            Balance Overall balance assessment: Needs assistance   Sitting balance-Leahy Scale: Good       Standing balance-Leahy Scale: Fair                             ADL either performed or assessed with clinical judgement   ADL       Grooming: Wash/dry face;Wash/dry hands;Applying deodorant;Set up;Supervision/safety;Standing   Upper Body Bathing: Supervision/ safety;Set up;Standing   Lower Body Bathing: Set up;Supervison/ safety;Sit to/from stand   Upper Body Dressing : Supervision/safety;Set up;Sitting   Lower Body Dressing: Set up;Supervision/safety;Sit to/from stand Lower  Body Dressing Details (indicate cue type and reason): note L lat lean when donning socks but able to maintain balance Toilet Transfer: Supervision/safety;Ambulation   Toileting- Clothing Manipulation and Hygiene: Supervision/safety       Functional mobility during ADLs: Supervision/safety       Vision   Vision Assessment?: Vision impaired- to be further tested in functional context Additional Comments: decreased visual attention; will further assess   Perception     Praxis      Cognition Arousal/Alertness: Awake/alert Behavior During Therapy: Flat affect Overall Cognitive Status: Impaired/Different from baseline Area of Impairment: Attention;Safety/judgement;Awareness;Problem solving                   Current Attention Level: Selective Memory: Decreased short-term memory Following Commands: Follows one step commands with increased time Safety/Judgement: Decreased awareness of safety;Decreased awareness of deficits Awareness: Emergent Problem Solving: Slow processing General Comments: Decreased awareness of position of coretrack when urgently moving toward the bathroom - coretrack pole on L side - ?inattention; during bath, washed periarea then attempted to wash face with same cloth        Exercises     Shoulder Instructions       General Comments Apparent L inattention; during copy clock, pt completely avoided L side of clock drawing wtih numbers placed only on R side on clock. Unablet o set clock hands appropriately; During bath, pt washed only R side of face; using BUE functionally; note decreased in-hand manipulation skills but appears functional    Pertinent Vitals/ Pain  Pain Assessment: Faces Faces Pain Scale: Hurts a little bit Pain Location: cortrak Pain Descriptors / Indicators: Discomfort Pain Intervention(s): Limited activity within patient's tolerance  Home Living                                          Prior  Functioning/Environment              Frequency  Min 2X/week        Progress Toward Goals  OT Goals(current goals can now be found in the care plan section)  Progress towards OT goals: Progressing toward goals  Acute Rehab OT Goals Patient Stated Goal: go home OT Goal Formulation: With patient Time For Goal Achievement: 08/20/20 Potential to Achieve Goals: Good ADL Goals Pt Will Transfer to Toilet: with modified independence;ambulating;regular height toilet Pt Will Perform Tub/Shower Transfer: Tub transfer;with modified independence;ambulating;shower seat Additional ADL Goal #1: Pt will follow three step command during ADL with Min cues Additional ADL Goal #2: Pt will perform four part trail making task with Min cues Additional ADL Goal #3: Pt will perform simple IADL with Min cues  Plan Discharge plan needs to be updated    Co-evaluation                 AM-PAC OT "6 Clicks" Daily Activity     Outcome Measure   Help from another person eating meals?: Total Help from another person taking care of personal grooming?: A Little Help from another person toileting, which includes using toliet, bedpan, or urinal?: A Little Help from another person bathing (including washing, rinsing, drying)?: A Little Help from another person to put on and taking off regular upper body clothing?: A Little Help from another person to put on and taking off regular lower body clothing?: A Little 6 Click Score: 16    End of Session    OT Visit Diagnosis: Unsteadiness on feet (R26.81);Other abnormalities of gait and mobility (R26.89);Muscle weakness (generalized) (M62.81);Other symptoms and signs involving cognitive function;Pain Pain - part of body:  (coretrack)   Activity Tolerance Patient tolerated treatment well   Patient Left in chair;with call bell/phone within reach;with chair alarm set   Nurse Communication Mobility status;Other (comment) (DC plan; Diet status)         Time: 4709-6283 OT Time Calculation (min): 34 min  Charges: OT General Charges $OT Visit: 1 Visit OT Treatments $Self Care/Home Management : 23-37 mins  Luisa Dago, OT/L   Acute OT Clinical Specialist Acute Rehabilitation Services Pager [phone] Office [phone]    [facility] 08/15/2020, 11:06 AM

## 2020-08-15 NOTE — Progress Notes (Signed)
Nutrition Follow-up  DOCUMENTATION CODES:   Severe malnutrition in context of social or environmental circumstances  INTERVENTION:  Once PEG is placed and ready for use: -Initiate Osmolite 1.5 cal @ 54ml/hr -50ml Prosource TF TID  At goal, tube feeding provides 2280 kcals, 123 grams protein, 1064ml free water  -Continue Vitamin B12 1070mcg po daily -Continue MVI daily   NUTRITION DIAGNOSIS:   Severe Malnutrition related to social / environmental circumstances as evidenced by severe muscle depletion, severe fat depletion, energy intake < or equal to 50% for > or equal to 1 month.  Ongoing  GOAL:   Patient will meet greater than or equal to 90% of their needs  Met with TF  MONITOR:   PO intake, Supplement acceptance, Weight trends, Labs, I & O's  REASON FOR ASSESSMENT:   Consult Assessment of nutrition requirement/status  ASSESSMENT:   Pt admitted for CVA. PMH includes HTN and prior CVA (2013).  9/16 s/p TTE 9/17 s/p TEE; Cortrak placement (gastric)  Pt to have PEG placed today.   Current TF: Osmolite 1.2 cal @ 19ml/hr, 35ml Prosource TF dialy  Labs: CBGs 107-113 Medications: MVI, Thiamine, Vitamin B12  Diet Order:   Diet Order            Diet NPO time specified  Diet effective now                 EDUCATION NEEDS:   Education needs have been addressed  Skin:  Skin Assessment: Reviewed RN Assessment  Last BM:  9/23  Height:   Ht Readings from Last 1 Encounters:  08/14/20 6' (1.829 m)    Weight:   Wt Readings from Last 1 Encounters:  08/16/20 58.1 kg    BMI:  Body mass index is 17.37 kg/m.  Estimated Nutritional Needs:   Kcal:  2150-2350  Protein:  105-115 grams  Fluid:  >/=2L/d    Larkin Ina, MS, RD, LDN RD pager number and weekend/on-call pager number located in [place].

## 2020-08-15 NOTE — Progress Notes (Signed)
Physical Therapy Treatment Patient Details Name: Adam [name] MRN: [id number] DOB: 02/03/1961 Today's Date: 08/15/2020    History of Present Illness 59 y.o. male with medical history significant for essential hypertension, prior CVA in 2013, seen in [place], who presented to [facility] ED from home with complaints of left arm numbness and drooling. MRI revealed patchy small volume acute right MCA territory infarcts involving the right frontoparietal region and multiple remote lacunar infarcts about the bilateral basal ganglia and right cerebellum.    PT Comments    PT planned outing to piano in [facility]rium on first floor of the hospital. Pt ambulated 2x500 ft without AD and min cuing for increasing foot clearance L>R. Once arrived at piano, pt with difficulty playing piano with L hand, suspect due to inattention vs sensory deficit as pt states "my hand is still numb". PT continuing to recommend OPPT, will continue to follow acutely.    Follow Up Recommendations  Supervision for mobility/OOB;Outpatient PT     Equipment Recommendations  Other (comment) (refusing gait assistive device)    Recommendations for Other Services       Precautions / Restrictions Precautions Precautions: Fall Precaution Comments: L inattention Restrictions Weight Bearing Restrictions: No    Mobility  Bed Mobility Overal bed mobility: Needs Assistance Bed Mobility: Supine to Sit     Supine to sit: Supervision     General bed mobility comments: supervision for safety, increased time  Transfers Overall transfer level: Needs assistance Equipment used: None Transfers: Sit to/from Stand Sit to Stand: Supervision         General transfer comment: supervision for safety  Ambulation/Gait Ambulation/Gait assistance: Min guard Gait Distance (Feet): 500 Feet (x2) Assistive device: None Gait Pattern/deviations: Step-through pattern;Decreased stride length;Shuffle;Narrow base of support;Decreased dorsiflexion -  right;Decreased dorsiflexion - left Gait velocity: decr   General Gait Details: min gaurd for safety, balance. Repeated verbal cuing for increased foot clearance, responds best to cue "big steps"   Stairs             Wheelchair Mobility    Modified Rankin (Stroke Patients Only) Modified Rankin (Stroke Patients Only) Pre-Morbid Rankin Score: Slight disability Modified Rankin: Moderately severe disability     Balance Overall balance assessment: Needs assistance   Sitting balance-Leahy Scale: Good Sitting balance - Comments: posterior and L leaning with donning socks and shoes     Standing balance-Leahy Scale: Fair                              Cognition Arousal/Alertness: Awake/alert Behavior During Therapy: Flat affect Overall Cognitive Status: Impaired/Different from baseline Area of Impairment: Attention;Safety/judgement;Awareness;Problem solving                   Current Attention Level: Selective Memory: Decreased short-term memory Following Commands: Follows one step commands with increased time Safety/Judgement: Decreased awareness of safety;Decreased awareness of deficits Awareness: Emergent Problem Solving: Slow processing        Exercises      General Comments        Pertinent Vitals/Pain Pain Assessment: No/denies pain Faces Pain Scale: No hurt Pain Intervention(s): Monitored during session    Home Living                      Prior Function            PT Goals (current goals can now be found in the care plan section) Acute Rehab PT  Goals Patient Stated Goal: go home PT Goal Formulation: With patient Time For Goal Achievement: 08/18/20 Potential to Achieve Goals: Good Progress towards PT goals: Progressing toward goals    Frequency    Min 4X/week      PT Plan Current plan remains appropriate    Co-evaluation              AM-PAC PT "6 Clicks" Mobility   Outcome Measure  Help needed  turning from your back to your side while in a flat bed without using bedrails?: None Help needed moving from lying on your back to sitting on the side of a flat bed without using bedrails?: None Help needed moving to and from a bed to a chair (including a wheelchair)?: None Help needed standing up from a chair using your arms (e.g., wheelchair or bedside chair)?: None Help needed to walk in hospital room?: A Little Help needed climbing 3-5 steps with a railing? : A Little 6 Click Score: 22    End of Session Equipment Utilized During Treatment: Gait belt Activity Tolerance: Patient limited by fatigue Patient left: with call bell/phone within reach;in bed;with nursing/sitter in room Nurse Communication: Mobility status PT Visit Diagnosis: Unsteadiness on feet (R26.81);Difficulty in walking, not elsewhere classified (R26.2)     Time: 1536-1600 PT Time Calculation (min) (ACUTE ONLY): 24 min  Charges:  $Gait Training: 8-22 mins                     Bernon Arviso E, PT Acute Rehabilitation Services Pager [phone]  Office [phone]    Jeffory Snelgrove D Despina Hidden 08/15/2020, 4:53 PM

## 2020-08-15 NOTE — Progress Notes (Signed)
   08/15/20 1630  Clinical Encounter Type  Visited With Patient  Visit Type Initial  Referral From Nurse  Consult/Referral To Chaplain  Chaplain visited patient and prayed at bedside.This note was prepared by Deneen Harts, M.Div..  For questions please contact by phone [phone].

## 2020-08-15 NOTE — Progress Notes (Signed)
  Speech Language Pathology Treatment: Dysphagia;Cognitive-Linquistic  Patient Details Name: Adam [name] MRN: [id number] DOB: 06-13-1961 Today's Date: 08/15/2020 Time: 4332-9518 SLP Time Calculation (min) (ACUTE ONLY): 23 min  Assessment / Plan / Recommendation Clinical Impression  Intervention focused on dysarthria with dysphagia towards end. He continues to demonstrate moderate-significant dysarthria mainly impacted by low volume and increase rate. Therapist educated and demonstrated compensatory strategies and he spontaneously used x 1 by stopping, deeper inhalation and attempted to increase volume. Showed pt increased articulatory movement which is limited by facial contraction with bulbar involvement primarily on right.  He performed oral care followed by ice chip trials with min manipulation and needing to recline to initiate swallow. He was able to swallow once in a more upright position. Cues for labial closure of throat clear were not effective. Plans are for PEG placement tomorrow. ST will continue.    HPI HPI: Patient is a 59 y.o. 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 Per chart review, his sister had reported she noticed bifacial weakness, significant worsened speech and significantly worsened gait. MRI brain revealed patchy small volume acute right MCA territory infarcts involving right frontoparietal region but no associated hemorrhage or mass effect, as well as underlying advanced cerebral atrophy and chronic microvascular ischemic disease, multiple remote lacunar infarcts about the basal ganglia and right cerebellum.      SLP Plan  Continue with current plan of care       Recommendations  Diet recommendations: Other(comment) (ice chips)                Oral Care Recommendations: Oral care QID Follow up Recommendations: Home  health SLP SLP Visit Diagnosis: Dysarthria and anarthria (R47.1);Dysphagia, unspecified (R13.10) Plan: Continue with current plan of care                       Adam [name] 08/15/2020, 3:54 PM   Adam [name].Ed Nurse, children's [phone] Office [phone]

## 2020-08-15 NOTE — Plan of Care (Signed)
  Problem: Education: Goal: Knowledge of General Education information will improve Description: Including pain rating scale, medication(s)/side effects and non-pharmacologic comfort measures Outcome: Progressing   Problem: Health Behavior/Discharge Planning: Goal: Ability to manage health-related needs will improve Outcome: Progressing   Problem: Clinical Measurements: Goal: Ability to maintain clinical measurements within normal limits will improve Outcome: Progressing Goal: Will remain free from infection Outcome: Progressing Goal: Diagnostic test results will improve Outcome: Progressing Goal: Respiratory complications will improve Outcome: Progressing Goal: Cardiovascular complication will be avoided Outcome: Progressing   Problem: Activity: Goal: Risk for activity intolerance will decrease Outcome: Progressing   Problem: Nutrition: Goal: Adequate nutrition will be maintained Outcome: Progressing   Problem: Coping: Goal: Level of anxiety will decrease Outcome: Progressing   Problem: Elimination: Goal: Will not experience complications related to bowel motility Outcome: Progressing Goal: Will not experience complications related to urinary retention Outcome: Progressing   Problem: Pain Managment: Goal: General experience of comfort will improve Outcome: Progressing   Problem: Safety: Goal: Ability to remain free from injury will improve Outcome: Progressing   Problem: Skin Integrity: Goal: Risk for impaired skin integrity will decrease Outcome: Progressing   Problem: Self-Care: Goal: Ability to communicate needs accurately will improve Outcome: Progressing   Problem: Nutrition: Goal: Risk of aspiration will decrease Outcome: Progressing Goal: Dietary intake will improve Outcome: Progressing   Problem: Ischemic Stroke/TIA Tissue Perfusion: Goal: Complications of ischemic stroke/TIA will be minimized Outcome: Progressing   Problem: Education: Goal:  Knowledge of disease or condition will improve Outcome: Progressing Goal: Knowledge of secondary prevention will improve Outcome: Progressing Goal: Knowledge of patient specific risk factors addressed and post discharge goals established will improve Outcome: Progressing   Problem: Self-Care: Goal: Ability to participate in self-care as condition permits will improve Outcome: Progressing   Problem: Nutrition: Goal: Risk of aspiration will decrease Outcome: Progressing Goal: Dietary intake will improve Outcome: Progressing   Problem: Ischemic Stroke/TIA Tissue Perfusion: Goal: Complications of ischemic stroke/TIA will be minimized Outcome: Progressing

## 2020-08-15 NOTE — Progress Notes (Signed)
PROGRESS NOTE  Adam [name]  DOB: [birth year]  PCP: Patient, [name] [id number]  DOA: 08/03/2020  LOS: 11 days   Chief Complaint  Patient presents with  . Cerebrovascular Accident    Brief narrative: Adam [name] is a 59 y.o. year old male with medical history significant for HTN, prior stroke (residual right-sided weakness and dysarthria) who presented on 08/03/2020 with acute onset drooling, left arm/torso sensory changes, headache, bifacial weakness and worsening speech and gait. He was found to have acute right MCA territory infarcts.CTA of head showed high-grade focal distal stenosis within the distal A2/proximal A2 left anterior cerebral artery with high-grade focal stenosis in the P2 left PCA high-grade stenosis within the P4 right PCA.  CTA neck showed no significant stenosis.  TTE showed preserved EF.  LDL 80, A1c 5.7  Hospital course was complicated by persistent drooling and decreased p.o. intake requiring NG tube placement for tube feeds.   Now awaiting Plavix washout in order to have IR placed PEG tube on Friday 9/24  Subjective: Patient was seen and examined this morning.   Sitting up in chair.  Not in distress.  Thin built.   Has feeding going through Dobbhoff tube. No new complaint.  Assessment/Plan: Acute right MCA territory infarcts, embolic event unclear source currently.   -TEE with preserved EF, no masses, no PFO -Does have some left-sided weakness but able to mobilize well and does not currently need CIR. at discharge home we will set up for home health PT -Per neurology, continue aspirin, Plavix x3 weeks followed by aspirin alone per neurology -Currently Plavix is on hold for 5 days with the plan of PEG tube placement on 9/24 by IR.  Continue aspirin only for now. -continue high intensity level statin  Increased drooling and dysphagia/hypophonia, unclear if this is residual deficit of recent stroke/apraxia or pseudobulbar state related to dysphagia.    -Currently n.p.o. receiving nutrition through core track -Patient agreeable for IR placed feeding tube -Continue intensive speech therapy while inpatient  B12 deficiency -in the setting of severe calorie protein malnutrition B12 90 -continue B12, thiamine, multivitamin supplementation Recent Labs    08/03/20 1427 08/03/20 2355 08/14/20 0224 08/15/20 0142  MCV   < >  --  74.6* 74.3*  VITAMINB12  --  90*  --   --   FOLATE  --  18.1  --   --    < > = values in this interval not displayed.   Hypertension -Currently blood pressure is in goal with lisinopril, HCTZ and amlodipine.  Continue to monitor. -TSH normal.  AKI, resolved Baseline creatinine 0.9-1.  Peak creatinine 1.3, now back to baseline.  Like related to diminished oral intake.  Seems to be tolerating low-dose lisinopril well -Monitor BMP -Avoid nephrotoxins Recent Labs    08/03/20 1427 08/03/20 1438 08/07/20 1101 08/08/20 0135 08/09/20 0101 08/10/20 0443 08/15/20 0142  BUN 29* 30* 39* 45* 52* 48* 32*  CREATININE 1.10 0.90 1.30* 1.27* 1.31* 1.06 0.82   Hypokalemia, mild, resolved.   Recent Labs  Lab 08/09/20 0101 08/09/20 1428 08/09/20 1652 08/10/20 0443 08/10/20 1614 08/15/20 0142  K 3.8  --   --  3.5  --  4.0  MG  --  2.3 2.3 2.4 2.5* 2.3  PHOS  --  3.3 3.2 3.0 2.8 3.5   Severe malnutrition.   -Has fair muscle atrophy on examination -Given persistent drooling and difficulty swallowing even a liquid diet now on tube feeds via cor track  -Appreciate speech  and nutrition consultations  Mobility: Encourage ambulation Code Status:   Code Status: Full Code  Nutritional status: Body mass index is 17.37 kg/m. Nutrition Problem: Severe Malnutrition Etiology: social / environmental circumstances Signs/Symptoms: severe muscle depletion, severe fat depletion, energy intake < or equal to 50% for > or equal to 1 month Diet Order            Diet NPO time specified  Diet effective now                  DVT prophylaxis: enoxaparin (LOVENOX) injection 40 mg Start: 08/04/20 1000   Antimicrobials:  None Fluid: None Consultants: IR, neurology Family Communication:  None at bedside  Status is: Inpatient  Remains inpatient appropriate because -pending PEG tube placement 9/24. Dispo:  Patient From: Home  Planned Disposition: Home  Expected discharge date: 08/19/20  Medically stable for discharge: No   Infusions:  . feeding supplement (OSMOLITE 1.2 CAL) 65 mL/hr at 08/13/20 2359    Scheduled Meds: . amLODipine  5 mg Per Tube Daily  . aspirin  81 mg Per Tube Daily   Or  . aspirin  300 mg Rectal Daily  . atorvastatin  80 mg Per Tube Daily  . enoxaparin (LOVENOX) injection  40 mg Subcutaneous Daily  . feeding supplement (PROSource TF)  45 mL Per Tube Daily  . hydrochlorothiazide  25 mg Per Tube Daily  . lisinopril  10 mg Per Tube Daily  . multivitamin  15 mL Per Tube Daily  . thiamine  100 mg Per Tube Daily  . vitamin B-12  1,000 mcg Per Tube Daily    Antimicrobials: Anti-infectives (From admission, onward)   None      PRN meds: hydrALAZINE, Resource ThickenUp Clear, traMADol   Objective: Vitals:   08/14/20 2142 08/15/20 0800  BP: 104/83 113/74  Pulse: 95 91  Resp: 20 16  Temp: 98.6 F (37 C) (!) 97.5 F (36.4 C)  SpO2: 100% 100%    Intake/Output Summary (Last 24 hours) at 08/15/2020 1306 Last data filed at 08/15/2020 0500 Gross per 24 hour  Intake --  Output 300 ml  Net -300 ml   Filed Weights   08/14/20 0612 08/14/20 1848 08/15/20 0500  Weight: 57.7 kg 56.3 kg 58.1 kg   Weight change: -1.4 kg Body mass index is 17.37 kg/m.   Physical Exam: General exam: Appears calm and comfortable.  Not in physical distress.  Thin built Skin: No rashes, lesions or ulcers. HEENT: Atraumatic, normocephalic, supple neck, no obvious bleeding.  Tube feeding on through Dobbhoff Lungs: Clear to auscultation bilaterally CVS: Regular rate and rhythm, no murmur GI/Abd  soft, nontender, nondistended, bowel sound present CNS: Alert, awake Psychiatry: Mood appropriate Extremities: No calf tenderness, no pedal edema  Data Review: I have personally reviewed the laboratory data and studies available.  Recent Labs  Lab 08/14/20 0224 08/15/20 0142  WBC 7.1 7.0  NEUTROABS  --  3.9  HGB 14.2 13.5  HCT 43.1 40.2  MCV 74.6* 74.3*  PLT 182 171   Recent Labs  Lab 08/09/20 0101 08/09/20 1428 08/09/20 1652 08/10/20 0443 08/10/20 1614 08/15/20 0142  NA 140  --   --  141  --  136  K 3.8  --   --  3.5  --  4.0  CL 106  --   --  106  --  99  CO2 22  --   --  23  --  28  GLUCOSE 92  --   --  98  --  119*  BUN 52*  --   --  48*  --  32*  CREATININE 1.31*  --   --  1.06  --  0.82  CALCIUM 9.9  --   --  9.5  --  9.6  MG  --  2.3 2.3 2.4 2.5* 2.3  PHOS  --  3.3 3.2 3.0 2.8 3.5    F/u labs ordered  Signed, Lorin Glass, MD [facility] 08/15/2020

## 2020-08-16 ENCOUNTER — Inpatient Hospital Stay (HOSPITAL_COMMUNITY): Payer: Medicaid Other

## 2020-08-16 HISTORY — PX: IR GASTROSTOMY TUBE MOD SED: IMG625

## 2020-08-16 LAB — GLUCOSE, CAPILLARY
Glucose-Capillary: 107 mg/dL — ABNORMAL HIGH (ref 70–99)
Glucose-Capillary: 113 mg/dL — ABNORMAL HIGH (ref 70–99)
Glucose-Capillary: 104 mg/dL — ABNORMAL HIGH (ref 70–99)
Glucose-Capillary: 130 mg/dL — ABNORMAL HIGH (ref 70–99)
Glucose-Capillary: 135 mg/dL — ABNORMAL HIGH (ref 70–99)
Glucose-Capillary: 134 mg/dL — ABNORMAL HIGH (ref 70–99)

## 2020-08-16 MED ORDER — MORPHINE SULFATE (PF) 2 MG/ML IV SOLN
2.0000 mg | Freq: Four times a day (QID) | INTRAVENOUS | Status: DC | PRN
  Administered 2020-08-16 – 2020-08-18 (×6): 2 mg via INTRAVENOUS
  Filled 2020-08-16 (×6): qty 1

## 2020-08-16 MED ORDER — PROSOURCE TF PO LIQD
45.0000 mL | Freq: Three times a day (TID) | ORAL | Status: DC
  Administered 2020-08-16 – 2020-08-21 (×15): 45 mL
  Filled 2020-08-16 (×15): qty 45

## 2020-08-16 MED ORDER — GLUCAGON HCL RDNA (DIAGNOSTIC) 1 MG IJ SOLR
INTRAMUSCULAR | Status: AC
  Filled 2020-08-16: qty 1

## 2020-08-16 MED ORDER — MIDAZOLAM HCL 2 MG/2ML IJ SOLN
INTRAMUSCULAR | Status: AC
  Filled 2020-08-16: qty 2

## 2020-08-16 MED ORDER — GLUCAGON HCL (RDNA) 1 MG IJ SOLR
INTRAMUSCULAR | Status: AC | PRN
  Administered 2020-08-16: .5 mg via INTRAVENOUS

## 2020-08-16 MED ORDER — LIDOCAINE HCL 1 % IJ SOLN
INTRAMUSCULAR | Status: AC
  Filled 2020-08-16: qty 20

## 2020-08-16 MED ORDER — MIDAZOLAM HCL 2 MG/2ML IJ SOLN
INTRAMUSCULAR | Status: AC | PRN
  Administered 2020-08-16: 1 mg via INTRAVENOUS
  Administered 2020-08-16: 0.5 mg via INTRAVENOUS

## 2020-08-16 MED ORDER — OSMOLITE 1.5 CAL PO LIQD
1000.0000 mL | ORAL | Status: DC
  Administered 2020-08-16 – 2020-08-21 (×2): 1000 mL
  Filled 2020-08-16 (×9): qty 1000

## 2020-08-16 MED ORDER — FENTANYL CITRATE (PF) 100 MCG/2ML IJ SOLN
INTRAMUSCULAR | Status: AC | PRN
  Administered 2020-08-16 (×2): 25 ug via INTRAVENOUS

## 2020-08-16 MED ORDER — FENTANYL CITRATE (PF) 100 MCG/2ML IJ SOLN
INTRAMUSCULAR | Status: AC
  Filled 2020-08-16: qty 2

## 2020-08-16 MED ORDER — CEFAZOLIN SODIUM-DEXTROSE 2-4 GM/100ML-% IV SOLN: 2.0000 g | Freq: Once | INTRAVENOUS | Status: AC

## 2020-08-16 MED ORDER — LIDOCAINE HCL (PF) 1 % IJ SOLN
INTRAMUSCULAR | Status: AC | PRN
  Administered 2020-08-16: 10 mL

## 2020-08-16 MED ORDER — CEFAZOLIN SODIUM-DEXTROSE 2-4 GM/100ML-% IV SOLN
INTRAVENOUS | Status: AC
  Administered 2020-08-16: 2 g via INTRAVENOUS
  Filled 2020-08-16: qty 100

## 2020-08-16 MED ORDER — IOHEXOL 300 MG/ML  SOLN
50.0000 mL | Freq: Once | INTRAMUSCULAR | Status: AC | PRN
  Administered 2020-08-16: 10 mL

## 2020-08-16 NOTE — Procedures (Signed)
Interventional Radiology Procedure Note  Procedure: 20 FR PULL THRU GTUBE    Complications: None  Estimated Blood Loss:  MIN  Findings: CONFIRMED IN THE STOMACH    M. Ruel Favors, MD

## 2020-08-16 NOTE — Plan of Care (Signed)
  Problem: Education: Goal: Knowledge of General Education information will improve Description: Including pain rating scale, medication(s)/side effects and non-pharmacologic comfort measures Outcome: Progressing   Problem: Health Behavior/Discharge Planning: Goal: Ability to manage health-related needs will improve Outcome: Progressing   Problem: Clinical Measurements: Goal: Ability to maintain clinical measurements within normal limits will improve Outcome: Progressing Goal: Will remain free from infection Outcome: Progressing Goal: Diagnostic test results will improve Outcome: Progressing Goal: Respiratory complications will improve Outcome: Progressing Goal: Cardiovascular complication will be avoided Outcome: Progressing   Problem: Activity: Goal: Risk for activity intolerance will decrease Outcome: Progressing   Problem: Nutrition: Goal: Adequate nutrition will be maintained Outcome: Progressing   Problem: Coping: Goal: Level of anxiety will decrease Outcome: Progressing   Problem: Elimination: Goal: Will not experience complications related to bowel motility Outcome: Progressing Goal: Will not experience complications related to urinary retention Outcome: Progressing   Problem: Pain Managment: Goal: General experience of comfort will improve Outcome: Progressing   Problem: Safety: Goal: Ability to remain free from injury will improve Outcome: Progressing   Problem: Skin Integrity: Goal: Risk for impaired skin integrity will decrease Outcome: Progressing   Problem: Self-Care: Goal: Ability to communicate needs accurately will improve Outcome: Progressing   Problem: Nutrition: Goal: Risk of aspiration will decrease Outcome: Progressing Goal: Dietary intake will improve Outcome: Progressing   Problem: Ischemic Stroke/TIA Tissue Perfusion: Goal: Complications of ischemic stroke/TIA will be minimized Outcome: Progressing   Problem: Education: Goal:  Knowledge of disease or condition will improve Outcome: Progressing Goal: Knowledge of secondary prevention will improve Outcome: Progressing Goal: Knowledge of patient specific risk factors addressed and post discharge goals established will improve Outcome: Progressing   Problem: Self-Care: Goal: Ability to participate in self-care as condition permits will improve Outcome: Progressing   Problem: Nutrition: Goal: Risk of aspiration will decrease Outcome: Progressing Goal: Dietary intake will improve Outcome: Progressing   Problem: Ischemic Stroke/TIA Tissue Perfusion: Goal: Complications of ischemic stroke/TIA will be minimized Outcome: Progressing   

## 2020-08-16 NOTE — Progress Notes (Signed)
PROGRESS NOTE  Adam [name]  DOB: [birth year]  PCP: Patient, [name] [id number]  DOA: 08/03/2020  LOS: 12 days   Chief Complaint  Patient presents with  . Cerebrovascular Accident    Brief narrative: Adam [name] is a 59 y.o. year old male with medical history significant for HTN, prior stroke (residual right-sided weakness and dysarthria) who presented on 08/03/2020 with acute onset drooling, left arm/torso sensory changes, headache, bifacial weakness and worsening speech and gait. He was found to have acute right MCA territory infarcts.CTA of head showed high-grade focal distal stenosis within the distal A2/proximal A2 left anterior cerebral artery with high-grade focal stenosis in the P2 left PCA high-grade stenosis within the P4 right PCA.  CTA neck showed no significant stenosis.  TTE showed preserved EF.  LDL 80, A1c 5.7  Hospital course was complicated by persistent drooling and decreased p.o. intake requiring NG tube placement for tube feeds.   Now awaiting Plavix washout in order to have IR placed PEG tube on Friday 9/24  Subjective: Patient was seen and examined this morning.   Eagerly waiting for PEG tube placement.   Deliberately once Dobbhoff out  no new complaint.  Assessment/Plan: Acute right MCA territory infarcts, embolic event unclear source currently.   -TEE with preserved EF, no masses, no PFO -Does have some left-sided weakness but able to mobilize well and does not currently need CIR. at discharge home we will set up for home health PT -Per neurology, continue aspirin, Plavix x3 weeks followed by aspirin alone per neurology -Currently Plavix is on hold for 5 days with the plan of PEG tube placement today by IR. Continue aspirin only for now. -continue high intensity level statin  Increased drooling and dysphagia/hypophonia, unclear if this is residual deficit of recent stroke/apraxia or pseudobulbar state related to dysphagia.   -Currently n.p.o.  receiving nutrition through core track -Patient agreeable for IR placed feeding tube  -Continue intensive speech therapy while inpatient  B12 deficiency -in the setting of severe calorie protein malnutrition B12 90 -continue B12, thiamine, multivitamin supplementation Recent Labs    08/03/20 1427 08/03/20 2355 08/14/20 0224 08/15/20 0142  MCV   < >  --  74.6* 74.3*  VITAMINB12  --  90*  --   --   FOLATE  --  18.1  --   --    < > = values in this interval not displayed.   Hypertension -Currently blood pressure is in goal with lisinopril, HCTZ and amlodipine.  Continue to monitor. -TSH normal.  AKI, resolved Baseline creatinine 0.9-1.  Peak creatinine 1.3, now back to baseline.  Like related to diminished oral intake.  Seems to be tolerating low-dose lisinopril well -Monitor BMP -Avoid nephrotoxins Recent Labs    08/03/20 1427 08/03/20 1438 08/07/20 1101 08/08/20 0135 08/09/20 0101 08/10/20 0443 08/15/20 0142  BUN 29* 30* 39* 45* 52* 48* 32*  CREATININE 1.10 0.90 1.30* 1.27* 1.31* 1.06 0.82   Hypokalemia -mild, resolved.   Recent Labs  Lab 08/09/20 1428 08/09/20 1652 08/10/20 0443 08/10/20 1614 08/15/20 0142  K  --   --  3.5  --  4.0  MG 2.3 2.3 2.4 2.5* 2.3  PHOS 3.3 3.2 3.0 2.8 3.5   Severe malnutrition.   -Has fair muscle atrophy on examination -Given persistent drooling and difficulty swallowing even a liquid diet now on tube feeds via cor track  -Appreciate speech and nutrition consultations  Mobility: Encourage ambulation Code Status:   Code Status: Full Code  Nutritional status: Body mass index is 17.37 kg/m. Nutrition Problem: Severe Malnutrition Etiology: social / environmental circumstances Signs/Symptoms: severe muscle depletion, severe fat depletion, energy intake < or equal to 50% for > or equal to 1 month Diet Order            Diet NPO time specified  Diet effective now                 DVT prophylaxis: enoxaparin (LOVENOX)  injection 40 mg Start: 08/04/20 1000   Antimicrobials:  None Fluid: None Consultants: IR, neurology Family Communication:  None at bedside  Status is: Inpatient  Remains inpatient appropriate because -pending PEG tube placement 9/24. Dispo:  Patient From: Home  Planned Disposition: Home  Expected discharge date: 08/19/20  Medically stable for discharge: No   Infusions:  . feeding supplement (OSMOLITE 1.5 CAL)      Scheduled Meds: . amLODipine  5 mg Per Tube Daily  . aspirin  81 mg Per Tube Daily   Or  . aspirin  300 mg Rectal Daily  . atorvastatin  80 mg Per Tube Daily  . enoxaparin (LOVENOX) injection  40 mg Subcutaneous Daily  . feeding supplement (PROSource TF)  45 mL Per Tube TID  . hydrochlorothiazide  25 mg Per Tube Daily  . lisinopril  10 mg Per Tube Daily  . multivitamin  15 mL Per Tube Daily  . thiamine  100 mg Per Tube Daily  . vitamin B-12  1,000 mcg Per Tube Daily    Antimicrobials: Anti-infectives (From admission, onward)   None      PRN meds: hydrALAZINE, Resource ThickenUp Clear, traMADol   Objective: Vitals:   08/15/20 2141 08/16/20 0755  BP: 107/74 108/78  Pulse: (!) 108 (!) 118  Resp: 20 15  Temp: 98.1 F (36.7 C) 98.4 F (36.9 C)  SpO2: 98% 96%   No intake or output data in the 24 hours ending 08/16/20 0940 Filed Weights   08/14/20 1848 08/15/20 0500 08/16/20 0500  Weight: 56.3 kg 58.1 kg 58.1 kg   Weight change: 1.8 kg Body mass index is 17.37 kg/m.   Physical Exam: General exam: Appears calm and comfortable.  Not in physical distress.  Thin built Skin: No rashes, lesions or ulcers. HEENT: Atraumatic, normocephalic, supple neck, no obvious bleeding.  Tube feeding on through Dobbhoff Lungs: Clear to auscultation bilaterally CVS: Regular rate and rhythm, no murmur GI/Abd soft, nontender, nondistended, bowel sound present CNS: Alert, awake Psychiatry: Mood appropriate Extremities: No calf tenderness, no pedal edema  Data  Review: I have personally reviewed the laboratory data and studies available.  Recent Labs  Lab 08/14/20 0224 08/15/20 0142  WBC 7.1 7.0  NEUTROABS  --  3.9  HGB 14.2 13.5  HCT 43.1 40.2  MCV 74.6* 74.3*  PLT 182 171   Recent Labs  Lab 08/09/20 1428 08/09/20 1652 08/10/20 0443 08/10/20 1614 08/15/20 0142  NA  --   --  141  --  136  K  --   --  3.5  --  4.0  CL  --   --  106  --  99  CO2  --   --  23  --  28  GLUCOSE  --   --  98  --  119*  BUN  --   --  48*  --  32*  CREATININE  --   --  1.06  --  0.82  CALCIUM  --   --  9.5  --  9.6  MG 2.3 2.3 2.4 2.5* 2.3  PHOS 3.3 3.2 3.0 2.8 3.5    F/u labs ordered  Signed, Lorin Glass, MD [facility] 08/16/2020

## 2020-08-16 NOTE — TOC Progression Note (Addendum)
Transition of Care [facility]) - Progression Note    Patient Details  Name: Sequoyah Counterman MRN: [id number] Date of Birth: July 25, 1961  Transition of Care [facility]) CM/SW Contact  Lorri Frederick, LCSW Phone Number: 08/16/2020, 9:05 AM  Clinical Narrative:   CSW spoke with pt sister, [name], who reports that family has decided that pt is going to stay with his son, Mena Goes, in [place], near [place].  All follow up will need to be there.  [id number]: CSW attempted to call son Mena Goes listed on face sheet.  Voicemail is full.  Text sent as well.   1500: following outpt PT/OT/SLP providers called: [facility]: [phone]: LM [facility]: [phone] -PT only.  They provided contact for: [facility] system: [phone] [facility] Provider [place] outpt OT/PT/SLP) LM  Main number for [facility] system: [phone]    Expected Discharge Plan: Home/Self Care Barriers to Discharge: Continued Medical Work up  Expected Discharge Plan and Services Expected Discharge Plan: Home/Self Care     Post Acute Care Choice: NA Living arrangements for the past 2 months: Apartment                                       Social Determinants of Health (SDOH) Interventions    Readmission Risk Interventions No flowsheet data found.

## 2020-08-16 NOTE — Sedation Documentation (Signed)
Attempted to call report to 2W, RN not available.

## 2020-08-17 LAB — GLUCOSE, CAPILLARY
Glucose-Capillary: 149 mg/dL — ABNORMAL HIGH (ref 70–99)
Glucose-Capillary: 129 mg/dL — ABNORMAL HIGH (ref 70–99)
Glucose-Capillary: 101 mg/dL — ABNORMAL HIGH (ref 70–99)
Glucose-Capillary: 137 mg/dL — ABNORMAL HIGH (ref 70–99)

## 2020-08-17 MED ORDER — CLOPIDOGREL BISULFATE 75 MG PO TABS
75.0000 mg | ORAL_TABLET | Freq: Every day | ORAL | Status: DC
  Administered 2020-08-18: 75 mg via ORAL
  Filled 2020-08-17: qty 1

## 2020-08-17 NOTE — Progress Notes (Signed)
PROGRESS NOTE  Adam [name]  DOB: 28-Nov-1960  PCP: Patient, [name] [id number]  DOA: 08/03/2020  LOS: 13 days   Chief Complaint  Patient presents with  . Cerebrovascular Accident    Brief narrative: Adam [name] is a 59 y.o. year old male with medical history significant for HTN, prior stroke (residual right-sided weakness and dysarthria) who presented on 08/03/2020 with acute onset drooling, left arm/torso sensory changes, headache, bifacial weakness and worsening speech and gait. He was found to have acute right MCA territory infarcts.CTA of head showed high-grade focal distal stenosis within the distal A2/proximal A2 left anterior cerebral artery with high-grade focal stenosis in the P2 left PCA high-grade stenosis within the P4 right PCA.  CTA neck showed no significant stenosis.  TTE showed preserved EF.  LDL 80, A1c 5.7  Hospital course was complicated by persistent drooling and decreased p.o. intake requiring NG tube placement for tube feeds.   Now awaiting Plavix washout in order to have IR placed PEG tube on Friday 9/24  Subjective: Patient was seen and examined this morning.   Feels better after Dobbhoff was removed and PEG tube was placed yesterday. Tachycardic to 90s and 100s.  Blood pressure 90s. no new complaint.  Assessment/Plan: Acute right MCA territory infarcts, embolic event unclear source currently.   -TEE with preserved EF, no masses, no PFO -Does have some left-sided weakness but able to mobilize well and does not currently need CIR. at discharge home we will set up for home health PT -Per neurology, continue aspirin, Plavix x3 weeks followed by aspirin alone. -continue high intensity level statin  Increased drooling and dysphagia/hypophonia, unclear if this is residual deficit of recent stroke/apraxia or pseudobulbar state related to dysphagia.   -PEG tube feeding started.  Resume Plavix from tomorrow -Continue intensive speech therapy while  inpatient  B12 deficiency -in the setting of severe calorie protein malnutrition B12  -continue B12, thiamine, multivitamin supplementation Recent Labs    08/03/20 1427 08/03/20 2355 08/14/20 0224 08/15/20 0142  MCV   < >  --  74.6* 74.3*  VITAMINB12  --  90*  --   --   FOLATE  --  18.1  --   --    < > = values in this interval not displayed.   Hypertension -Currently blood pressure is in goal with lisinopril, HCTZ and amlodipine. Continue to monitor. -TSH normal. -Has mild tachycardia on last 24 hours. Continue to monitor.  AKI, resolved Baseline creatinine 0.9-1.  Peak creatinine 1.3, now back to baseline.  Like related to diminished oral intake.  Seems to be tolerating low-dose lisinopril well -Monitor BMP -Avoid nephrotoxins Recent Labs    08/03/20 1427 08/03/20 1438 08/07/20 1101 08/08/20 0135 08/09/20 0101 08/10/20 0443 08/15/20 0142  BUN 29* 30* 39* 45* 52* 48* 32*  CREATININE 1.10 0.90 1.30* 1.27* 1.31* 1.06 0.82   Hypokalemia -mild, resolved.  Recent Labs  Lab 08/10/20 1614 08/15/20 0142  K  --  4.0  MG 2.5* 2.3  PHOS 2.8 3.5   Severe malnutrition.   -Has fair muscle atrophy on examination -Given persistent drooling and difficulty swallowing even a liquid diet now on tube feeds via cor track  -Appreciate speech and nutrition consultations  Mobility: Encourage ambulation Code Status:   Code Status: Full Code  Nutritional status: Body mass index is 16.15 kg/m. Nutrition Problem: Severe Malnutrition Etiology: social / environmental circumstances Signs/Symptoms: severe muscle depletion, severe fat depletion, energy intake < or equal to 50% for > or  equal to 1 month Diet Order            Diet NPO time specified  Diet effective now                 DVT prophylaxis: enoxaparin (LOVENOX) injection 40 mg Start: 08/04/20 1000   Antimicrobials:  None Fluid: None Consultants: IR, neurology Family Communication:  None at bedside  Status is:  Inpatient  Remains inpatient appropriate because -PEG tube feeding started today.  Rate increased to the goal Dispo:  Patient From: Home  Planned Disposition: Home  Expected discharge date: In 1 to 2 days  medically stable for discharge: No   Infusions:  . feeding supplement (OSMOLITE 1.5 CAL) 1,000 mL (08/16/20 1151)    Scheduled Meds: . amLODipine  5 mg Per Tube Daily  . aspirin  81 mg Per Tube Daily   Or  . aspirin  300 mg Rectal Daily  . atorvastatin  80 mg Per Tube Daily  . enoxaparin (LOVENOX) injection  40 mg Subcutaneous Daily  . feeding supplement (PROSource TF)  45 mL Per Tube TID  . hydrochlorothiazide  25 mg Per Tube Daily  . lisinopril  10 mg Per Tube Daily  . multivitamin  15 mL Per Tube Daily  . thiamine  100 mg Per Tube Daily  . vitamin B-12  1,000 mcg Per Tube Daily    Antimicrobials: Anti-infectives (From admission, onward)   Start     Dose/Rate Route Frequency Ordered Stop   08/16/20 1015  ceFAZolin (ANCEF) IVPB 2g/100 mL premix        2 g 200 mL/hr over 30 Minutes Intravenous  Once 08/16/20 1002 08/16/20 1052      PRN meds: hydrALAZINE, morphine injection, Resource ThickenUp Clear, traMADol   Objective: Vitals:   08/16/20 2306 08/17/20 0749  BP: 93/75 116/88  Pulse: 99 100  Resp: 18 18  Temp: 98.5 F (36.9 C) 98.3 F (36.8 C)  SpO2: 100%     Intake/Output Summary (Last 24 hours) at 08/17/2020 1418 Last data filed at 08/17/2020 0800 Gross per 24 hour  Intake 1000 ml  Output 100 ml  Net 900 ml   Filed Weights   08/15/20 0500 08/16/20 0500 08/17/20 0500  Weight: 58.1 kg 58.1 kg 54 kg   Weight change: -4.1 kg Body mass index is 16.15 kg/m.   Physical Exam: General exam: Appears calm and comfortable.  Not in physical distress.  Thin built Skin: No rashes, lesions or ulcers. HEENT: Atraumatic, normocephalic, supple neck, no obvious bleeding.   Lungs: Clear to auscultation bilaterally CVS: Mild tachycardia, regular rhythm, no  murmur GI/Abd soft, nontender, nondistended, bowel sound present CNS: Alert, awake Psychiatry: Mood appropriate Extremities: No calf tenderness, no pedal edema  Data Review: I have personally reviewed the laboratory data and studies available.  Recent Labs  Lab 08/14/20 0224 08/15/20 0142  WBC 7.1 7.0  NEUTROABS  --  3.9  HGB 14.2 13.5  HCT 43.1 40.2  MCV 74.6* 74.3*  PLT 182 171   Recent Labs  Lab 08/10/20 1614 08/15/20 0142  NA  --  136  K  --  4.0  CL  --  99  CO2  --  28  GLUCOSE  --  119*  BUN  --  32*  CREATININE  --  0.82  CALCIUM  --  9.6  MG 2.5* 2.3  PHOS 2.8 3.5    F/u labs ordered  Signed, Lorin Glass, MD [facility] 08/17/2020

## 2020-08-17 NOTE — Progress Notes (Addendum)
Nutrition Follow-up  DOCUMENTATION CODES:   Severe malnutrition in context of social or environmental circumstances  INTERVENTION:  ContinueOsmolite1.5cal @ 32ml/hr via G-tube.  Provide 72ml Prosource TFTID per tube.   At goal, tube feeding provides2280kcals,123grams protein, 1062ml free water  Continue Vitamin B12 1035mcg po daily  Continue MVI daily  NUTRITION DIAGNOSIS:   Severe Malnutrition related to social / environmental circumstances as evidenced by severe muscle depletion, severe fat depletion, energy intake < or equal to 50% for > or equal to 1 month; ongoing  GOAL:   Patient will meet greater than or equal to 90% of their needs; met with TF  MONITOR:   TF tolerance, Skin, Weight trends, Labs, I & O's  REASON FOR ASSESSMENT:   Consult Assessment of nutrition requirement/status  ASSESSMENT:   Pt admitted for CVA. PMH includes HTN and prior CVA (2013).  9/16 s/p TEE Cortrak NGT has been removed.   RD consulted for tube feeding management and initiation. G-tube placed yesterday. IR has cleared G-tube for tube feeding initiation this AM. Per RN, TF resumed this morning and pt has been tolerating his tube feeds well thus far with no difficulties. RD to continue with current tube feeding orders. Labs and medications reviewed.   Diet Order:   Diet Order            Diet NPO time specified  Diet effective now                 EDUCATION NEEDS:   Education needs have been addressed  Skin:  Skin Assessment: Reviewed RN Assessment  Last BM:  9/23  Height:   Ht Readings from Last 1 Encounters:  08/14/20 6' (1.829 m)    Weight:   Wt Readings from Last 1 Encounters:  08/17/20 54 kg   BMI:  Body mass index is 16.15 kg/m.  Estimated Nutritional Needs:   Kcal:  2150-2350  Protein:  105-115 grams  Fluid:  >/=2L/d  Corrin Parker, MS, RD, LDN RD pager number/after hours weekend pager number on Amion.

## 2020-08-17 NOTE — Progress Notes (Signed)
Referring Physician(s): Dr. Dennison Nancy  Supervising Physician: Ruel Favors  Patient Status:  [facility] - In-pt  Chief Complaint: CVA; severe dysphagia. Gastrostomy tube placed by IR 08/16/20  Subjective: Patient in bed, tube feeds currently infusing. Patient complaining of some generalized pain in his abdomen, some tenderness around g-tube site. Site is unremarkable.   Allergies: Patient has no known allergies.  Medications: Prior to Admission medications   Not on File     Vital Signs: BP 116/88 (BP Location: Right Arm)   Pulse 100   Temp 98.3 F (36.8 C)   Resp 18   Ht 6' (1.829 m) Comment: pt stated  Wt 119 lb 0.8 oz (54 kg)   SpO2 100%   BMI 16.15 kg/m   Physical Exam Constitutional:      General: He is not in acute distress. Pulmonary:     Effort: Pulmonary effort is normal.  Abdominal:     Palpations: Abdomen is soft.     Tenderness: There is abdominal tenderness.     Comments: Gastrostomy tube in place, tube feeds currently infusion. Site is clean and dry. No erythema or drainage; minimal tenderness around site.   Skin:    General: Skin is warm and dry.  Neurological:     Mental Status: He is alert and oriented to person, place, and time.     Imaging: IR GASTROSTOMY TUBE MOD SED  Result Date: 08/16/2020 INDICATION: DYSPHAGIA, MALNUTRITION EXAM: FLUOROSCOPIC 20 FRENCH PULL-THROUGH GASTROSTOMY Date:  08/16/2020 08/16/2020 10:58 am Radiologist:  M. Ruel Favors, MD Guidance:  FLUOROSCOPIC MEDICATIONS: ANCEF 2 G; Antibiotics were administered within 1 hour of the procedure. GLUCAGON 0.5 MG IV ANESTHESIA/SEDATION: Versed 1.5 mg IV; Fentanyl 50 mcg IV Moderate Sedation Time:  15 MINUTES The patient was continuously monitored during the procedure by the interventional radiology nurse under my direct supervision. CONTRAST:  43mL OMNIPAQUE IOHEXOL 300 MG/ML SOLN - administered into the gastric lumen. FLUOROSCOPY TIME:  Fluoroscopy Time: 3 minutes 0 seconds (19 mGy).  COMPLICATIONS: NONE. PROCEDURE: Informed consent was obtained from the patient following explanation of the procedure, risks, benefits and alternatives. The patient understands, agrees and consents for the procedure. All questions were addressed. A time out was performed. Maximal barrier sterile technique utilized including caps, mask, sterile gowns, sterile gloves, large sterile drape, hand hygiene, and betadine prep. The left upper quadrant was sterilely prepped and draped. An oral gastric catheter was inserted into the stomach under fluoroscopy. The existing nasogastric feeding tube was removed. Air was injected into the stomach for insufflation and visualization under fluoroscopy. The air distended stomach was confirmed beneath the anterior abdominal wall in the frontal and lateral projections. Under sterile conditions and local anesthesia, a 17 gauge trocar needle was utilized to access the stomach percutaneously beneath the left subcostal margin. Needle position was confirmed within the stomach under biplane fluoroscopy. Contrast injection confirmed position also. A single T tack was deployed for gastropexy. Over an Amplatz guide wire, a 9-French sheath was inserted into the stomach. A snare device was utilized to capture the oral gastric catheter. The snare device was pulled retrograde from the stomach up the esophagus and out the oropharynx. The 20-French pull-through gastrostomy was connected to the snare device and pulled antegrade through the oropharynx down the esophagus into the stomach and then through the percutaneous tract external to the patient. The gastrostomy was assembled externally. Contrast injection confirms position in the stomach. Images were obtained for documentation. The patient tolerated procedure well. No immediate complication.  IMPRESSION: Fluoroscopic insertion of a 20-French "pull-through" gastrostomy. Electronically Signed   By: Judie Petit.  Shick M.D.   On: 08/16/2020 11:09     Labs:  CBC: Recent Labs    08/03/20 1427 08/03/20 1438 08/14/20 0224 08/15/20 0142  WBC 5.2  --  7.1 7.0  HGB 13.6 13.9 14.2 13.5  HCT 41.0 41.0 43.1 40.2  PLT 207  --  182 171    COAGS: Recent Labs    08/03/20 1427 08/14/20 0224  INR 1.1 1.0  APTT 32  --     BMP: Recent Labs    08/08/20 0135 08/09/20 0101 08/10/20 0443 08/15/20 0142  NA 141 140 141 136  K 3.3* 3.8 3.5 4.0  CL 104 106 106 99  CO2 23 22 23 28   GLUCOSE 93 92 98 119*  BUN 45* 52* 48* 32*  CALCIUM 9.8 9.9 9.5 9.6  CREATININE 1.27* 1.31* 1.06 0.82  GFRNONAA >60 59* >60 >60  GFRAA >60 >60 >60 >60    LIVER FUNCTION TESTS: Recent Labs    08/03/20 1427  BILITOT 0.8  AST 16  ALT 11  ALKPHOS 76  PROT 7.7  ALBUMIN 4.4    Assessment and Plan:  CVA; severe dysphagia - gastrostomy tube placed 08/16/20: Gastrostomy tube is functioning as expected and without complication.  IR will sign off.   Electronically Signed: 08/18/20, AGACNP-BC [phone] 08/17/2020, 1:59 PM   I spent a total of 15 Minutes at the the patient's bedside AND on the patient's hospital floor or unit, greater than 50% of which was counseling/coordinating care for gastrostomy tube evaluation.

## 2020-08-17 NOTE — Plan of Care (Signed)
  Problem: Coping: Goal: Level of anxiety will decrease Outcome: Progressing   Problem: Elimination: Goal: Will not experience complications related to urinary retention Outcome: Progressing   Problem: Safety: Goal: Ability to remain free from injury will improve Outcome: Progressing   Problem: Pain Managment: Goal: General experience of comfort will improve Outcome: Not Progressing  Pt complaining of abd pain related to PEG

## 2020-08-17 NOTE — Plan of Care (Signed)

## 2020-08-18 ENCOUNTER — Inpatient Hospital Stay (HOSPITAL_COMMUNITY): Payer: Medicaid Other

## 2020-08-18 LAB — GLUCOSE, CAPILLARY
Glucose-Capillary: 104 mg/dL — ABNORMAL HIGH (ref 70–99)
Glucose-Capillary: 105 mg/dL — ABNORMAL HIGH (ref 70–99)
Glucose-Capillary: 111 mg/dL — ABNORMAL HIGH (ref 70–99)
Glucose-Capillary: 131 mg/dL — ABNORMAL HIGH (ref 70–99)
Glucose-Capillary: 139 mg/dL — ABNORMAL HIGH (ref 70–99)
Glucose-Capillary: 145 mg/dL — ABNORMAL HIGH (ref 70–99)
Glucose-Capillary: 161 mg/dL — ABNORMAL HIGH (ref 70–99)

## 2020-08-18 MED ORDER — CARVEDILOL 3.125 MG PO TABS
3.1250 mg | ORAL_TABLET | Freq: Two times a day (BID) | ORAL | Status: DC
  Administered 2020-08-18 (×2): 3.125 mg
  Filled 2020-08-18 (×2): qty 1

## 2020-08-18 MED ORDER — AMLODIPINE BESYLATE 10 MG PO TABS
10.0000 mg | ORAL_TABLET | Freq: Every day | ORAL | Status: DC
  Administered 2020-08-18: 10 mg
  Filled 2020-08-18 (×2): qty 1

## 2020-08-18 MED ORDER — MORPHINE SULFATE (PF) 2 MG/ML IV SOLN
2.0000 mg | INTRAVENOUS | Status: DC | PRN
  Administered 2020-08-18 – 2020-08-20 (×2): 2 mg via INTRAVENOUS
  Filled 2020-08-18 (×3): qty 1

## 2020-08-18 MED ORDER — CLOPIDOGREL BISULFATE 75 MG PO TABS
75.0000 mg | ORAL_TABLET | Freq: Every day | ORAL | Status: DC
  Administered 2020-08-19 – 2020-08-21 (×3): 75 mg
  Filled 2020-08-18 (×3): qty 1

## 2020-08-18 NOTE — Care Management (Addendum)
Alerted by MD that patient is medically ready for DC. Order placed for tube feeds, requested MD to review and sign.  Verified w Jeri Modena from [employer] [facility] that they service [place]. Discussed with Tammy Sours CSW who will verify plan with brother tomorrow when he visits that he will be taking patient home to [place]. CSW will obtain address and provide it to [facility].  Received update from [facility]. She was able to reach the patient's daughter in law Daiva Nakayama [phone], who is a Consulting civil engineer. [place] is arranging with Pam a time to come in tomorrow for the family to get teaching. She states that the patient will be staying with his cousin in [place] at DC. [place] is working on getting the address for [place].

## 2020-08-18 NOTE — Progress Notes (Signed)
PROGRESS NOTE  Adam [name]  DOB: Mar 04, 1961  PCP: Patient, [name] ENI:[id number]  DOA: 08/03/2020  LOS: 14 days   Chief Complaint  Patient presents with  . Cerebrovascular Accident    Brief narrative: Adam [name] is a 59 y.o. year old male with medical history significant for HTN, prior stroke (residual right-sided weakness and dysarthria) who presented on 08/03/2020 with acute onset drooling, left arm/torso sensory changes, headache, bifacial weakness and worsening speech and gait. He was found to have acute right MCA territory infarcts.CTA of head showed high-grade focal distal stenosis within the distal A2/proximal A2 left anterior cerebral artery with high-grade focal stenosis in the P2 left PCA high-grade stenosis within the P4 right PCA.  CTA neck showed no significant stenosis.  TTE showed preserved EF.  LDL 80, A1c 5.7  Hospital course was complicated by persistent drooling and decreased p.o. intake requiring NG tube placement for tube feeds.   Now awaiting Plavix washout in order to have IR placed PEG tube on Friday 9/24  Subjective: Patient was seen and examined this morning.   Lying down in bed.  Not in distress.  Feels good. Heart rate 104, blood pressure in low 100s.  Assessment/Plan: Acute right MCA territory infarcts, embolic event unclear source currently.   -TEE with preserved EF, no masses, no PFO -Does have some left-sided weakness but able to mobilize well and does not currently need CIR. At discharge home we will set up for home health PT -Per neurology, continue aspirin, Plavix x3 weeks followed by aspirin alone. -continue high intensity level statin  Dysphagia -Secondary stroke.  Patient has increased drooling and dysphagia/hypophonia -PEG tube placed on 9/24.  Feeding started.  Plavix has been resumed. -Continue intensive speech therapy while inpatient  B12 deficiency -in the setting of severe calorie protein malnutrition B12  -continue B12,  thiamine, multivitamin supplementation Recent Labs    08/03/20 1427 08/03/20 2355 08/14/20 0224 08/15/20 0142  MCV   < >  --  74.6* 74.3*  VITAMINB12  --  90*  --   --   FOLATE  --  18.1  --   --    < > = values in this interval not displayed.   Hypertension Sinus tachycardia -Currently blood pressure is in goal with lisinopril, HCTZ and amlodipine. Continue to monitor. -TSH normal. -In order to address tachycardia, I stopped lisinopril HCTZ this morning, increased the dose of amlodipine and started the patient on Coreg 3.125 mg BID.  AKI, resolved Baseline creatinine 0.9-1. Peak creatinine 1.3, now back to baseline.  Like related to diminished oral intake.  Seems to be tolerating low-dose lisinopril well -Monitor BMP -Avoid nephrotoxins Recent Labs    08/03/20 1427 08/03/20 1438 08/07/20 1101 08/08/20 0135 08/09/20 0101 08/10/20 0443 08/15/20 0142  BUN 29* 30* 39* 45* 52* 48* 32*  CREATININE 1.10 0.90 1.30* 1.27* 1.31* 1.06 0.82   Hypokalemia -mild, resolved.  Recent Labs  Lab 08/15/20 0142  K 4.0  MG 2.3  PHOS 3.5   Severe malnutrition.   -Has fair muscle atrophy on examination -Given persistent drooling and difficulty swallowing even a liquid diet now on tube feeds via cor track  -Appreciate speech and nutrition consultations  Mobility: Encourage ambulation Code Status:   Code Status: Full Code  Nutritional status: Body mass index is 16.15 kg/m. Nutrition Problem: Severe Malnutrition Etiology: social / environmental circumstances Signs/Symptoms: severe muscle depletion, severe fat depletion, energy intake < or equal to 50% for > or equal to 1  month Diet Order            Diet NPO time specified  Diet effective now                 DVT prophylaxis: enoxaparin (LOVENOX) injection 40 mg Start: 08/04/20 1000   Antimicrobials:  None Fluid: None Consultants: IR, neurology Family Communication:  None at bedside  Status is: Inpatient  Remains  inpatient appropriate because -care management making arrangements for outpatient tube feeding, home health and PT services.   Dispo:  Patient From: Home  Planned Disposition: Home  Expected discharge date: Likely tomorrow  medically stable for discharge: No   Infusions:  . feeding supplement (OSMOLITE 1.5 CAL) 60 mL/hr at 08/17/20 2051    Scheduled Meds: . amLODipine  10 mg Per Tube Daily  . aspirin  81 mg Per Tube Daily   Or  . aspirin  300 mg Rectal Daily  . atorvastatin  80 mg Per Tube Daily  . carvedilol  3.125 mg Per Tube BID WC  . clopidogrel  75 mg Oral Daily  . enoxaparin (LOVENOX) injection  40 mg Subcutaneous Daily  . feeding supplement (PROSource TF)  45 mL Per Tube TID  . multivitamin  15 mL Per Tube Daily  . thiamine  100 mg Per Tube Daily  . vitamin B-12  1,000 mcg Per Tube Daily    Antimicrobials: Anti-infectives (From admission, onward)   Start     Dose/Rate Route Frequency Ordered Stop   08/16/20 1015  ceFAZolin (ANCEF) IVPB 2g/100 mL premix        2 g 200 mL/hr over 30 Minutes Intravenous  Once 08/16/20 1002 08/16/20 1052      PRN meds: hydrALAZINE, morphine injection, Resource ThickenUp Clear, traMADol   Objective: Vitals:   08/18/20 0105 08/18/20 0756  BP:  95/71  Pulse:  (!) 104  Resp: 17 16  Temp:  98.4 F (36.9 C)  SpO2:  98%    Intake/Output Summary (Last 24 hours) at 08/18/2020 1036 Last data filed at 08/18/2020 0754 Gross per 24 hour  Intake 847 ml  Output 350 ml  Net 497 ml   Filed Weights   08/15/20 0500 08/16/20 0500 08/17/20 0500  Weight: 58.1 kg 58.1 kg 54 kg   Weight change:  Body mass index is 16.15 kg/m.   Physical Exam: General exam: Appears calm and comfortable.  Not in physical distress.  Thin built Skin: No rashes, lesions or ulcers. HEENT: Atraumatic, normocephalic, supple neck, no obvious bleeding.   Lungs: Clear to auscultation bilaterally CVS: Mild tachycardia, regular rhythm, no murmur GI/Abd soft,  nontender, nondistended, bowel sound present.  PEG tube site intact.   CNS: Alert, awake Psychiatry: Mood appropriate Extremities: No calf tenderness, no pedal edema  Data Review: I have personally reviewed the laboratory data and studies available.  Recent Labs  Lab 08/14/20 0224 08/15/20 0142  WBC 7.1 7.0  NEUTROABS  --  3.9  HGB 14.2 13.5  HCT 43.1 40.2  MCV 74.6* 74.3*  PLT 182 171   Recent Labs  Lab 08/15/20 0142  NA 136  K 4.0  CL 99  CO2 28  GLUCOSE 119*  BUN 32*  CREATININE 0.82  CALCIUM 9.6  MG 2.3  PHOS 3.5    F/u labs ordered  Signed, Lorin Glass, MD [facility] 08/18/2020

## 2020-08-19 ENCOUNTER — Inpatient Hospital Stay (HOSPITAL_COMMUNITY): Payer: Medicaid Other

## 2020-08-19 LAB — CBC WITH DIFFERENTIAL/PLATELET
Abs Immature Granulocytes: 0.04 10*3/uL (ref 0.00–0.07)
Basophils Absolute: 0 10*3/uL (ref 0.0–0.1)
Basophils Relative: 0 %
Eosinophils Absolute: 0 10*3/uL (ref 0.0–0.5)
Eosinophils Relative: 0 %
HCT: 43.2 % (ref 39.0–52.0)
Hemoglobin: 14.3 g/dL (ref 13.0–17.0)
Immature Granulocytes: 0 %
Lymphocytes Relative: 16 %
Lymphs Abs: 1.7 10*3/uL (ref 0.7–4.0)
MCH: 24.6 pg — ABNORMAL LOW (ref 26.0–34.0)
MCHC: 33.1 g/dL (ref 30.0–36.0)
MCV: 74.2 fL — ABNORMAL LOW (ref 80.0–100.0)
Monocytes Absolute: 1.3 10*3/uL — ABNORMAL HIGH (ref 0.1–1.0)
Monocytes Relative: 12 %
Neutro Abs: 7.8 10*3/uL — ABNORMAL HIGH (ref 1.7–7.7)
Neutrophils Relative %: 72 %
Platelets: 225 10*3/uL (ref 150–400)
RBC: 5.82 MIL/uL — ABNORMAL HIGH (ref 4.22–5.81)
RDW: 14.6 % (ref 11.5–15.5)
WBC: 10.9 10*3/uL — ABNORMAL HIGH (ref 4.0–10.5)
nRBC: 0 % (ref 0.0–0.2)

## 2020-08-19 LAB — BASIC METABOLIC PANEL
Anion gap: 11 (ref 5–15)
BUN: 39 mg/dL — ABNORMAL HIGH (ref 6–20)
CO2: 26 mmol/L (ref 22–32)
Calcium: 10 mg/dL (ref 8.9–10.3)
Chloride: 101 mmol/L (ref 98–111)
Creatinine, Ser: 0.98 mg/dL (ref 0.61–1.24)
GFR calc Af Amer: >60 mL/min (ref >60)
GFR calc non Af Amer: >60 mL/min (ref >60)
Glucose, Bld: 129 mg/dL — ABNORMAL HIGH (ref 70–99)
Potassium: 4.6 mmol/L (ref 3.5–5.1)
Sodium: 138 mmol/L (ref 135–145)

## 2020-08-19 LAB — GLUCOSE, CAPILLARY
Glucose-Capillary: 161 mg/dL — ABNORMAL HIGH (ref 70–99)
Glucose-Capillary: 145 mg/dL — ABNORMAL HIGH (ref 70–99)
Glucose-Capillary: 151 mg/dL — ABNORMAL HIGH (ref 70–99)
Glucose-Capillary: 129 mg/dL — ABNORMAL HIGH (ref 70–99)

## 2020-08-19 LAB — MAGNESIUM: Magnesium: 2.3 mg/dL (ref 1.7–2.4)

## 2020-08-19 LAB — PHOSPHORUS: Phosphorus: 2.4 mg/dL — ABNORMAL LOW (ref 2.5–4.6)

## 2020-08-19 MED ORDER — IOHEXOL 300 MG/ML  SOLN
100.0000 mL | Freq: Once | INTRAMUSCULAR | Status: AC | PRN
  Administered 2020-08-19: 100 mL via INTRAVENOUS

## 2020-08-19 MED ORDER — CARVEDILOL 6.25 MG PO TABS
6.2500 mg | ORAL_TABLET | Freq: Two times a day (BID) | ORAL | Status: DC
  Administered 2020-08-19 – 2020-08-20 (×2): 6.25 mg
  Filled 2020-08-19 (×2): qty 1

## 2020-08-19 MED ORDER — IOHEXOL 9 MG/ML PO SOLN
500.0000 mL | ORAL | Status: DC
  Administered 2020-08-19: 500 mL via ORAL

## 2020-08-19 MED ORDER — K PHOS MONO-SOD PHOS DI & MONO 155-852-130 MG PO TABS
500.0000 mg | ORAL_TABLET | Freq: Once | ORAL | Status: AC
  Administered 2020-08-19: 500 mg
  Filled 2020-08-19: qty 2

## 2020-08-19 MED ORDER — K PHOS MONO-SOD PHOS DI & MONO 155-852-130 MG PO TABS: 500.0000 mg | ORAL_TABLET | Freq: Once | ORAL | Status: DC

## 2020-08-19 MED ORDER — SODIUM CHLORIDE 0.9 % IV SOLN
INTRAVENOUS | Status: DC
  Administered 2020-08-20 – 2020-08-21 (×2): 1 mL via INTRAVENOUS

## 2020-08-19 MED ORDER — IOHEXOL 9 MG/ML PO SOLN
500.0000 mL | ORAL | Status: AC
  Administered 2020-08-19: 500 mL

## 2020-08-19 MED ORDER — CARVEDILOL 3.125 MG PO TABS
3.1250 mg | ORAL_TABLET | Freq: Two times a day (BID) | ORAL | Status: DC
  Administered 2020-08-19: 3.125 mg
  Filled 2020-08-19: qty 1

## 2020-08-19 NOTE — TOC Progression Note (Signed)
Transition of Care [facility]) - Progression Note    Patient Details  Name: Adam [name] MRN: [id number] Date of Birth: 1961-07-24  [facility] CM/SW Contact  Lorri Frederick, LCSW Phone Number: 08/19/2020, 8:43 AM  Clinical Narrative:    CSW received call from sister [name].  Son Mena Goes, who was supposed to come to [place] and pick up pt today is now not coming and [name] is not sure what is going on.  She is not feeling comfortable with him going with son and would like to change the DC plan back to pt staying with her.  She is aware that she needs to be trained on the tube feeds.    Expected Discharge Plan: Home/Self Care Barriers to Discharge: Continued Medical Work up  Expected Discharge Plan and Services Expected Discharge Plan: Home/Self Care     Post Acute Care Choice: NA Living arrangements for the past 2 months: Apartment                                       Social Determinants of Health (SDOH) Interventions    Readmission Risk Interventions No flowsheet data found.

## 2020-08-19 NOTE — Progress Notes (Signed)
SLP Cancellation Note  Patient Details Name: Adam [name] MRN: [id number] DOB: 09-02-1961   Cancelled treatment:       Reason Eval/Treat Not Completed: Patient unavailable - currently with nursing being cleaned up. Will continue efforts.  Adam [name] B. Murvin Natal, [facility], CCC-SLP Speech Language Pathologist Office: [phone]  Leigh Aurora 08/19/2020, 10:27 AM

## 2020-08-19 NOTE — Progress Notes (Addendum)
MD paged to make aware of contrast arrival for oral use. CT MD and  pharmacist stated ok to use per tube.

## 2020-08-19 NOTE — Progress Notes (Signed)
PROGRESS NOTE  Adam [name]  DOB: 1961-01-09  PCP: Patient, [name] [id number]  DOA: 08/03/2020  LOS: 15 days   Chief Complaint  Patient presents with  . Cerebrovascular Accident    Brief narrative: Adam [name] is a 59 y.o. year old male with medical history significant for HTN, prior stroke (residual right-sided weakness and dysarthria) who presented on 08/03/2020 with acute onset drooling, left arm/torso sensory changes, headache, bifacial weakness and worsening speech and gait. He was found to have acute right MCA territory infarcts.CTA of head showed high-grade focal distal stenosis within the distal A2/proximal A2 left anterior cerebral artery with high-grade focal stenosis in the P2 left PCA high-grade stenosis within the P4 right PCA.  CTA neck showed no significant stenosis.  TTE showed preserved EF.  LDL 80, A1c 5.7  Hospital course was complicated by persistent drooling and decreased p.o. intake requiring NG tube placement for tube feeds.   Now awaiting Plavix washout in order to have IR placed PEG tube on Friday 9/24  Subjective: Patient was seen and examined this morning.   Lying on bed.  Complains of abdominal pain.  Tender in the left lower quadrant. Patient has remained tachycardic all night and this morning.  Assessment/Plan: Abdominal pain/tachycardia -Suspected placement, patient is not complaining of abdominal pain, has tenderness in the left lower quadrant.  PEG tube site seems intact though.  He also has persistent tachycardia. -No fever.  Last bowel movement 2 days ago. -Abdominal x-ray done on 9/26 did not show any obstruction.  Obtain a CT scan of abdomen. -Increase Coreg dose to 6.25 mg daily. -Start on normal saline at 75 mL/h. -PEG tube feeding currently at a lower rate of 30 mill per hour. -Continue pain medicines.  Acute right MCA territory infarcts, embolic event unclear source currently.   -Continues to have some left-sided weakness but  able to mobilize well and does not currently need CIR. At discharge home we will set up for home health PT -Per neurology, continue aspirin, Plavix x3 weeks followed by aspirin alone, starting Aug 25, 2020. -continue high intensity level statin.  Dysphagia -Secondary to stroke -PEG tube placed on 9/24.  Feeding started.  Plavix has been resumed. -Continue intensive speech therapy while inpatient  B12 deficiency -in the setting of severe calorie protein malnutrition B12  -continue B12, thiamine, multivitamin supplementation Recent Labs    08/03/20 2355 08/14/20 0224 08/15/20 0142 08/19/20 0227  MCV  --    < > 74.3* 74.2*  VITAMINB12 90*  --   --   --   FOLATE 18.1  --   --   --    < > = values in this interval not displayed.   Hypertension Sinus tachycardia -Currently blood pressure is in goal with lisinopril, HCTZ and amlodipine. Continue to monitor. -TSH normal. -In order to address tachycardia, I stopped lisinopril HCTZ this morning, increased the dose of amlodipine and started the patient on Coreg 3.125 mg BID.  AKI, resolved Baseline creatinine 0.9-1. Peak creatinine 1.3, now back to baseline.  Like related to diminished oral intake.  Seems to be tolerating low-dose lisinopril well -Monitor BMP -Avoid nephrotoxins Recent Labs    08/03/20 1427 08/03/20 1438 08/07/20 1101 08/08/20 0135 08/09/20 0101 08/10/20 0443 08/15/20 0142 08/19/20 0227  BUN 29* 30* 39* 45* 52* 48* 32* 39*  CREATININE 1.10 0.90 1.30* 1.27* 1.31* 1.06 0.82 0.98   Hypokalemia/hypophosphatemia -Phosphorus level low at 2.4 today.  Replacement ordered. Recent Labs  Lab 08/15/20 0142  08/19/20 0227  K 4.0 4.6  MG 2.3 2.3  PHOS 3.5 2.4*   Severe malnutrition.   -Has fair muscle atrophy on examination -Given persistent drooling and difficulty swallowing even a liquid diet now on tube feeds via cor track  -Appreciate speech and nutrition consultations  Mobility: Encourage ambulation Code  Status:   Code Status: Full Code  Nutritional status: Body mass index is 16.15 kg/m. Nutrition Problem: Severe Malnutrition Etiology: social / environmental circumstances Signs/Symptoms: severe muscle depletion, severe fat depletion, energy intake < or equal to 50% for > or equal to 1 month Diet Order            Diet NPO time specified  Diet effective now                 DVT prophylaxis: enoxaparin (LOVENOX) injection 40 mg Start: 08/04/20 1000   Antimicrobials:  None Fluid: None Consultants: IR, neurology Family Communication:  None at bedside  Status is: Inpatient  Remains inpatient appropriate because -care management making arrangements for outpatient tube feeding, home health and PT services.   Dispo:  Patient From:    Planned Disposition: Home  Expected discharge date: Likely tomorrow  medically stable for discharge:     Infusions:  . sodium chloride 75 mL/hr at 08/19/20 1330  . feeding supplement (OSMOLITE 1.5 CAL) 60 mL/hr at 08/18/20 1100    Scheduled Meds: . aspirin  81 mg Per Tube Daily   Or  . aspirin  300 mg Rectal Daily  . atorvastatin  80 mg Per Tube Daily  . carvedilol  6.25 mg Per Tube BID WC  . clopidogrel  75 mg Per Tube Daily  . enoxaparin (LOVENOX) injection  40 mg Subcutaneous Daily  . feeding supplement (PROSource TF)  45 mL Per Tube TID  . multivitamin  15 mL Per Tube Daily  . phosphorus  500 mg Oral Once  . thiamine  100 mg Per Tube Daily  . vitamin B-12  1,000 mcg Per Tube Daily    Antimicrobials: Anti-infectives (From admission, onward)   Start     Dose/Rate Route Frequency Ordered Stop   08/16/20 1015  ceFAZolin (ANCEF) IVPB 2g/100 mL premix        2 g 200 mL/hr over 30 Minutes Intravenous  Once 08/16/20 1002 08/16/20 1052     PRN meds: hydrALAZINE, morphine injection, Resource ThickenUp Clear, traMADol   Objective: Vitals:   08/19/20 0743 08/19/20 0835  BP: 127/80 108/81  Pulse:  (!) 127  Resp: 17 12  Temp:  99.4 F  (37.4 C)  SpO2:  100%    Intake/Output Summary (Last 24 hours) at 08/19/2020 1339 Last data filed at 08/19/2020 0841 Gross per 24 hour  Intake --  Output 50 ml  Net -50 ml   Filed Weights   08/15/20 0500 08/16/20 0500 08/17/20 0500  Weight: 58.1 kg 58.1 kg 54 kg   Weight change:  Body mass index is 16.15 kg/m.   Physical Exam: General exam: Appears calm and comfortable. Not in physical distress. Thin built Skin: No rashes, lesions or ulcers. HEENT: Atraumatic, normocephalic, supple neck, no obvious bleeding.   Lungs: Clear to auscultation bilaterally CVS: Persistent tachycardia, regular rhythm, no murmur GI/Abd soft, tenderness present in left lower quadrant, nondistended, bowel sound present.  PEG tube site intact.   CNS: Alert, awake Psychiatry: Mood appropriate Extremities: No calf tenderness, no pedal edema  Data Review: I have personally reviewed the laboratory data and studies available.  Recent Labs  Lab 08/14/20 0224 08/15/20 0142 08/19/20 0227  WBC 7.1 7.0 10.9*  NEUTROABS  --  3.9 7.8*  HGB 14.2 13.5 14.3  HCT 43.1 40.2 43.2  MCV 74.6* 74.3* 74.2*  PLT 182 171 225   Recent Labs  Lab 08/15/20 0142 08/19/20 0227  NA 136 138  K 4.0 4.6  CL 99 101  CO2 28 26  GLUCOSE 119* 129*  BUN 32* 39*  CREATININE 0.82 0.98  CALCIUM 9.6 10.0  MG 2.3 2.3  PHOS 3.5 2.4*   F/u labs ordered  Signed, Lorin Glass, MD [facility] 08/19/2020

## 2020-08-19 NOTE — Progress Notes (Signed)
  Speech Language Pathology Treatment: Dysphagia;Cognitive-Linquistic  Patient Details Name: Adam [name] MRN: [id number] DOB: 11/20/1961 Today's Date: 08/19/2020 Time: 7169-6789 SLP Time Calculation (min) (ACUTE ONLY): 25 min  Assessment / Plan / Recommendation Clinical Impression  Pt seen at bedside for skilled ST intervention targeting goals for swallow and dysarthria. Pt was alert and cooperative with unfamiliar therapist. Communication board was brought in for pt to use, to maximize effective communication of wants and needs. Pt was able to identify pain 2/10 using communication board. Oral care was completed with suction, during which, pt exhibited swallow reflex x1. Following oral care, pt was given individual ice chips. No oral manipulation was observed, but also no anterior leakage. Without cues, pt maintained open mouth posture, and required over 3 minutes to initiate swallow reflex. Pt was then given visual and verbal cues to close lips, pt was able to generate swallow within 30 seconds. Pt requires encouragement to increase vocal intensity when producing vocalization. Continued ST intervention is recommended to maximize swallow function and safety, and to facilitate effective communication of wants and needs.   Used oral swab was soaking in water upon arrival of SLP. Pt was encouraged to use swabs once and throw them away, not allow them to sit in water after use to minimize bacterial growth. RN present during this education.   HPI HPI: Patient is a 59 y.o. 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 Per chart review, his sister had reported she noticed bifacial weakness, significant worsened speech and significantly worsened gait. MRI brain revealed patchy small volume acute right MCA territory infarcts involving right frontoparietal region but no  associated hemorrhage or mass effect, as well as underlying advanced cerebral atrophy and chronic microvascular ischemic disease, multiple remote lacunar infarcts about the basal ganglia and right cerebellum.      SLP Plan  Continue with current plan of care       Recommendations  Diet recommendations:  (ice chips) Liquids provided via: Teaspoon Medication Administration: Via alternative means Supervision: Patient able to self feed Compensations: Minimize environmental distractions;Slow rate;Small sips/bites;Lingual sweep for clearance of pocketing;Monitor for anterior loss Postural Changes and/or Swallow Maneuvers: Seated upright 90 degrees                Oral Care Recommendations: Oral care QID;Oral care prior to ice chip/H20 Follow up Recommendations: Home health SLP SLP Visit Diagnosis: Dysarthria and anarthria (R47.1);Dysphagia, unspecified (R13.10) Plan: Continue with current plan of care       GO              Adam [name] B. Murvin Natal, [facility], CCC-SLP Speech Language Pathologist Office: [phone]  Adam [name] 08/19/2020, 1:35 PM

## 2020-08-19 NOTE — Progress Notes (Signed)
Physical Therapy Treatment Patient Details Name: Adam [name] MRN: [id number] DOB: [birth year] Today's Date: 08/19/2020    History of Present Illness 59 y.o. male with medical history significant for essential hypertension, prior CVA in 2013, seen in [place], who presented to [facility] ED from home with complaints of left arm numbness and drooling. MRI revealed patchy small volume acute right MCA territory infarcts involving the right frontoparietal region and multiple remote lacunar infarcts about the bilateral basal ganglia and right cerebellum.    PT Comments    Pt reporting fatigue and abdominal pain secondary to G-tube, but agreeable to OOB mobility. Pt ambulated hallway distance, requiring use of IV pole to steady with intermittent standing rest breaks to recover fatigue. Pt continues to require cuing for increasing foot clearance and large steps during gait, but responds well to cues. PT to continue to follow acutely.  Of note, pt's tube feeds running but not connected this day, pt stating he did not unplug it. RN notified and in room upon PT exit.      Follow Up Recommendations  Supervision for mobility/OOB;Outpatient PT     Equipment Recommendations  Other (comment) (refusing gait assistive device)    Recommendations for Other Services       Precautions / Restrictions Precautions Precautions: Fall Precaution Comments: L inattention Restrictions Weight Bearing Restrictions: No    Mobility  Bed Mobility Overal bed mobility: Needs Assistance Bed Mobility: Supine to Sit;Sit to Supine     Supine to sit: Supervision;HOB elevated Sit to supine: Supervision;HOB elevated   General bed mobility comments: supervision for safety, increased time  Transfers Overall transfer level: Needs assistance Equipment used: None Transfers: Sit to/from Stand Sit to Stand: Min guard         General transfer comment: for safety, slow to rise this day  Ambulation/Gait Ambulation/Gait  assistance: Min guard Gait Distance (Feet): 190 Feet Assistive device: IV Pole Gait Pattern/deviations: Step-through pattern;Decreased stride length;Shuffle;Narrow base of support;Decreased dorsiflexion - right;Decreased dorsiflexion - left Gait velocity: decr   General Gait Details: min gaurd for safety, balance. Repeated verbal cuing for increased foot clearance, responds best to cue "big steps" especially on L. Additional verbal cuing for upright posture as pt hunching due to abdominal pain. Pt with use of IV pole to steady self today due to pt-reported fatigue, x2 standing rest breaks.   Stairs             Wheelchair Mobility    Modified Rankin (Stroke Patients Only) Modified Rankin (Stroke Patients Only) Pre-Morbid Rankin Score: Slight disability Modified Rankin: Moderately severe disability     Balance Overall balance assessment: Needs assistance   Sitting balance-Leahy Scale: Good Sitting balance - Comments: posterior and L leaning with donning socks and shoes     Standing balance-Leahy Scale: Fair                              Cognition Arousal/Alertness: Awake/alert Behavior During Therapy: Flat affect Overall Cognitive Status: Impaired/Different from baseline Area of Impairment: Attention;Safety/judgement;Awareness;Problem solving                   Current Attention Level: Selective Memory: Decreased short-term memory Following Commands: Follows one step commands with increased time Safety/Judgement: Decreased awareness of safety;Decreased awareness of deficits Awareness: Emergent Problem Solving: Slow processing        Exercises      General Comments        Pertinent Vitals/Pain Pain  Assessment: Faces Faces Pain Scale: Hurts even more Pain Location: abdomen, from G tube Pain Descriptors / Indicators: Discomfort Pain Intervention(s): Limited activity within patient's tolerance;Monitored during session;Repositioned    Home  Living                      Prior Function            PT Goals (current goals can now be found in the care plan section) Acute Rehab PT Goals Patient Stated Goal: go home PT Goal Formulation: With patient Time For Goal Achievement: 09/02/20 Potential to Achieve Goals: Good Progress towards PT goals: Progressing toward goals    Frequency    Min 4X/week      PT Plan Current plan remains appropriate    Co-evaluation              AM-PAC PT "6 Clicks" Mobility   Outcome Measure  Help needed turning from your back to your side while in a flat bed without using bedrails?: None Help needed moving from lying on your back to sitting on the side of a flat bed without using bedrails?: None Help needed moving to and from a bed to a chair (including a wheelchair)?: None Help needed standing up from a chair using your arms (e.g., wheelchair or bedside chair)?: None Help needed to walk in hospital room?: A Little Help needed climbing 3-5 steps with a railing? : A Little 6 Click Score: 22    End of Session Equipment Utilized During Treatment: Gait belt Activity Tolerance: Patient limited by fatigue Patient left: with call bell/phone within reach;in bed;with nursing/sitter in room Nurse Communication: Mobility status PT Visit Diagnosis: Unsteadiness on feet (R26.81);Difficulty in walking, not elsewhere classified (R26.2)     Time: 1630-1650 PT Time Calculation (min) (ACUTE ONLY): 20 min  Charges:  $Gait Training: 8-22 mins                     Nyree Yonker E, PT Acute Rehabilitation Services Pager [phone]  Office [phone]    Tyrone Apple D Despina Hidden 08/19/2020, 5:29 PM

## 2020-08-19 NOTE — Progress Notes (Signed)
OT Cancellation Note  Patient Details Name: Adam [name] MRN: [id number] DOB: 1961-08-03   Cancelled Treatment:     Pt working with speech upon therapist arrival, will re-attempt as time allows.   Pollyann Glen E. Sabriyah Wilcher, COTA/L Acute Rehabilitation Services [phone]  Cherlyn Cushing 08/19/2020, 1:19 PM

## 2020-08-19 NOTE — Progress Notes (Signed)
MD made aware of tele report of heart rate, recurrent pain llq abd and MEWs score. Awaiting response r/t need for increased vs monitoring and/or pain management modification.

## 2020-08-20 LAB — PROCALCITONIN: Procalcitonin: 0.21 ng/mL

## 2020-08-20 LAB — CBC WITH DIFFERENTIAL/PLATELET
Abs Immature Granulocytes: 0.07 10*3/uL (ref 0.00–0.07)
Basophils Absolute: 0 10*3/uL (ref 0.0–0.1)
Basophils Relative: 0 %
Eosinophils Absolute: 0 10*3/uL (ref 0.0–0.5)
Eosinophils Relative: 0 %
HCT: 36 % — ABNORMAL LOW (ref 39.0–52.0)
Hemoglobin: 12.3 g/dL — ABNORMAL LOW (ref 13.0–17.0)
Immature Granulocytes: 1 %
Lymphocytes Relative: 10 %
Lymphs Abs: 1.3 10*3/uL (ref 0.7–4.0)
MCH: 25.1 pg — ABNORMAL LOW (ref 26.0–34.0)
MCHC: 34.2 g/dL (ref 30.0–36.0)
MCV: 73.5 fL — ABNORMAL LOW (ref 80.0–100.0)
Monocytes Absolute: 1.4 10*3/uL — ABNORMAL HIGH (ref 0.1–1.0)
Monocytes Relative: 10 %
Neutro Abs: 10.6 10*3/uL — ABNORMAL HIGH (ref 1.7–7.7)
Neutrophils Relative %: 79 %
Platelets: 201 10*3/uL (ref 150–400)
RBC: 4.9 MIL/uL (ref 4.22–5.81)
RDW: 14.7 % (ref 11.5–15.5)
WBC: 13.4 10*3/uL — ABNORMAL HIGH (ref 4.0–10.5)
nRBC: 0 % (ref 0.0–0.2)

## 2020-08-20 LAB — BASIC METABOLIC PANEL
Anion gap: 11 (ref 5–15)
BUN: 24 mg/dL — ABNORMAL HIGH (ref 6–20)
CO2: 23 mmol/L (ref 22–32)
Calcium: 9.2 mg/dL (ref 8.9–10.3)
Chloride: 100 mmol/L (ref 98–111)
Creatinine, Ser: 0.84 mg/dL (ref 0.61–1.24)
GFR calc Af Amer: >60 mL/min (ref >60)
GFR calc non Af Amer: >60 mL/min (ref >60)
Glucose, Bld: 123 mg/dL — ABNORMAL HIGH (ref 70–99)
Potassium: 4.3 mmol/L (ref 3.5–5.1)
Sodium: 134 mmol/L — ABNORMAL LOW (ref 135–145)

## 2020-08-20 LAB — PHOSPHORUS: Phosphorus: 2.8 mg/dL (ref 2.5–4.6)

## 2020-08-20 LAB — GLUCOSE, CAPILLARY
Glucose-Capillary: 118 mg/dL — ABNORMAL HIGH (ref 70–99)
Glucose-Capillary: 109 mg/dL — ABNORMAL HIGH (ref 70–99)
Glucose-Capillary: 128 mg/dL — ABNORMAL HIGH (ref 70–99)

## 2020-08-20 LAB — LACTIC ACID, PLASMA: Lactic Acid, Venous: 1.3 mmol/L (ref 0.5–1.9)

## 2020-08-20 MED ORDER — SODIUM CHLORIDE 0.9 % IV SOLN
2.0000 g | Freq: Two times a day (BID) | INTRAVENOUS | Status: DC
  Filled 2020-08-20 (×4): qty 2

## 2020-08-20 MED ORDER — VANCOMYCIN HCL IN DEXTROSE 1-5 GM/200ML-% IV SOLN
1000.0000 mg | Freq: Once | INTRAVENOUS | Status: AC
  Administered 2020-08-20: 1000 mg via INTRAVENOUS
  Filled 2020-08-20: qty 200

## 2020-08-20 MED ORDER — POLYETHYLENE GLYCOL 3350 17 G PO PACK: 17.0000 g | PACK | Freq: Every day | ORAL | Status: DC | PRN

## 2020-08-20 MED ORDER — VANCOMYCIN HCL 750 MG/150ML IV SOLN: 750.0000 mg | INTRAVENOUS | Status: DC

## 2020-08-20 MED ORDER — SENNOSIDES-DOCUSATE SODIUM 8.6-50 MG PO TABS
1.0000 | ORAL_TABLET | Freq: Every day | ORAL | Status: DC
  Administered 2020-08-20: 1 via ORAL
  Filled 2020-08-20: qty 1

## 2020-08-20 MED ORDER — PIPERACILLIN-TAZOBACTAM 3.375 G IVPB
3.3750 g | Freq: Three times a day (TID) | INTRAVENOUS | Status: DC
  Administered 2020-08-20 – 2020-08-21 (×3): 3.375 g via INTRAVENOUS
  Filled 2020-08-20 (×3): qty 50

## 2020-08-20 MED ORDER — CARVEDILOL 12.5 MG PO TABS
12.5000 mg | ORAL_TABLET | Freq: Two times a day (BID) | ORAL | Status: DC
  Administered 2020-08-20 – 2020-08-21 (×2): 12.5 mg
  Filled 2020-08-20 (×2): qty 1

## 2020-08-20 NOTE — TOC Progression Note (Signed)
Transition of Care [facility]) - Progression Note    Patient Details  Name: Adam [name] MRN: [id number] Date of Birth: [birth year]  Transition of Care [facility]) CM/SW Contact  Lorri Frederick, LCSW Phone Number: 08/20/2020, 8:54 AM  Clinical Narrative:   CSW spoke with pt sister re: pt not DC today.  She was planning to meet [employer] today when she picked pt up for tube feeding training but would prefer to wait if pt will not DC today.  Jeri Modena at [employer] notified.    Expected Discharge Plan: Home/Self Care Barriers to Discharge: Continued Medical Work up  Expected Discharge Plan and Services Expected Discharge Plan: Home/Self Care     Post Acute Care Choice: NA Living arrangements for the past 2 months: Apartment                                       Social Determinants of Health (SDOH) Interventions    Readmission Risk Interventions No flowsheet data found.

## 2020-08-20 NOTE — Progress Notes (Addendum)
Occupational Therapy Treatment Patient Details Name: Adam [name] MRN: [id number] DOB: 1961-02-19 Today's Date: 08/20/2020    History of present illness 59 y.o. male with medical history significant for essential hypertension, prior CVA in 2013, seen in [place], who presented to [facility] ED from home with complaints of left arm numbness and drooling. MRI revealed patchy small volume acute right MCA territory infarcts involving the right frontoparietal region and multiple remote lacunar infarcts about the bilateral basal ganglia and right cerebellum. CT scan abdomen 9/27 reveals R lower lobe PNA.    OT comments  Patient supine in bed and agreeable to OT session.  Patient distracted during session by abdominal pain, from gtube.  Engaged in ADL planning and 4 step task; able to verbalize items need to complete bathing at sink, then given task of (1) locating items, (2) washing face/arms, (3) applying deodorant, and (4) applying lotion to B LEs (pt using list provided to aide cognition).  Patient able to locate all items at sink (even on L side) without cueing, but min cueing to correctly identify each item. Min cueing to sequence through tasks and fatigues with sustained standing, but completing with min guard to supervision.  Noted HR up to 120 with standing ADLs at sink. Updated goals today, met 1/5. Patient will continue to benefit from acute OT services and outpatient neuro OT at dc.  Recommend 24/7 assist.    Follow Up Recommendations  Outpatient OT;Supervision/Assistance - 24 hour (neuro outpt )    Equipment Recommendations  Tub/shower seat    Recommendations for Other Services      Precautions / Restrictions Precautions Precautions: Fall Precaution Comments: L inattention Restrictions Weight Bearing Restrictions: No       Mobility Bed Mobility Overal bed mobility: Needs Assistance Bed Mobility: Supine to Sit     Supine to sit: Supervision;HOB elevated     General bed mobility  comments: supervision for safety and line mgmt   Transfers Overall transfer level: Needs assistance Equipment used: None Transfers: Sit to/from Stand Sit to Stand: Supervision         General transfer comment: close supervision for safety, cueing for hand placement    Balance Overall balance assessment: Needs assistance Sitting-balance support: No upper extremity supported;Feet supported Sitting balance-Leahy Scale: Good     Standing balance support: No upper extremity supported;Single extremity supported;During functional activity Standing balance-Leahy Scale: Fair Standing balance comment: dynamically preference to UE support                            ADL either performed or assessed with clinical judgement   ADL Overall ADL's : Needs assistance/impaired     Grooming: Wash/dry face;Applying deodorant;Supervision/safety;Set up;Sitting;Standing Grooming Details (indicate cue type and reason): initated standing, fatigued and completes sitting.  min cueing to sequence through tasks.        Lower Body Bathing Details (indicate cue type and reason): applied lotion to BLEs with supervision seated      Lower Body Dressing: Supervision/safety;Sit to/from stand Lower Body Dressing Details (indicate cue type and reason): supervision for safety in standing, donning shoes with supervision  Toilet Transfer: Min guard;Supervision/safety;Ambulation Toilet Transfer Details (indicate cue type and reason): using IV pole, simulated in room; cueing for safety and line mgmt          Functional mobility during ADLs: Supervision/safety General ADL Comments: pt limited by abdominal pain, cognition, balance and limited activity tolerance      Vision  Perception     Praxis      Cognition Arousal/Alertness: Awake/alert Behavior During Therapy: Flat affect Overall Cognitive Status: Impaired/Different from baseline Area of Impairment:  Attention;Safety/judgement;Awareness;Problem solving;Memory;Following commands                   Current Attention Level: Selective Memory: Decreased short-term memory Following Commands: Follows one step commands with increased time;Follows multi-step commands inconsistently Safety/Judgement: Decreased awareness of safety;Decreased awareness of deficits Awareness: Emergent Problem Solving: Slow processing;Requires verbal cues General Comments: following simple commands for ADLs with supervision, engaged in 4 ADL tasks at sink (therapist wrote list for pt to refer to) with min cueing to sequence through tasks.  Cueing for safety awareness with fatigue, able to locate items on L side without cueing, but requires min support to pick correct item and sequence tasks. Distracted during session by abdominal pain.        Exercises     Shoulder Instructions       General Comments      Pertinent Vitals/ Pain       Pain Assessment: Faces Faces Pain Scale: Hurts even more Pain Location: abdomen, from G tube Pain Descriptors / Indicators: Discomfort Pain Intervention(s): Monitored during session;Limited activity within patient's tolerance;Repositioned  Home Living                                          Prior Functioning/Environment              Frequency  Min 2X/week        Progress Toward Goals  OT Goals(current goals can now be found in the care plan section)  Progress towards OT goals: Progressing toward goals  Acute Rehab OT Goals Patient Stated Goal: go home OT Goal Formulation: With patient ADL Goals Pt Will Transfer to Toilet: with modified independence;ambulating;regular height toilet Pt Will Perform Tub/Shower Transfer: Tub transfer;with modified independence;shower seat;ambulating Additional ADL Goal #1: Pt will follow 3 step command during ADL with supervision. Additional ADL Goal #2: Pt will perform 4 part trail making task with min  cues. Additional ADL Goal #3: Pt will perform simple IADL with min cues.  Plan Discharge plan remains appropriate;Frequency remains appropriate    Co-evaluation                 AM-PAC OT "6 Clicks" Daily Activity     Outcome Measure   Help from another person eating meals?: Total Help from another person taking care of personal grooming?: A Little Help from another person toileting, which includes using toliet, bedpan, or urinal?: A Little Help from another person bathing (including washing, rinsing, drying)?: A Little Help from another person to put on and taking off regular upper body clothing?: A Little Help from another person to put on and taking off regular lower body clothing?: A Little 6 Click Score: 16    End of Session    OT Visit Diagnosis: Unsteadiness on feet (R26.81);Other abnormalities of gait and mobility (R26.89);Muscle weakness (generalized) (M62.81);Other symptoms and signs involving cognitive function;Pain Pain - part of body:  (stomach- gtube)   Activity Tolerance Patient tolerated treatment well   Patient Left in chair;with call bell/phone within reach;with chair alarm set;with nursing/sitter in room   Nurse Communication Mobility status        Time: 3335-4562 OT Time Calculation (min): 22 min  Charges: OT General Charges $OT Visit:  1 Visit OT Treatments $Self Care/Home Management : 8-22 mins  [place] Pager [phone] Office [phone]    Delight Stare 08/20/2020, 2:20 PM

## 2020-08-20 NOTE — Progress Notes (Signed)
Physical Therapy Treatment Patient Details Name: Adam [name] MRN: [id number] DOB: 1961/02/26 Today's Date: 08/20/2020    History of Present Illness Pt is 59 y.o. male with medical history significant for essential hypertension, prior CVA in 2013, seen in [place], who presented to [facility] ED from home with complaints of left arm numbness and drooling. MRI revealed patchy small volume acute right MCA territory infarcts involving the right frontoparietal region and multiple remote lacunar infarcts about the bilateral basal ganglia and right cerebellum. CT scan abdomen 9/27 reveals R lower lobe PNA.    PT Comments    Pt with limited treatment today due to c/o pain from abdomen (PEG site) and seen at end of day and fatigued.  He required increased cues for safety with lines/leads and cues for increased step length and BOS with ambulation.  Cont POC.     Follow Up Recommendations  Supervision for mobility/OOB;Outpatient PT     Equipment Recommendations  Other (comment) (refusing gait AD)    Recommendations for Other Services       Precautions / Restrictions Precautions Precautions: Fall Precaution Comments: L inattention Restrictions Weight Bearing Restrictions: No    Mobility  Bed Mobility Overal bed mobility: Needs Assistance Bed Mobility: Supine to Sit;Sit to Supine     Supine to sit: Min assist Sit to supine: Supervision   General bed mobility comments: Min A to scoot forward  Transfers Overall transfer level: Needs assistance Equipment used: None Transfers: Sit to/from Stand Sit to Stand: Min assist         General transfer comment: Pt required cues for safety and min A for steadying  Ambulation/Gait Ambulation/Gait assistance: Min guard Gait Distance (Feet): 70 Feet Assistive device: IV Pole Gait Pattern/deviations: Step-through pattern;Decreased stride length;Shuffle;Narrow base of support;Decreased dorsiflexion - right;Decreased dorsiflexion -  left;Scissoring     General Gait Details: Min guard for safety and balance.  Frequent verbal cueing for increased step length and widened BOS.  Distance limited due to abdominal pain   Stairs             Wheelchair Mobility    Modified Rankin (Stroke Patients Only)       Balance Overall balance assessment: Needs assistance Sitting-balance support: No upper extremity supported;Feet supported Sitting balance-Leahy Scale: Good     Standing balance support: Single extremity supported;During functional activity Standing balance-Leahy Scale: Poor Standing balance comment: required IV pole for stability                            Cognition Arousal/Alertness: Awake/alert Behavior During Therapy: Impulsive Overall Cognitive Status: Impaired/Different from baseline Area of Impairment: Attention;Safety/judgement;Awareness;Problem solving;Memory;Following commands                   Current Attention Level: Selective Memory: Decreased short-term memory Following Commands: Follows one step commands with increased time;Follows multi-step commands inconsistently Safety/Judgement: Decreased awareness of safety;Decreased awareness of deficits Awareness: Emergent Problem Solving: Slow processing;Requires verbal cues General Comments: Pt following simple commands.  Distracted by abdominal pain.  Required cues to wait for line/lead management      Exercises      General Comments General comments (skin integrity, edema, etc.): Pt was limited due to abdominal pain .      Pertinent Vitals/Pain Pain Assessment: Faces Faces Pain Scale: Hurts even more Pain Location: abdomen, from G tube Pain Descriptors / Indicators: Discomfort Pain Intervention(s): Monitored during session;Limited activity within patient's tolerance;Repositioned    Home Living  Prior Function            PT Goals (current goals can now be found in the care plan  section) Acute Rehab PT Goals Patient Stated Goal: go home PT Goal Formulation: With patient Time For Goal Achievement: 09/02/20 Potential to Achieve Goals: Good Progress towards PT goals: Progressing toward goals    Frequency    Min 4X/week      PT Plan Current plan remains appropriate    Co-evaluation              AM-PAC PT "6 Clicks" Mobility   Outcome Measure  Help needed turning from your back to your side while in a flat bed without using bedrails?: None Help needed moving from lying on your back to sitting on the side of a flat bed without using bedrails?: A Little Help needed moving to and from a bed to a chair (including a wheelchair)?: None Help needed standing up from a chair using your arms (e.g., wheelchair or bedside chair)?: A Little Help needed to walk in hospital room?: A Little Help needed climbing 3-5 steps with a railing? : A Little 6 Click Score: 20    End of Session Equipment Utilized During Treatment: Gait belt Activity Tolerance: Patient limited by fatigue;Patient limited by pain Patient left: with call bell/phone within reach;in bed;with bed alarm set Nurse Communication: Mobility status PT Visit Diagnosis: Unsteadiness on feet (R26.81);Difficulty in walking, not elsewhere classified (R26.2)     Time: 1700-1715 PT Time Calculation (min) (ACUTE ONLY): 15 min  Charges:  $Gait Training: 8-22 mins                     Anise Salvo, PT Acute Rehab Services Pager [phone] [facility] [phone]     Rayetta Humphrey 08/20/2020, 5:31 PM

## 2020-08-20 NOTE — Progress Notes (Signed)
Pharmacy Antibiotic Note  Adam [name] is a 59 y.o. male admitted on 08/03/2020 with aspiration pneumonia.  Pharmacy has been consulted for Zosyn dosing.  Patient has been here since 08/03/20 with CT showing new opacity consistent with pneumonia. Noted low body weight. ClCr 62 ml/min  Plan: Zosyn 3.375 gm IV q8hr Monitor cultures, clinical status, renal fx, vanc levels Narrow abx as able and f/u duration    Height: 6' (182.9 cm) (pt stated) Weight: 54 kg (119 lb 0.8 oz) IBW/kg (Calculated) : 77.6  Temp (24hrs), Avg:98.9 F (37.2 C), Min:98.3 F (36.8 C), Max:99.4 F (37.4 C)  Recent Labs  Lab 08/14/20 0224 08/15/20 0142 08/19/20 0227  WBC 7.1 7.0 10.9*  CREATININE  --  0.82 0.98    Estimated Creatinine Clearance: 62 mL/min (by C-G formula based on SCr of 0.98 mg/dL).    No Known Allergies  Antimicrobials this admission: Zosyn 9/28 >>   Microbiology results:   None   Thank you for allowing pharmacy to be a part of this patient's care.  Jeanella Cara, PharmD, [facility] Clinical Pharmacist Please see AMION for all Pharmacists' Contact Phone Numbers 08/20/2020, 8:02 AM

## 2020-08-20 NOTE — Progress Notes (Signed)
PROGRESS NOTE  Adam [name]  DOB: 19-Dec-1960  PCP: Patient, [name] OZD:[id number]  DOA: 08/03/2020  LOS: 16 days   Chief Complaint  Patient presents with  . Cerebrovascular Accident    Brief narrative: Adam [name] is a 59 y.o. year old male with medical history significant for HTN, prior stroke (residual right-sided weakness and dysarthria) who presented on 08/03/2020 with acute onset drooling, left arm/torso sensory changes, headache, bifacial weakness and worsening speech and gait. He was found to have acute right MCA territory infarcts.CTA of head showed high-grade focal distal stenosis within the distal A2/proximal A2 left anterior cerebral artery with high-grade focal stenosis in the P2 left PCA high-grade stenosis within the P4 right PCA.  CTA neck showed no significant stenosis.  TTE showed preserved EF.  LDL 80, A1c 5.7  Hospital course was complicated by persistent drooling and decreased p.o. intake requiring NG tube placement for tube feeds.   Now awaiting Plavix washout in order to have IR placed PEG tube on Friday 9/24  Subjective: Patient was seen and examined this morning. Sitting up in chair, not in distress.  Getting daily care. Remains tachycardic.   Assessment/Plan: Abdominal pain -Abdominal pain started after PEG tube placement.  Underwent CT abdomen 9/27 which ruled out any acute abdominal issues. -We will continue as needed tramadol.  Stop morphine IV.   -Need to relieve underlying constipation. -Schedule Senokot.  As needed MiraLAX  Right lower lobe pneumonia Aspiration pneumonia versus healthcare associated pneumonia -Noted in CT scan abdomen obtained on 9/27. -Likely cause of leukocytosis and tachycardia.  Normal lactic acid level.  Slightly elevated procalcitonin. -Start on IV Zosyn this morning.  Continue to monitor clinically. Recent Labs  Lab 08/14/20 0224 08/15/20 0142 08/19/20 0227 08/20/20 0800  WBC 7.1 7.0 10.9* 13.4*  LATICACIDVEN   --   --   --  1.3  PROCALCITON  --   --   --  0.21   Sinus tachycardia -Heart rate fast disproportionate to the degree of infection -Increase Coreg dose to 12.5 mg twice daily from tonight. -Continue normal saline at 75 mill per hour.  Continue to monitor blood pressure.  Acute right MCA territory infarcts, embolic event unclear source currently.   -Continues to have some left-sided weakness but able to mobilize well and does not currently need CIR. At discharge home we will set up for home health PT -Per neurology, continue aspirin, Plavix x3 weeks followed by aspirin alone, starting Aug 25, 2020. -continue high intensity level statin.   Dysphagia -Secondary to stroke -PEG tube placed on 9/24.  Feeding started.  Plavix has been resumed. -Continue intensive speech therapy while inpatient  B12 deficiency -in the setting of severe calorie protein malnutrition B12  -continue B12, thiamine, multivitamin supplementation Recent Labs    08/03/20 2355 08/14/20 0224 08/19/20 0227 08/20/20 0800  MCV  --    < > 74.2* 73.5*  VITAMINB12 90*  --   --   --   FOLATE 18.1  --   --   --    < > = values in this interval not displayed.   AKI, resolved Baseline creatinine 0.9-1. Peak creatinine 1.3, now back to baseline.  Like related to diminished oral intake.  Seems to be tolerating low-dose lisinopril well -Monitor BMP -Avoid nephrotoxins Recent Labs    08/03/20 1427 08/03/20 1438 08/07/20 1101 08/08/20 0135 08/09/20 0101 08/10/20 0443 08/15/20 0142 08/19/20 0227 08/20/20 0800  BUN 29* 30* 39* 45* 52* 48* 32* 39* 24*  CREATININE 1.10 0.90 1.30* 1.27* 1.31* 1.06 0.82 0.98 0.84   Hypokalemia/hypophosphatemia -Phosphorus level low at 2.4 today.  Replacement ordered. Recent Labs  Lab 08/15/20 0142 08/19/20 0227 08/20/20 0800  K 4.0 4.6 4.3  MG 2.3 2.3  --   PHOS 3.5 2.4* 2.8   Severe malnutrition.   -Has fair muscle atrophy on examination -Given persistent drooling and  difficulty swallowing even a liquid diet now on tube feeds via cor track  -Appreciate speech and nutrition consultations  Mobility: Encourage ambulation Code Status:   Code Status: Full Code  Nutritional status: Body mass index is 16.15 kg/m. Nutrition Problem: Severe Malnutrition Etiology: social / environmental circumstances Signs/Symptoms: severe muscle depletion, severe fat depletion, energy intake < or equal to 50% for > or equal to 1 month Diet Order            Diet NPO time specified  Diet effective now                 DVT prophylaxis: enoxaparin (LOVENOX) injection 40 mg Start: 08/04/20 1000   Antimicrobials:  IV Zosyn Fluid: None Consultants: IR, neurology Family Communication:  None at bedside  Status is: Inpatient  Remains inpatient appropriate because -care management making arrangements for outpatient tube feeding, home health and PT services.   Dispo:  Patient From:    Planned Disposition: Home  Expected discharge date: Likely tomorrow  medically stable for discharge:    Infusions:  . sodium chloride 1 mL (08/20/20 0214)  . feeding supplement (OSMOLITE 1.5 CAL) 60 mL/hr at 08/18/20 1100  . piperacillin-tazobactam (ZOSYN)  IV      Scheduled Meds: . aspirin  81 mg Per Tube Daily   Or  . aspirin  300 mg Rectal Daily  . atorvastatin  80 mg Per Tube Daily  . carvedilol  6.25 mg Per Tube BID WC  . clopidogrel  75 mg Per Tube Daily  . enoxaparin (LOVENOX) injection  40 mg Subcutaneous Daily  . feeding supplement (PROSource TF)  45 mL Per Tube TID  . multivitamin  15 mL Per Tube Daily  . thiamine  100 mg Per Tube Daily  . vitamin B-12  1,000 mcg Per Tube Daily    Antimicrobials: Anti-infectives (From admission, onward)   Start     Dose/Rate Route Frequency Ordered Stop   08/21/20 0300  vancomycin (VANCOREADY) IVPB 750 mg/150 mL  Status:  Discontinued        750 mg 150 mL/hr over 60 Minutes Intravenous Every 24 hours 08/20/20 0140 08/20/20 0741    08/20/20 0830  piperacillin-tazobactam (ZOSYN) IVPB 3.375 g        3.375 g 12.5 mL/hr over 240 Minutes Intravenous Every 8 hours 08/20/20 0800     08/20/20 0145  ceFEPIme (MAXIPIME) 2 g in sodium chloride 0.9 % 100 mL IVPB  Status:  Discontinued        2 g 200 mL/hr over 30 Minutes Intravenous Every 12 hours 08/20/20 0140 08/20/20 0741   08/20/20 0145  vancomycin (VANCOCIN) IVPB 1000 mg/200 mL premix        1,000 mg 200 mL/hr over 60 Minutes Intravenous  Once 08/20/20 0140 08/20/20 0315   08/16/20 1015  ceFAZolin (ANCEF) IVPB 2g/100 mL premix        2 g 200 mL/hr over 30 Minutes Intravenous  Once 08/16/20 1002 08/16/20 1052     PRN meds: hydrALAZINE, morphine injection, Resource ThickenUp Clear, traMADol   Objective: Vitals:   08/19/20 2327 08/20/20 1194  BP: 117/87 (!) 148/98  Pulse: (!) 108 (!) 112  Resp: 20 19  Temp: 98.3 F (36.8 C)   SpO2: 99% 95%    Intake/Output Summary (Last 24 hours) at 08/20/2020 1121 Last data filed at 08/20/2020 1000 Gross per 24 hour  Intake --  Output 525 ml  Net -525 ml   Filed Weights   08/15/20 0500 08/16/20 0500 08/17/20 0500  Weight: 58.1 kg 58.1 kg 54 kg   Weight change:  Body mass index is 16.15 kg/m.   Physical Exam: General exam: Appears calm and comfortable. Not in physical distress. Thin built Skin: No rashes, lesions or ulcers. HEENT: Atraumatic, normocephalic, supple neck, no obvious bleeding.   Lungs: Clear to auscultation bilaterally CVS: Remains tachycardic, regular rhythm, no murmur GI/Abd soft, tenderness present in left lower quadrant, nondistended, bowel sound present.  PEG tube site intact.   CNS: Alert, awake Psychiatry: Mood appropriate Extremities: No calf tenderness, no pedal edema  Data Review: I have personally reviewed the laboratory data and studies available.  Recent Labs  Lab 08/14/20 0224 08/15/20 0142 08/19/20 0227 08/20/20 0800  WBC 7.1 7.0 10.9* 13.4*  NEUTROABS  --  3.9 7.8* 10.6*  HGB  14.2 13.5 14.3 12.3*  HCT 43.1 40.2 43.2 36.0*  MCV 74.6* 74.3* 74.2* 73.5*  PLT 182 171 225 201   Recent Labs  Lab 08/15/20 0142 08/19/20 0227 08/20/20 0800  NA 136 138 134*  K 4.0 4.6 4.3  CL 99 101 100  CO2 28 26 23   GLUCOSE 119* 129* 123*  BUN 32* 39* 24*  CREATININE 0.82 0.98 0.84  CALCIUM 9.6 10.0 9.2  MG 2.3 2.3  --   PHOS 3.5 2.4* 2.8   F/u labs ordered  Signed, , MD [facility] 08/20/2020

## 2020-08-20 NOTE — Progress Notes (Signed)
Pharmacy Antibiotic Note  Adam [name] is a 59 y.o. male admitted on 08/03/2020 with pneumonia.  Pharmacy has been consulted for cefepime and vancomycin dosing.  Patient has been here since 08/03/20 with CT showing new opacity consistent with pneumonia. Noted low body weight. ClCr at boarder with slight fluctuation.   Plan: Vancomycin 1g x1 then 750mg  Q24hr Cefepime 2g Q 12hr Monitor cultures, clinical status, renal fx, vanc levels Narrow abx as able and f/u duration    Height: 6' (182.9 cm) (pt stated) Weight: 54 kg (119 lb 0.8 oz) IBW/kg (Calculated) : 77.6  Temp (24hrs), Avg:98.9 F (37.2 C), Min:98.3 F (36.8 C), Max:99.4 F (37.4 C)  Recent Labs  Lab 08/14/20 0224 08/15/20 0142 08/19/20 0227  WBC 7.1 7.0 10.9*  CREATININE  --  0.82 0.98    Estimated Creatinine Clearance: 62 mL/min (by C-G formula based on SCr of 0.98 mg/dL).    No Known Allergies  Antimicrobials this admission: Cefe 9/28 >>  Vanc  9/28 >>    Microbiology results:   None   Thank you for allowing pharmacy to be a part of this patient's care.  10/28, PharmD, BCPS, BCCP Clinical Pharmacist  Please check AMION for all [facility] Pharmacy phone numbers After 10:00 PM, call Main Pharmacy [phone]

## 2020-08-20 NOTE — Progress Notes (Signed)
SLP Cancellation Note  Patient Details Name: Renley Banwart MRN: [id number] DOB: 03/28/1961   Cancelled treatment:       Reason Eval/Treat Not Completed: Patient at procedure or test/unavailable. Wil continue efforts.  Blia Totman B. Murvin Natal, [facility], CCC-SLP Speech Language Pathologist Office: [phone]  Leigh Aurora 08/20/2020, 2:07 PM

## 2020-08-21 ENCOUNTER — Encounter: Payer: Self-pay | Admitting: Internal Medicine

## 2020-08-21 DIAGNOSIS — R1310 Dysphagia, unspecified: Secondary | ICD-10-CM

## 2020-08-21 LAB — BASIC METABOLIC PANEL
Anion gap: 12 (ref 5–15)
BUN: 22 mg/dL — ABNORMAL HIGH (ref 6–20)
CO2: 20 mmol/L — ABNORMAL LOW (ref 22–32)
Calcium: 9.3 mg/dL (ref 8.9–10.3)
Chloride: 103 mmol/L (ref 98–111)
Creatinine, Ser: 0.85 mg/dL (ref 0.61–1.24)
GFR calc Af Amer: >60 mL/min (ref >60)
GFR calc non Af Amer: >60 mL/min (ref >60)
Glucose, Bld: 127 mg/dL — ABNORMAL HIGH (ref 70–99)
Potassium: 4.3 mmol/L (ref 3.5–5.1)
Sodium: 135 mmol/L (ref 135–145)

## 2020-08-21 LAB — GLUCOSE, CAPILLARY
Glucose-Capillary: 134 mg/dL — ABNORMAL HIGH (ref 70–99)
Glucose-Capillary: 142 mg/dL — ABNORMAL HIGH (ref 70–99)
Glucose-Capillary: 142 mg/dL — ABNORMAL HIGH (ref 70–99)
Glucose-Capillary: 161 mg/dL — ABNORMAL HIGH (ref 70–99)

## 2020-08-21 LAB — PROCALCITONIN: Procalcitonin: 0.2 ng/mL

## 2020-08-21 MED ORDER — CLOPIDOGREL BISULFATE 75 MG PO TABS: 75.0000 mg | ORAL_TABLET | Freq: Every day | ORAL | 0 refills | Status: AC

## 2020-08-21 MED ORDER — OSMOLITE 1.5 CAL PO LIQD: 1000.0000 mL | ORAL | 10 refills | Status: DC

## 2020-08-21 MED ORDER — AMOXICILLIN-POT CLAVULANATE 875-125 MG PO TABS: 1.0000 | ORAL_TABLET | Freq: Two times a day (BID) | ORAL | 0 refills | Status: AC

## 2020-08-21 MED ORDER — THIAMINE HCL 100 MG PO TABS: 100.0000 mg | ORAL_TABLET | Freq: Every day | ORAL | 0 refills | Status: AC

## 2020-08-21 MED ORDER — CYANOCOBALAMIN 1000 MCG PO TABS: 1000.0000 ug | ORAL_TABLET | Freq: Every day | ORAL | 0 refills | Status: AC

## 2020-08-21 MED ORDER — TRAMADOL HCL 50 MG PO TABS
50.0000 mg | ORAL_TABLET | Freq: Four times a day (QID) | ORAL | 0 refills | Status: AC | PRN
Start: 2020-08-21 — End: 2020-08-26

## 2020-08-21 MED ORDER — ATORVASTATIN CALCIUM 80 MG PO TABS: 80.0000 mg | ORAL_TABLET | Freq: Every day | ORAL | 0 refills | Status: DC

## 2020-08-21 MED ORDER — CARVEDILOL 12.5 MG PO TABS: 12.5000 mg | ORAL_TABLET | Freq: Two times a day (BID) | ORAL | 0 refills | Status: DC

## 2020-08-21 MED ORDER — ASPIRIN 81 MG PO CHEW: 81.0000 mg | CHEWABLE_TABLET | Freq: Every day | ORAL | 0 refills | Status: AC

## 2020-08-21 MED ORDER — ADULT MULTIVITAMIN LIQUID CH: 15.0000 mL | Freq: Every day | ORAL | 0 refills | Status: AC

## 2020-08-21 MED ORDER — SENNOSIDES-DOCUSATE SODIUM 8.6-50 MG PO TABS: 1.0000 | ORAL_TABLET | Freq: Every day | ORAL | 0 refills | Status: AC

## 2020-08-21 NOTE — Progress Notes (Addendum)
   08/21/20 1040  Clinical Encounter Type  Visited With Patient  Visit Type Follow-up  Referral From Chaplain  Consult/Referral To Chaplain  Visited patient while rounding. Usually, patient do not talk. I always ask how he is feeling and use hand gestures of thumbs up or down. Patient always responds with a thumbs up. Today, the patient whispered, "pray for me." I was elated he used his voice.  I prayed, read Psalm 23 and Psalm 100. I commented and complimented that I saw him walking with PT and he did a great job. Patient appeared to give me a slight smile. This note was prepared by Deneen Harts, M.Div..  For questions please contact by phone [phone].

## 2020-08-21 NOTE — TOC Transition Note (Addendum)
Transition of Care [facility]) - CM/SW Discharge Note   Patient Details  Name: Adam [name] MRN: [id number] Date of Birth: 11/04/1961  Transition of Care [facility]) CM/SW Contact:  Joanne Chars, LCSW Phone Number: 08/21/2020, 2:38 PM   Clinical Narrative:   Pt discharging today.  [employer]/Pam Tamera Punt met with pt sister, Lyndee Leo, to complete training for pt tube feeds.  Per Pam, sister did well and the fact that sister is CNA is very helpful. Per Pam pump and supplies will be delivered today. Efforts were made to locate [facility] RN services by calling [facility], [facility], [facility] but all declined.  Pt has medicaid as payor.  Pam aware that [facility] will not be in place and will support sister/pt.  CSW contacted [place] [facility] to initiate medicaid transportation and the sister was able to complete the initial assessment and can now schedule transporation to the outpt PT/OT/speech services at [facility].  [facility] to provide hospital bed and shower chair.  Pt did not have PCP and appt has been scheduled at [facility].     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

## 2020-08-21 NOTE — Progress Notes (Signed)
°  Speech Language Pathology Treatment: Dysphagia;Cognitive-Linquistic  Patient Details Name: Adam [name] MRN: [id number] DOB: Sep 24, 1961 Today's Date: 08/21/2020 Time: 5277-8242 SLP Time Calculation (min) (ACUTE ONLY): 30 min  Assessment / Plan / Recommendation Clinical Impression  Pt was seen at bedside for skilled ST intervention targeting goals for speech and swallowing. Pt was awake and alert, and initially agreeable to treatment today. Pt verbalized the need to go to the bathroom. After toileting, pt completed oral care with suction to manage anterior leakage, after which pt accepted ice chips x2. He requires encouragement to maintain lip closure and orally manipulate the ice chip. He allowed the ice to fall from his mouth, and no swallow reflex was elicited. Pt indicated he did not want to continue to work due to poor sleep last night. SLP encouraged pt to try and swallow secretions to maximize pharyngeal strength and minimize disuse atrophy. SLP will continue per plan of care.   HPI HPI: Patient is a 59 y.o. 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 Per chart review, his sister had reported she noticed bifacial weakness, significant worsened speech and significantly worsened gait. MRI brain revealed patchy small volume acute right MCA territory infarcts involving right frontoparietal region but no associated hemorrhage or mass effect, as well as underlying advanced cerebral atrophy and chronic microvascular ischemic disease, multiple remote lacunar infarcts about the basal ganglia and right cerebellum.      SLP Plan  Continue with current plan of care       Recommendations  Diet recommendations: Other(comment) (ice chips) Liquids provided via: Teaspoon Medication Administration: Via alternative means                Oral Care  Recommendations: Oral care QID;Oral care prior to ice chip/H20 Follow up Recommendations: Home health SLP SLP Visit Diagnosis: Dysarthria and anarthria (R47.1);Dysphagia, unspecified (R13.10) Plan: Continue with current plan of care       GO              Finola Rosal B. Murvin Natal, [facility], CCC-SLP Speech Language Pathologist Office: [phone]  Leigh Aurora 08/21/2020, 10:24 AM

## 2020-08-21 NOTE — TOC Progression Note (Signed)
Transition of Care [facility]) - Progression Note    Patient Details  Name: Adam [name] MRN: [id number] Date of Birth: July 08, 1961  [facility]) CM/SW Contact  Beckie Busing, RN Phone Number: [phone]  08/21/2020, 3:06 PM  Clinical Narrative:       Durable Medical Equipment  (From admission, onward)         Start     Ordered   08/21/20 1504  For home use only DME Hospital bed  Once       Question Answer Comment  Length of Need Lifetime   Patient has (list medical condition): Acute CVA   The above medical condition requires: Patient requires the ability to reposition frequently   Head must be elevated greater than: 30 degrees   Bed type Semi-electric   Support Surface: Gel Overlay      08/21/20 1506   08/18/20 1125  For home use only DME Tube feeding  Once       Comments: ContinueOsmolite1.5cal @60ml /hr via G-tube.  Provide 48ml Prosource TFTID per tube.   At goal, tube feeding provides2280kcals,123grams protein, 48m free water   08/18/20 1124           Expected Discharge Plan: Home/Self Care Barriers to Discharge: Barriers Resolved  Expected Discharge Plan and Services Expected Discharge Plan: Home/Self Care     Post Acute Care Choice: NA Living arrangements for the past 2 months: Apartment Expected Discharge Date: 08/21/20               DME Arranged: Hospital bed, Shower stool DME Agency: [employer] Date DME Agency Contacted: 08/21/20 Time DME Agency Contacted: 1400 Representative spoke with at DME Agency: [facility]             Social Determinants of Health (SDOH) Interventions    Readmission Risk Interventions Readmission Risk Prevention Plan 08/21/2020  Transportation Screening Complete  PCP or Specialist Appt within 5-7 Days Not Complete  Not Complete comments First available appt was 10/11.  Home Care Screening Complete  Medication Review (RN CM) Complete

## 2020-08-21 NOTE — Progress Notes (Signed)
Pt. Complained of muscle spasm pain in the upper right thigh. Pt. Appears uncomfortable. MD notified. MD said to give tramadol for pain X's 1 now. Med given.

## 2020-08-21 NOTE — Progress Notes (Signed)
AVS documentation reviewed with pt. and sister. Educated pt and sister about tube care. PT belongings sent with pt. Pt left in stable condition with sister via car.

## 2020-08-21 NOTE — Discharge Summary (Signed)
Physician Discharge Summary  [facility] PQZ:[id number] DOB: [birth year] DOA: 08/03/2020  PCP: Patient, [name]  Admit date: 08/03/2020 Discharge date: 08/21/2020  Admitted From: Home Discharge disposition: Home   Code Status: Full Code  Diet Recommendation: Tube feeding  Discharge Diagnosis:   Principal Problem:   Acute CVA (cerebrovascular accident) (HCC) Active Problems:   Dysphagia   HTN (hypertension)   B12 deficiency   Protein-calorie malnutrition, severe (HCC)   AKI (acute kidney injury) (HCC)   Hypokalemia   Drooling   History of Present Illness / Brief narrative:  Adam Monroeis a 59 y.o.year old malewith medical history significant for HTN, prior stroke (residual right-sided weakness and dysarthria) who presented on 9/11/2021with acute onset drooling, left arm/torso sensory changes, headache, bifacial weakness and worsening speech and gait. He was found to have acute right MCA territory infarcts.CTA of head showed high-grade focal distal stenosis within the distal A2/proximal A2 left anterior cerebral artery with high-grade focal stenosis in the P2 left PCA high-grade stenosis within the P4 right PCA. CTA neck showed no significant stenosis. TTE showed preserved EF. LDL 80, A1c 5.7  Hospital course was complicated by persistent drooling and decreased p.o. intakerequiring NG tube placement for tube feeds.  He finally underwent PEG tube placement on 9/24.   Subjective:  Seen and examined this morning.  Thin built middle-aged African-American male. Abdomen pain improving.  Tube feeding resumed. Tachycardia improving.  Hospital Course:  Acute right MCA territory infarcts, embolic event unclear source currently.  -Continues to have some left-sided weakness but able to mobilize well and does not currently need CIR. At discharge, home health PT arranged. -Per neurology, continue aspirin, Plavix x3 weeks followed by aspirin alone, starting Aug 25, 2020.   -Prescription given accordingly. -continue high intensity level statin.   Dysphagia -Secondary to stroke -PEG tube placed on 9/24.  Feeding started.   -Follow-up with speech therapy as an outpatient.  Abdominal pain -Abdominal pain started after PEG tube placement.  Underwent CT abdomen 9/27 which ruled out any acute abdominal issues. -Improved with bowel movement and as needed tramadol. -Continue scheduled Senokot and PRN MiraLAX  Right lower lobe pneumonia Aspiration pneumonia versus healthcare associated pneumonia -Noted in CT scan abdomen obtained on 9/27. -Likely cause of leukocytosis and tachycardia.  Normal lactic acid level.  Slightly elevated procalcitonin. -He was started on IV Zosyn.  Continue Augmentin at home for 7 more days.  Sinus tachycardia -Likely related to pneumonia. Improving on Coreg 12.5 mg twice daily.  B12 deficiency -in the setting of severe calorie protein malnutrition B12  -continue B12, thiamine, multivitamin supplementation Recent Labs    08/03/20 2355 08/14/20 0224 08/19/20 0227 08/20/20 0800  MCV  --    < > 74.2* 73.5*  VITAMINB12 90*  --   --   --   FOLATE 18.1  --   --   --    < > = values in this interval not displayed.   AKI, resolved -Baseline creatinine 0.9-1. Peak creatinine 1.3, now back to baseline. Like related to diminished oral intake.  Recent Labs    08/03/20 1427 08/03/20 1438 08/07/20 1101 08/08/20 0135 08/09/20 0101 08/10/20 0443 08/15/20 0142 08/19/20 0227 08/20/20 0800 08/21/20 0102  BUN 29* 30* 39* 45* 52* 48* 32* 39* 24* 22*  CREATININE 1.10 0.90 1.30* 1.27* 1.31* 1.06 0.82 0.98 0.84 0.85   Hypokalemia/hypophosphatemia -Levels improved with replacement. Recent Labs  Lab 08/15/20 0142 08/19/20 0227 08/20/20 0800 08/21/20 0102  K 4.0 4.6 4.3  4.3  MG 2.3 2.3  --   --   PHOS 3.5 2.4* 2.8  --    Severe malnutrition. -Has fair muscle atrophy on examination -Given persistent drooling and difficulty  swallowing even a liquid diet now on tube feeds via cor track  -Appreciate speech and nutrition consultations  Stable for discharge to home today.  Wound care:    Discharge Exam:   Vitals:   08/20/20 0834 08/20/20 1717 08/21/20 0024 08/21/20 0806  BP: (!) 148/98 134/88 (!) 146/97 123/73  Pulse: (!) 112 (!) 105 (!) 111 (!) 105  Resp: Temp:  98.5 F (36.9 C) 98.4 F (36.9 C) 98.3 F (36.8 C)  TempSrc:   Oral   SpO2: 95% 99% 98% 100%  Weight:      Height:        Body mass index is 16.15 kg/m.  General exam: Appears calm and comfortable.  Not in physical distress.  Thin built Skin: No rashes, lesions or ulcers. HEENT: Atraumatic, normocephalic, supple neck, no obvious bleeding Lungs: Clear to auscultation bilaterally CVS: Regular rate and rhythm, no murmur GI/Abd soft, nontender, nondistended, bowel sound present CNS: Alert, awake, oriented.  Speech deficit because of stroke. Psychiatry: Mood appropriate Extremities: No pedal edema, no calf tenderness  Follow ups:   Discharge Instructions    Ambulatory referral to Neurology   Complete by: As directed    Follow up with stroke clinic NP (Jessica Vanschaick or Darrol Angel, if both not available, consider Manson Allan, or Ahern) at [facility] in about 4 weeks. Thanks.   Ambulatory referral to Occupational Therapy   Complete by: As directed    Ambulatory referral to Physical Therapy   Complete by: As directed    Ambulatory referral to Speech Therapy   Complete by: As directed    Increase activity slowly   Complete by: As directed       Follow-up Information    [facility]. Go on 09/02/2020.   Why: Please attend your hospital follow up appt with Delfin Gant on Mon, 09/02/20, at 3pm.  Please attend your new patient appt on Wed, 10/23/20, at 1:50pm with Loreen Freud. Contact information: [address] [place] [address] [phone]       [facility]   Specialty: Rehabilitation Why: The outpatient rehab will contact you for the first appointment Contact information: [address] [id number] mc [place] [address] [phone]       [facility]. Schedule an appointment as soon as possible for a visit in 4 week(s).   Specialty: Neurology Contact information: [address] [place] [address] [phone]       [facility] .               Recommendations for Outpatient Follow-Up:   1. Follow-up with PCP as an outpatient  Discharge Instructions:  Follow with Primary MD Patient, [name] in 7 days   Get CBC/BMP checked in next visit within 1 week by PCP or SNF MD ( we routinely change or add medications that can affect your baseline labs and fluid status, therefore we recommend that you get the mentioned basic workup next visit with your PCP, your PCP may decide not to get them or add new tests based on their clinical decision)  On your next visit with your PCP, please Get Medicines reviewed and adjusted.  Please request your PCP  to go over all Hospital Tests and Procedure/Radiological results at the follow up, please get all Hospital records sent to your Prim MD by signing hospital release before you go home.  Activity: As tolerated with Full fall precautions use walker/cane & assistance as needed  For Heart failure patients - Check your Weight same time everyday, if you gain over 2 pounds, or you develop in leg swelling, experience more shortness of breath or chest pain, call your Primary MD immediately. Follow Cardiac Low Salt Diet and 1.5 lit/day fluid restriction.  If you have smoked or chewed Tobacco in the last 2 yrs please stop smoking, stop any regular Alcohol  and or any Recreational drug use.  If you experience worsening of your admission symptoms, develop shortness of breath, life  threatening emergency, suicidal or homicidal thoughts you must seek medical attention immediately by calling 911 or calling your MD immediately  if symptoms less severe.  You Must read complete instructions/literature along with all the possible adverse reactions/side effects for all the Medicines you take and that have been prescribed to you. Take any new Medicines after you have completely understood and accpet all the possible adverse reactions/side effects.   Do not drive, operate heavy machinery, perform activities at heights, swimming or participation in water activities or provide baby sitting services if your were admitted for syncope or siezures until you have seen by Primary MD or a Neurologist and advised to do so again.  Do not drive when taking Pain medications.  Do not take more than prescribed Pain, Sleep and Anxiety Medications  Wear Seat belts while driving.   Please note You were cared for by a hospitalist during your hospital stay. If you have any questions about your discharge medications or the care you received while you were in the hospital after you are discharged, you can call the unit and asked to speak with the hospitalist on call if the hospitalist that took care of you is not available. Once you are discharged, your primary care physician will handle any further medical issues. Please note that NO REFILLS for any discharge medications will be authorized once you are discharged, as it is imperative that you return to your primary care physician (or establish a relationship with a primary care physician if you do not have one) for your aftercare needs so that they can reassess your need for medications and monitor your lab values.    Allergies as of 08/21/2020   No Known Allergies     Medication List    TAKE these medications   amoxicillin-clavulanate 875-125 MG tablet Commonly known as: Augmentin Place 1 tablet into feeding tube every 12 (twelve) hours for 7  days.   aspirin 81 MG chewable tablet Place 1 tablet (81 mg total) into feeding tube daily. Start taking on: August 22, 2020   atorvastatin 80 MG tablet Commonly known as: LIPITOR Place 1 tablet (80 mg total) into feeding tube daily. Start taking on: August 22, 2020   carvedilol 12.5 MG tablet Commonly known as: COREG Place 1 tablet (12.5 mg total) into feeding tube 2 (two) times daily with a meal.   clopidogrel 75 MG tablet Commonly known as: PLAVIX Place 1 tablet (75 mg total) into feeding tube daily for 3 days. Start taking on: August 22, 2020   cyanocobalamin 1000 MCG tablet Place 1 tablet (1,000 mcg total) into feeding tube daily. Start taking on: August 22, 2020   feeding supplement (OSMOLITE 1.5 CAL) Liqd Place 1,000 mLs  into feeding tube continuous.   multivitamin Liqd Place 15 mLs into feeding tube daily. Start taking on: August 22, 2020   senna-docusate 8.6-50 MG tablet Commonly known as: Senokot-S Take 1 tablet by mouth at bedtime.   thiamine 100 MG tablet Place 1 tablet (100 mg total) into feeding tube daily. Start taking on: August 22, 2020   traMADol 50 MG tablet Commonly known as: ULTRAM 1 tablet (50 mg total) by Per NG tube route every 6 (six) hours as needed for up to 5 days for moderate pain.            Durable Medical Equipment  (From admission, onward)         Start     Ordered   08/18/20 1125  For home use only DME Tube feeding  Once       Comments: ContinueOsmolite1.5cal /hr via G-tube.  Provide 45ml Prosource TFTID per tube.   At goal, tube feeding provides2280kcals,123grams protein, free water   08/18/20 1124          Time coordinating discharge: 35 minutes  The results of significant diagnostics from this hospitalization (including imaging, microbiology, ancillary and laboratory) are listed below for reference.    Procedures and Diagnostic Studies:   CT Angio Head W or Wo  Contrast  Result Date: 08/03/2020 CLINICAL DATA:  Neuro deficit, acute, stroke suspected. Additional history provided: Numbness yesterday. Drooling. EXAM: CT ANGIOGRAPHY HEAD AND NECK TECHNIQUE: Multidetector CT imaging of the head and neck was performed using the standard protocol during bolus administration of intravenous contrast. Multiplanar CT image reconstructions and MIPs were obtained to evaluate the vascular anatomy. Carotid stenosis measurements (when applicable) are obtained utilizing NASCET criteria, using the distal internal carotid diameter as the denominator. CONTRAST:  75mL OMNIPAQUE IOHEXOL 350 MG/ML SOLN COMPARISON:  No pertinent prior exams are available for comparison. FINDINGS: CT HEAD FINDINGS Brain: Mild generalized parenchymal atrophy. There are multiple chronic appearing lacunar infarcts within the basal ganglia bilaterally. Background advanced ill-defined hypoattenuation within the cerebral white matter is nonspecific, but consistent with chronic small vessel ischemic disease. Subcentimeter age-indeterminate infarct within right cerebellar hemisphere (series 5, image 13). There is no acute intracranial hemorrhage. No demarcated cortical infarct is identified. No extra-axial fluid collection. No evidence of intracranial mass. No midline shift. Vascular: Reported below. Skull: Normal. Negative for fracture or focal lesion. Sinuses: No significant paranasal sinus disease or mastoid effusion at the imaged levels. Orbits: No acute finding. Chronic fracture deformity of the right lamina papyracea. Review of the MIP images confirms the above findings CTA NECK FINDINGS Aortic arch: Common origin of the innominate and left common carotid arteries. The visualized aortic arch is unremarkable. No hemodynamically significant innominate or proximal subclavian artery stenosis. Right carotid system: CCA and ICA patent within the neck without stenosis. Left carotid system: CCA and ICA patent within the  neck without stenosis. Vertebral arteries: Codominant and patent within the neck without stenosis Skeleton: No acute bony abnormality or aggressive osseous lesion. Cervical spondylosis with multilevel disc space narrowing, disc bulges, uncovertebral and facet hypertrophy. Other neck: No neck mass or cervical lymphadenopathy. Thyroid unremarkable. Upper chest: No consolidation within the imaged lung apices. Review of the MIP images confirms the above findings CTA HEAD FINDINGS Anterior circulation: The intracranial internal carotid arteries are patent. The M1 middle cerebral arteries are patent. There is moderate stenosis within the distal M1 right middle cerebral artery. No definite right M2 proximal branch occlusion is identified. However, there is a paucity of  visualized proximal superior division right M2 MCA branches. The right anterior cerebral artery is patent without significant proximal stenosis. There is a high-grade focal stenosis within the distal A2/proximal A3 left anterior cerebral artery. No intracranial aneurysm is identified. Posterior circulation: The intracranial vertebral arteries are patent. The basilar artery is patent. The posterior cerebral arteries are patent. There is a high-grade focal stenosis within the P2 left PCA. Additionally, there are multifocal high-grade stenoses within the P4 right PCA. Posterior communicating arteries are present bilaterally. Venous sinuses: Within limitations of contrast timing, no convincing thrombus. Anatomic variants: None significant. Review of the MIP images confirms the above findings These results were called by telephone at the time of interpretation on 08/03/2020 at 8:28 pm to provider [facility] , who verbally acknowledged these results. IMPRESSION: CT head: 1. Subcentimeter age-indeterminate infarct within the right cerebellum. 2. Multiple chronic appearing lacunar infarcts within the bilateral basal ganglia. 3. Background mild generalized  parenchymal atrophy with advanced chronic small vessel ischemic disease. CTA neck: The common carotid, internal carotid and vertebral arteries are patent within the neck without hemodynamically significant stenosis. CTA head: 1. No definite proximal right M2 branch occlusion is identified. However, there is a paucity of visualized proximal M2 superior division right MCA branch vessels. If there is concern for an acute right MCA territory stroke, consider catheter based angiography for further evaluation. 2. Intracranial atherosclerotic disease with multifocal stenoses, most notably as follows. 3. Moderate stenosis within the distal M1 right middle cerebral artery. 4. High-grade focal stenosis within the distal A2/proximal A3 left anterior cerebral artery. 5. High-grade focal stenosis within the P2 left PCA. 6. High-grade stenoses within the P4 right PCA. Electronically Signed   By: Jackey Loge DO   On: 08/03/2020 20:29   CT ANGIO NECK W OR WO CONTRAST  Result Date: 08/03/2020 CLINICAL DATA:  Neuro deficit, acute, stroke suspected. Additional history provided: Numbness yesterday. Drooling. EXAM: CT ANGIOGRAPHY HEAD AND NECK TECHNIQUE: Multidetector CT imaging of the head and neck was performed using the standard protocol during bolus administration of intravenous contrast. Multiplanar CT image reconstructions and MIPs were obtained to evaluate the vascular anatomy. Carotid stenosis measurements (when applicable) are obtained utilizing NASCET criteria, using the distal internal carotid diameter as the denominator. CONTRAST:  75mL OMNIPAQUE IOHEXOL 350 MG/ML SOLN COMPARISON:  No pertinent prior exams are available for comparison. FINDINGS: CT HEAD FINDINGS Brain: Mild generalized parenchymal atrophy. There are multiple chronic appearing lacunar infarcts within the basal ganglia bilaterally. Background advanced ill-defined hypoattenuation within the cerebral white matter is nonspecific, but consistent with chronic  small vessel ischemic disease. Subcentimeter age-indeterminate infarct within right cerebellar hemisphere (series 5, image 13). There is no acute intracranial hemorrhage. No demarcated cortical infarct is identified. No extra-axial fluid collection. No evidence of intracranial mass. No midline shift. Vascular: Reported below. Skull: Normal. Negative for fracture or focal lesion. Sinuses: No significant paranasal sinus disease or mastoid effusion at the imaged levels. Orbits: No acute finding. Chronic fracture deformity of the right lamina papyracea. Review of the MIP images confirms the above findings CTA NECK FINDINGS Aortic arch: Common origin of the innominate and left common carotid arteries. The visualized aortic arch is unremarkable. No hemodynamically significant innominate or proximal subclavian artery stenosis. Right carotid system: CCA and ICA patent within the neck without stenosis. Left carotid system: CCA and ICA patent within the neck without stenosis. Vertebral arteries: Codominant and patent within the neck without stenosis Skeleton: No acute bony abnormality or aggressive osseous lesion. Cervical  spondylosis with multilevel disc space narrowing, disc bulges, uncovertebral and facet hypertrophy. Other neck: No neck mass or cervical lymphadenopathy. Thyroid unremarkable. Upper chest: No consolidation within the imaged lung apices. Review of the MIP images confirms the above findings CTA HEAD FINDINGS Anterior circulation: The intracranial internal carotid arteries are patent. The M1 middle cerebral arteries are patent. There is moderate stenosis within the distal M1 right middle cerebral artery. No definite right M2 proximal branch occlusion is identified. However, there is a paucity of visualized proximal superior division right M2 MCA branches. The right anterior cerebral artery is patent without significant proximal stenosis. There is a high-grade focal stenosis within the distal A2/proximal A3  left anterior cerebral artery. No intracranial aneurysm is identified. Posterior circulation: The intracranial vertebral arteries are patent. The basilar artery is patent. The posterior cerebral arteries are patent. There is a high-grade focal stenosis within the P2 left PCA. Additionally, there are multifocal high-grade stenoses within the P4 right PCA. Posterior communicating arteries are present bilaterally. Venous sinuses: Within limitations of contrast timing, no convincing thrombus. Anatomic variants: None significant. Review of the MIP images confirms the above findings These results were called by telephone at the time of interpretation on 08/03/2020 at 8:28 pm to provider [facility] , who verbally acknowledged these results. IMPRESSION: CT head: 1. Subcentimeter age-indeterminate infarct within the right cerebellum. 2. Multiple chronic appearing lacunar infarcts within the bilateral basal ganglia. 3. Background mild generalized parenchymal atrophy with advanced chronic small vessel ischemic disease. CTA neck: The common carotid, internal carotid and vertebral arteries are patent within the neck without hemodynamically significant stenosis. CTA head: 1. No definite proximal right M2 branch occlusion is identified. However, there is a paucity of visualized proximal M2 superior division right MCA branch vessels. If there is concern for an acute right MCA territory stroke, consider catheter based angiography for further evaluation. 2. Intracranial atherosclerotic disease with multifocal stenoses, most notably as follows. 3. Moderate stenosis within the distal M1 right middle cerebral artery. 4. High-grade focal stenosis within the distal A2/proximal A3 left anterior cerebral artery. 5. High-grade focal stenosis within the P2 left PCA. 6. High-grade stenoses within the P4 right PCA. Electronically Signed   By: Jackey Loge DO   On: 08/03/2020 20:29   MR Brain W and Wo Contrast  Result Date:  08/04/2020 CLINICAL DATA:  Initial evaluation for neuro deficit, stroke suspected. EXAM: MRI HEAD WITHOUT AND WITH CONTRAST TECHNIQUE: Multiplanar, multiecho pulse sequences of the brain and surrounding structures were obtained without and with intravenous contrast. CONTRAST:  6mL GADAVIST GADOBUTROL 1 MMOL/ML IV SOLN COMPARISON:  Comparison made with prior CTA from 08/03/2020. FINDINGS: Brain: Advanced cerebral atrophy for age. Patchy and confluent T2/FLAIR hyperintensity within the periventricular deep white matter both cerebral hemispheres, most consistent with chronic microvascular ischemic disease, advanced for age. Multiple scattered remote lacunar infarcts present about the bilateral basal ganglia. Associated chronic hemosiderin staining noted about several of these infarcts. Additionally, there are multiple small remote right cerebellar infarcts. Patchy small volume restricted diffusion seen involving the cortical and subcortical right frontal parietal region, consistent with acute right MCA territory infarcts. Involvement of the pre and postcentral gyri noted. Largest area of infarction seen at the postcentral gyrus and measures up to 1.6 cm in diameter. No associated hemorrhage or mass effect. Gray-white matter differentiation otherwise maintained. No acute intracranial hemorrhage elsewhere within the brain. No mass lesion, midline shift or mass effect. Ventricles normal size without hydrocephalus. No extra-axial fluid collection. Pituitary gland and  suprasellar region within normal limits. No visible parenchymal signal abnormality seen about the medulla or brainstem with thin section imaging. No abnormal enhancement. Vascular: Major intracranial vascular flow voids are maintained. Skull and upper cervical spine: Craniocervical junction within normal limits. Bone marrow signal intensity within normal limits. No focal marrow replacing lesion. No scalp soft tissue abnormality. Sinuses/Orbits: Globes and  orbital soft tissues demonstrate no acute finding. Mild scattered mucosal thickening noted within the sphenoid ethmoidal sinuses. No mastoid effusion. Inner ear structures grossly normal. Other: None. IMPRESSION: 1. Patchy small volume acute right MCA territory infarcts involving the right frontoparietal region as above. No associated hemorrhage or mass effect. 2. Underlying advanced cerebral atrophy and chronic microvascular ischemic disease for age, with multiple remote lacunar infarcts about the bilateral basal ganglia and right cerebellum. Electronically Signed   By: Rise Mu M.D.   On: 08/04/2020 02:18   ECHOCARDIOGRAM COMPLETE BUBBLE STUDY  Result Date: 08/04/2020    ECHOCARDIOGRAM REPORT   Patient Name:   BOWYN MERCIER Date of Exam: 08/04/2020 Medical Rec #:  [id number]      Height: Accession #:    [id number]     Weight: Date of Birth:  05-01-1961      BSA: Patient Age:    58 years       BP:           171/70 mmHg Patient Gender: M              HR:           55 bpm. Exam Location:  Inpatient Procedure: 2D Echo Indications:    Stroke 434.91 / I63.9  History:        Patient has no prior history of Echocardiogram examinations.                 Stroke; Risk Factors:Hypertension and Current Smoker.  Sonographer:    Jeryl Columbia Referring Phys: [id number] CAROLE N HALL IMPRESSIONS  1. Left ventricular ejection fraction, by estimation, is 60 to 65%. The left ventricle has normal function. The left ventricle has no regional wall motion abnormalities. Left ventricular diastolic parameters were normal.  2. Right ventricular systolic function is normal. The right ventricular size is normal.  3. The mitral valve is normal in structure. Trivial mitral valve regurgitation. No evidence of mitral stenosis.  4. The aortic valve is normal in structure. Aortic valve regurgitation is not visualized. No aortic stenosis is present.  5. The inferior vena cava is normal in size with greater than 50% respiratory  variability, suggesting right atrial pressure of 3 mmHg.  6. Agitated saline contrast bubble study was negative, with no evidence of any interatrial shunt. FINDINGS  Left Ventricle: Left ventricular ejection fraction, by estimation, is 60 to 65%. The left ventricle has normal function. The left ventricle has no regional wall motion abnormalities. The left ventricular internal cavity size was normal in size. There is  no left ventricular hypertrophy. Left ventricular diastolic parameters were normal. Right Ventricle: The right ventricular size is normal. No increase in right ventricular wall thickness. Right ventricular systolic function is normal. Left Atrium: Left atrial size was normal in size. Right Atrium: Right atrial size was normal in size. Pericardium: There is no evidence of pericardial effusion. Mitral Valve: The mitral valve is normal in structure. There is mild thickening of the mitral valve leaflet(s). Trivial mitral valve regurgitation. No evidence of mitral valve stenosis. Tricuspid Valve: The tricuspid valve is normal in structure. Tricuspid valve regurgitation is not  demonstrated. No evidence of tricuspid stenosis. Aortic Valve: The aortic valve is normal in structure. Aortic valve regurgitation is not visualized. No aortic stenosis is present. Pulmonic Valve: The pulmonic valve was normal in structure. Pulmonic valve regurgitation is not visualized. No evidence of pulmonic stenosis. Aorta: The aortic root is normal in size and structure. Venous: The inferior vena cava is normal in size with greater than 50% respiratory variability, suggesting right atrial pressure of 3 mmHg. IAS/Shunts: No atrial level shunt detected by color flow Doppler. Agitated saline contrast was given intravenously to evaluate for intracardiac shunting. Agitated saline contrast bubble study was negative, with no evidence of any interatrial shunt. Charlton HawsPeter Nishan MD Electronically signed by Charlton HawsPeter Nishan MD Signature Date/Time:  08/04/2020/3:38:04 PM    Final      Labs:   Basic Metabolic Panel: Recent Labs  Lab 08/15/20 0142 08/15/20 0142 08/19/20 0227 08/19/20 0227 08/20/20 0800 08/21/20 0102  NA 136  --  138  --  134* 135  K 4.0   < > 4.6   < > 4.3 4.3  CL 99  --  101  --  100 103  CO2 28  --  26  --  23 20*  GLUCOSE 119*  --  129*  --  123* 127*  BUN 32*  --  39*  --  24* 22*  CREATININE 0.82  --  0.98  --  0.84 0.85  CALCIUM 9.6  --  10.0  --  9.2 9.3  MG 2.3  --  2.3  --   --   --   PHOS 3.5  --  2.4*  --  2.8  --    < > = values in this interval not displayed.   GFR Estimated Creatinine Clearance: 71.5 mL/min (by C-G formula based on SCr of 0.85 mg/dL). Liver Function Tests: No results for input(s): AST, ALT, ALKPHOS, BILITOT, PROT, ALBUMIN in the last 168 hours. No results for input(s): LIPASE, AMYLASE in the last 168 hours. No results for input(s): AMMONIA in the last 168 hours. Coagulation profile No results for input(s): INR, PROTIME in the last 168 hours.  CBC: Recent Labs  Lab 08/15/20 0142 08/19/20 0227 08/20/20 0800  WBC 7.0 10.9* 13.4*  NEUTROABS 3.9 7.8* 10.6*  HGB 13.5 14.3 12.3*  HCT 40.2 43.2 36.0*  MCV 74.3* 74.2* 73.5*  PLT 171 225 201   Cardiac Enzymes: No results for input(s): CKTOTAL, CKMB, CKMBINDEX, TROPONINI in the last 168 hours. BNP: Invalid input(s): POCBNP CBG: Recent Labs  Lab 08/20/20 1719 08/20/20 1959 08/21/20 0005 08/21/20 0426 08/21/20 0818  GLUCAP 109* 128* 134* 142* 161*   D-Dimer No results for input(s): DDIMER in the last 72 hours. Hgb A1c No results for input(s): HGBA1C in the last 72 hours. Lipid Profile No results for input(s): CHOL, HDL, LDLCALC, TRIG, CHOLHDL, LDLDIRECT in the last 72 hours. Thyroid function studies No results for input(s): TSH, T4TOTAL, T3FREE, THYROIDAB in the last 72 hours.  Invalid input(s): FREET3 Anemia work up No results for input(s): VITAMINB12, FOLATE, FERRITIN, TIBC, IRON, RETICCTPCT in the last  72 hours. Microbiology No results found for this or any previous visit (from the past 240 hour(s)).   Signed: Lorin GlassBinaya Everlina Gotts  [facility] 08/21/2020, 11:13 AM

## 2020-08-27 ENCOUNTER — Emergency Department (HOSPITAL_COMMUNITY)
Admission: EM | Admit: 2020-08-27 | Discharge: 2020-08-27 | Disposition: A | Discharge status: Home / Self Care | Payer: Medicaid Other | Attending: Emergency Medicine | Admitting: Emergency Medicine

## 2020-08-27 ENCOUNTER — Emergency Department (HOSPITAL_COMMUNITY): Payer: Medicaid Other

## 2020-08-27 ENCOUNTER — Ambulatory Visit: Payer: Self-pay | Admitting: *Deleted

## 2020-08-27 DIAGNOSIS — Y828 Other medical devices associated with adverse incidents: Secondary | ICD-10-CM | POA: Diagnosis not present

## 2020-08-27 DIAGNOSIS — R7989 Other specified abnormal findings of blood chemistry: Secondary | ICD-10-CM

## 2020-08-27 DIAGNOSIS — Z79899 Other long term (current) drug therapy: Secondary | ICD-10-CM | POA: Diagnosis not present

## 2020-08-27 DIAGNOSIS — K9421 Gastrostomy hemorrhage: Secondary | ICD-10-CM | POA: Diagnosis present

## 2020-08-27 DIAGNOSIS — F17210 Nicotine dependence, cigarettes, uncomplicated: Secondary | ICD-10-CM | POA: Insufficient documentation

## 2020-08-27 DIAGNOSIS — Z7982 Long term (current) use of aspirin: Secondary | ICD-10-CM | POA: Insufficient documentation

## 2020-08-27 DIAGNOSIS — T85848A Pain due to other internal prosthetic devices, implants and grafts, initial encounter: Secondary | ICD-10-CM | POA: Insufficient documentation

## 2020-08-27 DIAGNOSIS — I10 Essential (primary) hypertension: Secondary | ICD-10-CM | POA: Diagnosis not present

## 2020-08-27 DIAGNOSIS — R945 Abnormal results of liver function studies: Secondary | ICD-10-CM | POA: Diagnosis not present

## 2020-08-27 DIAGNOSIS — Z431 Encounter for attention to gastrostomy: Secondary | ICD-10-CM

## 2020-08-27 LAB — COMPREHENSIVE METABOLIC PANEL
ALT: 445 U/L — ABNORMAL HIGH (ref 0–44)
AST: 173 U/L — ABNORMAL HIGH (ref 15–41)
Albumin: 3.3 g/dL — ABNORMAL LOW (ref 3.5–5.0)
Alkaline Phosphatase: 81 U/L (ref 38–126)
Anion gap: 12 (ref 5–15)
BUN: 23 mg/dL — ABNORMAL HIGH (ref 6–20)
CO2: 27 mmol/L (ref 22–32)
Calcium: 9.8 mg/dL (ref 8.9–10.3)
Chloride: 98 mmol/L (ref 98–111)
Creatinine, Ser: 0.9 mg/dL (ref 0.61–1.24)
GFR calc non Af Amer: >60 mL/min (ref >60)
Glucose, Bld: 120 mg/dL — ABNORMAL HIGH (ref 70–99)
Potassium: 4.7 mmol/L (ref 3.5–5.1)
Sodium: 137 mmol/L (ref 135–145)
Total Bilirubin: 0.8 mg/dL (ref 0.3–1.2)
Total Protein: 7.4 g/dL (ref 6.5–8.1)

## 2020-08-27 LAB — CBC
HCT: 37.8 % — ABNORMAL LOW (ref 39.0–52.0)
Hemoglobin: 12.9 g/dL — ABNORMAL LOW (ref 13.0–17.0)
MCH: 25.4 pg — ABNORMAL LOW (ref 26.0–34.0)
MCHC: 34.1 g/dL (ref 30.0–36.0)
MCV: 74.4 fL — ABNORMAL LOW (ref 80.0–100.0)
Platelets: 363 10*3/uL (ref 150–400)
RBC: 5.08 MIL/uL (ref 4.22–5.81)
RDW: 14.7 % (ref 11.5–15.5)
WBC: 8.8 10*3/uL (ref 4.0–10.5)
nRBC: 0 % (ref 0.0–0.2)

## 2020-08-27 LAB — TYPE AND SCREEN
ABO/RH(D): O POS
Antibody Screen: NEGATIVE

## 2020-08-27 LAB — LIPASE, BLOOD: Lipase: 33 U/L (ref 11–51)

## 2020-08-27 NOTE — Discharge Instructions (Addendum)
PEG tube is working well when checked with contrast and x-ray. Blood work shows elevated liver function studies (LFTs). Ultrasound of the liver is unremarkable. Recommend repeat lab work with your doctor in 2 weeks.

## 2020-08-27 NOTE — ED Triage Notes (Addendum)
PT present to ED with complaints of bleeding in/around new PEG site since yesterday that became worse today. Pt was just discharged Wednesday, and denies change in feeding schedule or pain. HE nods yes that it may have gotten pulled. Pt nonverbal from CVA.

## 2020-08-27 NOTE — Telephone Encounter (Signed)
Patient's sister is calling to report patient is having bright red bleeding around the feeding tube and in the tube. Advised bring to ED for check of placement.  Reason for Disposition  Tube contains red blood or black ("coffee ground") material  (Exception: pink tinge of tube fluid occurs once and clears immediately with irrigation.)  Answer Assessment - Initial Assessment Questions 1. MAIN CONCERN OR SYMPTOM:  "What is your main concern right now?" "What question do you have?" "What's the main symptom you're worried about?" (e.g., fever, pain, redness, swelling)     Bleeding around feeding tube and blood can be seen in tube 2. ONSET: "When did the symptom or problem start (or worsen)?" (minutes, hours, days, weeks)     Patient has been home since Wednesday- blooding started yesterday and has increased today 3. WHAT TYPE: "What type of feeding tube is it?" (e.g., NG tube, NJ tube, G tube, GJ tube, J tube, PEG).     PEG 4. WHEN INSERTED: "When was the tube put in", "Has it been changed, if so when?"     While in hospital- patient has not had follow up at PCP yet 5. WHO INSERTED: "Do you recall who put the tube in?"     Surgeon at hospital 6. FEEDINGS: "Has the type, rate or concentration of the tube feedings changed?"     No changes in scheduled feeding- 6 cans/day 7. MEDICATIONS:  "Are you taking any medications via your feeding tube?"  "Are these medications liquid or crushed given in your feeding tube?"      Yes- crushed 8. VOMITING and DIARRHEA: "Is there new vomiting or diarrhea?" (e.g., number of time; description)     Soft stools- no vomiting 9. OTHER SYMPTOMS: "What other symptoms are you having?" (e.g., shortness of breath, fever, abdominal pain)     no 10. HOME HEALTH NURSE: "Do you have a home health (visiting) nurse?"       No- family members taking care  Protocols used: FEEDING TUBE SYMPTOMS AND QUESTIONS-A-AH

## 2020-08-27 NOTE — ED Provider Notes (Signed)
[facility] EMERGENCY DEPARTMENT Provider Note   CSN: [id number] Arrival date & time: 08/27/20  1524     History Chief Complaint  Patient presents with  . Post-op Problem    Adam [name] is a 58 y.o. male.  59 year old male presents the emergency room with concern for bleeding around his PEG tube.  PEG tube was placed on September 24 after a stroke due to decreased p.o. intake and drooling.  Patient states that the PEG tube was pulled on earlier today resulting in the bleeding, review of notes patient was sent to the ED for x-ray to verify placement.  Patient denies any other complaints at this time.  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        No past medical history on file.  Patient Active Problem List   Diagnosis Date Noted  . Dysphagia 08/21/2020  . AKI (acute kidney injury) ([place]) 08/07/2020  . Hypokalemia 08/07/2020  . Drooling 08/07/2020  . Acute CVA (cerebrovascular accident) ([place]) 08/04/2020  . HTN (hypertension) 08/04/2020  . B12 deficiency 08/04/2020  . Protein-calorie malnutrition, severe ([place]) 08/04/2020    Past Surgical History:  Procedure Laterality Date  . BUBBLE STUDY  08/09/2020   Procedure: BUBBLE STUDY;  Surgeon: Fay Records, MD;  Location: [facility];  Service: Cardiovascular;;  . IR GASTROSTOMY TUBE MOD SED  08/16/2020  . TEE WITHOUT CARDIOVERSION N/A 08/09/2020   Procedure: TRANSESOPHAGEAL ECHOCARDIOGRAM (TEE);  Surgeon: Fay Records, MD;  Location: [facility] ENDOSCOPY;  Service: Cardiovascular;  Laterality: N/A;       Family History  Problem Relation Age of Onset  . Cancer Mother   . Other Brother        shot    Social History   Tobacco Use  . Smoking status: Current Every Day Smoker    Types: Cigarettes  . Smokeless tobacco: Never Used  Vaping Use  . Vaping Use: Never used  Substance Use Topics  . Alcohol use: Not Currently    Alcohol/week: 1.0 standard  drink    Types: 1 Cans of beer per week  . Drug use: Not Currently    Types: Marijuana    Comment: cigarette 1 pck/week    Home Medications Prior to Admission medications   Medication Sig Start Date End Date Taking? Authorizing Provider  amoxicillin-clavulanate (AUGMENTIN) 875-125 MG tablet Place 1 tablet into feeding tube every 12 (twelve) hours for 7 days. 08/21/20 08/28/20  Terrilee Croak, MD  aspirin 81 MG chewable tablet Place 1 tablet (81 mg total) into feeding tube daily. 08/22/20 10/21/20  Terrilee Croak, MD  atorvastatin (LIPITOR) 80 MG tablet Place 1 tablet (80 mg total) into feeding tube daily. 08/22/20 10/21/20  Terrilee Croak, MD  carvedilol (COREG) 12.5 MG tablet Place 1 tablet (12.5 mg total) into feeding tube 2 (two) times daily with a meal. 08/21/20 10/20/20  Dahal, Marlowe Aschoff, MD  Multiple Vitamin (MULTIVITAMIN) LIQD Place 15 mLs into feeding tube daily. 08/22/20 09/21/20  Terrilee Croak, MD  Nutritional Supplements (FEEDING SUPPLEMENT, OSMOLITE 1.5 CAL,) LIQD Place 1,000 mLs into feeding tube continuous. Osmolite 1.2 cal @ 50ml/hr, 59ml Prosource TF dialy 08/21/20   Dahal, Marlowe Aschoff, MD  senna-docusate (SENOKOT-S) 8.6-50 MG tablet Take 1 tablet by mouth at bedtime. 08/21/20 10/20/20  Terrilee Croak, MD  thiamine 100 MG tablet Place 1 tablet (100 mg total) into feeding tube daily. 08/22/20 10/21/20  Terrilee Croak, MD  vitamin B-12 1000 MCG  tablet Place 1 tablet (1,000 mcg total) into feeding tube daily. 08/22/20 10/21/20  Terrilee Croak, MD    Allergies    Patient has no known allergies.  Review of Systems   Review of Systems  Constitutional: Negative for fever.  Gastrointestinal: Negative for abdominal pain and vomiting.  Skin: Positive for wound.    Physical Exam Updated Vital Signs BP (!) 117/97   Pulse 91   Temp 98.9 F (37.2 C) (Oral)   Resp 16   SpO2 100%   Physical Exam Vitals and nursing note reviewed.  Constitutional:      General: He is not in acute distress.     Appearance: He is not ill-appearing or toxic-appearing.  HENT:     Head: Normocephalic and atraumatic.  Pulmonary:     Effort: Pulmonary effort is normal.  Abdominal:     Palpations: Abdomen is soft.     Tenderness: There is no abdominal tenderness.    Skin:    General: Skin is warm and dry.     Findings: No erythema or rash.  Neurological:     Mental Status: He is alert.  Psychiatric:        Behavior: Behavior normal.     ED Results / Procedures / Treatments   Labs (all labs ordered are listed, but only abnormal results are displayed) Labs Reviewed  COMPREHENSIVE METABOLIC PANEL - Abnormal; Notable for the following components:      Result Value   Glucose, Bld 120 (*)    BUN 23 (*)    Albumin 3.3 (*)    AST 173 (*)    ALT 445 (*)    All other components within normal limits  CBC - Abnormal; Notable for the following components:   Hemoglobin 12.9 (*)    HCT 37.8 (*)    MCV 74.4 (*)    MCH 25.4 (*)    All other components within normal limits  LIPASE, BLOOD  TYPE AND SCREEN    EKG None  Radiology US Abdomen Limited  Result Date: 08/27/2020 CLINICAL DATA:  Elevated LFTs EXAM: ULTRASOUND ABDOMEN LIMITED RIGHT UPPER QUADRANT COMPARISON:  08/19/2020 FINDINGS: Gallbladder: No gallstones or wall thickening visualized. No sonographic Jaydis Duchene sign noted by sonographer. Common bile duct: Diameter: 2 mm Liver: No focal lesion identified. Within normal limits in parenchymal echogenicity. Portal vein is patent on color Doppler imaging with normal direction of blood flow towards the liver. Other: None. IMPRESSION: Unremarkable. Electronically Signed   By: Constance Holster M.D.   On: 08/27/2020 20:53   DG ABDOMEN PEG TUBE LOCATION  Result Date: 08/27/2020 CLINICAL DATA:  Peg tube EXAM: ABDOMEN - 1 VIEW COMPARISON:  None. FINDINGS: Injection of contrast is seen within the PEG tube within the mid body of the stomach. Contrast is seen filling the proximal portion of the stomach.  The bowel gas pattern is normal. No radio-opaque calculi or other significant radiographic abnormality are seen. IMPRESSION: Contrast injected through the PEG tube with no definite evidence of contrast extravasation. Electronically Signed   By: Prudencio Pair M.D.   On: 08/27/2020 19:57    Procedures Procedures (including critical care time)  Medications Ordered in ED Medications - No data to display  ED Course  I have reviewed the triage vital signs and the nursing notes.  Pertinent labs & imaging results that were available during my care of the patient were reviewed by me and considered in my medical decision making (see chart for details).  Clinical Course as of  Aug 27 2134  Tue Oct 05, 872  [id number] 59 year old male presents for PEG tube placement evaluation.  On exam has melena blood around the PEG tube which was cleaned and no further bleeding at this time.   [LM]  2134 Review of labs, found to have elevated LFTs with AST of 173, ALT of 445.  Normal bilirubin, normal alk phos, lipase within normal meds.  CBC without significant changes from prior.  Ultrasound right upper quadrant without acute findings relating to patient's elevated LFTs.  Recommend repeat lab work with PCP in 2 weeks.  X-ray with contrast shows functioning PEG tube.   [LM]    Clinical Course User Index [LM] Roque Lias   MDM Rules/Calculators/A&P                          Final Clinical Impression(s) / ED Diagnoses Final diagnoses:  Elevated LFTs  Pain around PEG tube site, initial encounter    Rx / DC Orders ED Discharge Orders    None       Roque Lias 08/27/20 2135    Valarie Merino, MD 08/30/20 [phone]

## 2020-09-02 ENCOUNTER — Inpatient Hospital Stay: Payer: Medicaid Other | Admitting: Family

## 2020-09-05 ENCOUNTER — Ambulatory Visit: Payer: Medicaid Other | Attending: Family Medicine | Admitting: Internal Medicine

## 2020-09-05 ENCOUNTER — Other Ambulatory Visit: Payer: Self-pay

## 2020-09-05 ENCOUNTER — Ambulatory Visit: Payer: Medicaid Other | Attending: Internal Medicine

## 2020-09-05 ENCOUNTER — Encounter: Payer: Self-pay | Admitting: Internal Medicine

## 2020-09-05 VITALS — BP 123/96 | HR 98 | Ht 72.0 in | Wt 123.2 lb

## 2020-09-05 DIAGNOSIS — M6281 Muscle weakness (generalized): Secondary | ICD-10-CM | POA: Diagnosis present

## 2020-09-05 DIAGNOSIS — R2689 Other abnormalities of gait and mobility: Secondary | ICD-10-CM

## 2020-09-05 DIAGNOSIS — R262 Difficulty in walking, not elsewhere classified: Secondary | ICD-10-CM | POA: Insufficient documentation

## 2020-09-05 DIAGNOSIS — F1021 Alcohol dependence, in remission: Secondary | ICD-10-CM | POA: Diagnosis not present

## 2020-09-05 DIAGNOSIS — E876 Hypokalemia: Secondary | ICD-10-CM | POA: Diagnosis not present

## 2020-09-05 DIAGNOSIS — E43 Unspecified severe protein-calorie malnutrition: Secondary | ICD-10-CM | POA: Diagnosis not present

## 2020-09-05 DIAGNOSIS — E538 Deficiency of other specified B group vitamins: Secondary | ICD-10-CM | POA: Diagnosis not present

## 2020-09-05 DIAGNOSIS — I69351 Hemiplegia and hemiparesis following cerebral infarction affecting right dominant side: Secondary | ICD-10-CM | POA: Diagnosis present

## 2020-09-05 DIAGNOSIS — I639 Cerebral infarction, unspecified: Secondary | ICD-10-CM

## 2020-09-05 DIAGNOSIS — I10 Essential (primary) hypertension: Secondary | ICD-10-CM

## 2020-09-05 DIAGNOSIS — R2681 Unsteadiness on feet: Secondary | ICD-10-CM | POA: Diagnosis present

## 2020-09-05 NOTE — Therapy (Signed)
[facility] [address] [place], [place], [address] Phone: [phone]   Fax:  [phone]  Physical Therapy Evaluation  Patient Details  Name: Adam KussmaulClinton [name] MRN: [id number] Date of Birth: 01/01/1961 Referring Provider (PT): Roberto ScalesShayla Nettey, MD. Followed by Ihor AustinJessica McCue, NP   Encounter Date: 09/05/2020   PT End of Session - 09/05/20 1015    Visit Number 1    Number of Visits 8    Date for PT Re-Evaluation 12/04/20   POC for 7 weeks, Cert for 90 days   Authorization Type Self Pay Until MCD Switched. Currently [facility]    PT Start Time [phone]    PT Stop Time 1015    PT Time Calculation (min) 39 min    Equipment Utilized During Treatment Gait belt    Activity Tolerance Patient limited by fatigue;No increased pain    Behavior During Therapy Flat affect;Impulsive           No past medical history on file.  Past Surgical History:  Procedure Laterality Date  . BUBBLE STUDY  08/09/2020   Procedure: BUBBLE STUDY;  Surgeon: Pricilla Riffleoss, Paula V, MD;  [facility];  Service: Cardiovascular;;  . IR GASTROSTOMY TUBE MOD SED  08/16/2020  . TEE WITHOUT CARDIOVERSION N/A 08/09/2020   Procedure: TRANSESOPHAGEAL ECHOCARDIOGRAM (TEE);  Surgeon: Pricilla Riffleoss, Paula V, MD;  [facility];  Service: Cardiovascular;  Laterality: N/A;    There were no vitals filed for this visit.    Subjective Assessment - 09/05/20 0941    Subjective Wife reports that he had drooling and weakness and was taken to hospital diagonsed with Acute R MCA. 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 Patient currently has a PEG Tube. Patient has had one fall since being home, reports that R leg gave out on him. Patient is aphasic, can respond to simple yes/no. No pain to report. Patient is not currently using an AD at this time.    Patient is accompained by: Family member   Sister (Clara)   Pertinent History HTN, prior stroke (residual  right-sided weakness and dysarthria)    Limitations Standing;Walking;House hold activities    Patient Stated Goals Get Stronger    Currently in Pain? No/denies              [facility] PT Assessment - 09/05/20 0001      Assessment   Medical Diagnosis Acute R MCA    Referring Provider (PT) Roberto ScalesShayla Nettey, MD. Followed by Ihor AustinJessica McCue, NP    Onset Date/Surgical Date 08/03/20    Hand Dominance Right    Prior Therapy Inpatient Rehab      Precautions   Precautions Fall      Balance Screen   Has the patient fallen in the past 6 months Yes    How many times? 1    Has the patient had a decrease in activity level because of a fear of falling?  Yes    Is the patient reluctant to leave their home because of a fear of falling?  Yes      Home Environment   Living Environment Private residence    Living Arrangements Other relatives   lives with sister   Available Help at Discharge Family    Type of Home Apartment    Home Access Stairs to enter    Entrance Stairs-Number of Steps 4    Entrance Stairs-Rails Left    Home Layout One level    Clinical biochemistHome Equipment Shower  seat      Prior Function   Level of Independence Independent with household mobility without device;Independent with basic ADLs;Independent with community mobility without device    Vocation On disability      Cognition   Overall Cognitive Status Within Functional Limits for tasks assessed      Sensation   Light Touch Appears Intact    Additional Comments patient reports no differences in sensation on R/L, no numbness. difficult to assess due to aphasia      Coordination   Gross Motor Movements are Fluid and Coordinated No      ROM / Strength   AROM / PROM / Strength AROM;Strength      AROM   Overall AROM  Deficits    Overall AROM Comments due to strength deficits on RLE      Strength   Overall Strength Deficits    Strength Assessment Site Hip;Knee;Ankle    Right/Left Hip Right;Left    Right Hip Flexion 4-/5    Right  Hip ABduction 3+/5    Right Hip ADduction 4-/5    Left Hip Flexion 4+/5    Left Hip ABduction 4+/5    Left Hip ADduction 4+/5    Right/Left Knee Right;Left    Right Knee Flexion 4/5    Right Knee Extension 4/5    Left Knee Flexion 4+/5    Left Knee Extension 4+/5    Right/Left Ankle Right;Left    Right Ankle Dorsiflexion 3+/5    Left Ankle Dorsiflexion 4+/5      Bed Mobility   Bed Mobility Rolling Left;Rolling Right;Supine to Sit;Sit to Supine   per patient/sister reports   Rolling Right Independent    Rolling Left Independent    Supine to Sit Independent    Sit to Supine Independent      Transfers   Transfers Sit to Stand;Stand to Sit    Sit to Stand 4: Min guard    Five time sit to stand comments  23.09 secs with UE support from standard height chair    Stand to Sit 4: Min guard      Ambulation/Gait   Ambulation/Gait Yes    Ambulation/Gait Assistance 4: Min guard    Ambulation Distance (Feet) 115 Feet    Assistive device None    Gait Pattern Step-through pattern;Decreased arm swing - right;Decreased step length - right;Decreased step length - left;Decreased stance time - right;Decreased dorsiflexion - right;Narrow base of support;Poor foot clearance - right    Ambulation Surface Level;Indoor    Gait velocity 40.03 secs = 0.82 ft/sec    Stairs Yes    Stairs Assistance 4: Min guard    Stair Management Technique Two rails;Alternating pattern;Step to pattern;Forwards    Number of Stairs 4    Height of Stairs 6    Gait Comments Patient ambulating without AD at this time, but demo high fall risk requiring CGA throughout session with ambulation. With stairs patient demo inconsitent step to and alternating pattern. PT eduating on proper ascending/descending pattern as well as completing step to pattern at this time for improved safety.       Standardized Balance Assessment   Standardized Balance Assessment Timed Up and Go Test      Timed Up and Go Test   TUG Normal TUG     Normal TUG (seconds) 27.25    TUG Comments w/o AD  Objective measurements completed on examination: See above findings.               PT Education - 09/05/20 1210    Education Details POC and Evaluation Findings; Need for use of RW at this time due to increased fall risk and improved safety    Person(s) Educated Patient;Other (comment)   Sister   Methods Explanation    Comprehension Verbalized understanding            PT Short Term Goals - 09/05/20 1211      PT SHORT TERM GOAL #1   Title Patient will be independent with initial HEP for strengtening/balance with caregiver assistance (ALL STGs Due after Initial 3 Visit)    Baseline no HEP established    Time 3    Period --   visits   Status New      PT SHORT TERM GOAL #2   Title Patient will improve TUG to <20 seconds with LRAD to demonstrate reduced fall risk    Baseline 27.25    Time 3    Period --   visits   Status New      PT SHORT TERM GOAL #3   Title Patient will demo ability to ambulate > 300 ft with CGA with LRAD to demonstrate improved household mobility    Baseline 115, CGA no AD    Time 3    Period --   visits   Status New      PT SHORT TERM GOAL #4   Title Patient will improve 5x sit <> stand to </= 20 secs to demonstrate improved functional strength    Baseline 23.09    Time 3    Period --   visits   Status New             PT Long Term Goals - 09/05/20 1214      PT LONG TERM GOAL #1   Title Patient will be independent with final HEP for balance/strength with caregiver assistance (All LTGs due after 7th Visit)    Baseline no HEP established    Time 7    Period --   visits   Status New      PT LONG TERM GOAL #2   Title Patient will demo ability to complete 5x sit <> stand in </= 12 seconds to demonstrate improved functional strength    Baseline 23.09    Time 7    Period --   visits   Status New      PT LONG TERM GOAL #3   Title Patient will demo  ability to ascend/descend 4 stairs, alternating pattern, with single rail on L, supervision for improved entrance/exit of home environment    Baseline CGA step to pattern, bilateral rails    Time 7    Period --   visits   Status New      PT LONG TERM GOAL #4   Title Patient will improve TUG to </= 15 seconds w/ LRAD to demo reduced fall risk    Baseline 27.25 secs    Time 7    Period --   visits   Status New      PT LONG TERM GOAL #5   Title Patient will improve gait speed to >/= 1.5 ft/sec to demo improved household mobility    Baseline 0.82 ft/sec    Time 7    Period --   visits   Status New      Additional  Long Term Goals   Additional Long Term Goals Yes      PT LONG TERM GOAL #6   Title Patient will demo ability to ambulate on indoor/outdoor surfaces >/=  500 ft with LRAD and supervision    Baseline 115 ft CGA    Time 7    Period --   visits   Status New                  Plan - 09/05/20 1201    Clinical Impression Statement Patient is a 59 y.o. male that was referred to neuro OPPT services for Acute R MCA. Patient PMH is significant for the following: HTN, prior stroke (residual right-sided weakness and dysarthria). Patient is currently using a PEG Tube and is NPO. Patient presents with the following impairments upon evaluation: decreased strength, abnormal gait, decreased balance, decreased coordination, and increased risk for falls. Patient is aphasic and is able to answer with yes/no, and simple statements. Patient is currently ambulating at 0.82 ft/sec demonstrating household mobility. With TUG score of 27.25 secs and gait speed, patient demonstrates being at a signficant fall risk at this time. PT educating on use of RW to promote improved safety with ambulation at this time. Patient will benefit from skilled PT services to progress toward all goals and maximize functional mobility.    Personal Factors and Comorbidities Comorbidity 2    Comorbidities HTN, prior  stroke (residual right-sided weakness and dysarthria)    Examination-Activity Limitations Stand;Stairs;Locomotion Systems developer Activity;Yard Work    Conservation officer, historic buildings Evolving/Moderate complexity    Clinical Decision Making Moderate    Rehab Potential Fair    PT Frequency 1x / week    PT Duration 3 weeks   followed by 1x/week for 4 weeks   PT Treatment/Interventions ADLs/Self Care Home Management;Electrical Stimulation;DME Instruction;Gait training;Stair training;Functional mobility training;Therapeutic activities;Therapeutic exercise;Balance training;Neuromuscular re-education;Patient/family education;Orthotic Fit/Training;Manual techniques;Passive range of motion    PT Next Visit Plan Complete Berg Balance. Initiate HEP for strengthening/balance    Recommended Other Services Occupational therapy and Speech therapy    Consulted and Agree with Plan of Care Patient;Family member/caregiver    Family Member Consulted Sister (Clara)           Patient will benefit from skilled therapeutic intervention in order to improve the following deficits and impairments:  Abnormal gait, Decreased balance, Decreased endurance, Decreased mobility, Difficulty walking, Pain, Decreased strength, Decreased safety awareness, Decreased knowledge of use of DME, Decreased activity tolerance, Decreased knowledge of precautions, Decreased range of motion  Visit Diagnosis: Other abnormalities of gait and mobility  Difficulty in walking, not elsewhere classified  Muscle weakness (generalized)  Unsteadiness on feet  Hemiplegia and hemiparesis following cerebral infarction affecting right dominant side [facility])     Problem List Patient Active Problem List   Diagnosis Date Noted  . Dysphagia 08/21/2020  . AKI (acute kidney injury) (HCC) 08/07/2020  . Hypokalemia 08/07/2020  . Drooling 08/07/2020  . Acute CVA  (cerebrovascular accident) (HCC) 08/04/2020  . HTN (hypertension) 08/04/2020  . B12 deficiency 08/04/2020  . Protein-calorie malnutrition, severe (HCC) 08/04/2020    Tempie Donning, PT, DPT 09/05/2020, 12:28 PM  [facility] [address] [place], [place], [address] Phone: [phone]   Fax:  [phone]  Name: Joey Lierman MRN: [id number] Date of Birth: 1961-07-23

## 2020-09-05 NOTE — Assessment & Plan Note (Signed)
He is receiving therapy for speech and swallowing. Continue npo until cleared by SLP

## 2020-09-05 NOTE — Assessment & Plan Note (Signed)
He has been receiving tube feedings- will continue.  He is now receiving adequate nutrition and hydration

## 2020-09-05 NOTE — Assessment & Plan Note (Signed)
Lab Results  Component Value Date   K 4.7 08/27/2020   Resolved- i'll remove from the problem list

## 2020-09-05 NOTE — Progress Notes (Addendum)
Needs referral to podiatry.   Comes in for f/u after stroke He is with sister who brought in paper work to be completed.  Home healthcare is following with him.   He has a peg tube- tolerating feedings. Nothing by mouth. Sister is quite knowledgeable about feedings and water flushing.    History reviewed. No pertinent past medical history.  Social History   Socioeconomic History  . Marital status: Single    Spouse name: Not on file  . Number of children: 5  . Years of education: Not on file  . Highest education level: Not on file  Occupational History  . Not on file  Tobacco Use  . Smoking status: Current Every Day Smoker    Types: Cigarettes  . Smokeless tobacco: Never Used  Vaping Use  . Vaping Use: Never used  Substance and Sexual Activity  . Alcohol use: Not Currently    Alcohol/week: 1.0 standard drink    Types: 1 Cans of beer per week  . Drug use: Not Currently    Types: Marijuana    Comment: cigarette 1 pck/week  . Sexual activity: Yes    Partners: Female    Birth control/protection: Condom  Other Topics Concern  . Not on file  Social History Narrative  . Not on file   Social Determinants of Health   Financial Resource Strain:   . Difficulty of Paying Living Expenses: Not on file  Food Insecurity:   . Worried About Programme researcher, broadcasting/film/video in the Last Year: Not on file  . Ran Out of Food in the Last Year: Not on file  Transportation Needs:   . Lack of Transportation (Medical): Not on file  . Lack of Transportation (Non-Medical): Not on file  Physical Activity:   . Days of Exercise per Week: Not on file  . Minutes of Exercise per Session: Not on file  Stress:   . Feeling of Stress : Not on file  Social Connections:   . Frequency of Communication with Friends and Family: Not on file  . Frequency of Social Gatherings with Friends and Family: Not on file  . Attends Religious Services: Not on file  . Active Member of Clubs or Organizations: Not on file  .  Attends Banker Meetings: Not on file  . Marital Status: Not on file  Intimate Partner Violence:   . Fear of Current or Ex-Partner: Not on file  . Emotionally Abused: Not on file  . Physically Abused: Not on file  . Sexually Abused: Not on file    Past Surgical History:  Procedure Laterality Date  . BUBBLE STUDY  08/09/2020   Procedure: BUBBLE STUDY;  Surgeon: Pricilla Riffle, MD;  Location: [facility] ENDOSCOPY;  Service: Cardiovascular;;  . IR GASTROSTOMY TUBE MOD SED  08/16/2020  . TEE WITHOUT CARDIOVERSION N/A 08/09/2020   Procedure: TRANSESOPHAGEAL ECHOCARDIOGRAM (TEE);  Surgeon: Pricilla Riffle, MD;  Location: [facility] ENDOSCOPY;  Service: Cardiovascular;  Laterality: N/A;    Family History  Problem Relation Age of Onset  . Cancer Mother   . Other Brother        shot    No Known Allergies  Current Outpatient Medications on File Prior to Visit  Medication Sig Dispense Refill  . aspirin 81 MG chewable tablet Place 1 tablet (81 mg total) into feeding tube daily. 60 tablet 0  . atorvastatin (LIPITOR) 80 MG tablet Place 1 tablet (80 mg total) into feeding tube daily. 60 tablet 0  . carvedilol (  COREG) 12.5 MG tablet Place 1 tablet (12.5 mg total) into feeding tube 2 (two) times daily with a meal. 120 tablet 0  . Multiple Vitamin (MULTIVITAMIN) LIQD Place 15 mLs into feeding tube daily. 450 mL 0  . Nutritional Supplements (FEEDING SUPPLEMENT, OSMOLITE 1.5 CAL,) LIQD Place 1,000 mLs into feeding tube continuous. Osmolite 1.2 cal @ 20ml/hr, 42ml Prosource TF dialy 1000 mL 10  . senna-docusate (SENOKOT-S) 8.6-50 MG tablet Take 1 tablet by mouth at bedtime. 60 tablet 0  . thiamine 100 MG tablet Place 1 tablet (100 mg total) into feeding tube daily. 60 tablet 0  . vitamin B-12 1000 MCG tablet Place 1 tablet (1,000 mcg total) into feeding tube daily. 60 tablet 0   No current facility-administered medications on file prior to visit.     patient denies chest pain, shortness of breath,  orthopnea. Denies lower extremity edema, abdominal pain, change in appetite, change in bowel movements. Patient denies rashes, musculoskeletal complaints. No other specific complaints in a complete review of systems.   BP (!) 123/96   Pulse 98   Ht 6' (1.829 m)   Wt 123 lb 3.2 oz (55.9 kg)   SpO2 100%   BMI 16.71 kg/m  Ill appearing  male in no acute distress. HEENT exam atraumatic, normocephalic, neck supple without jugular venous distention. Chest clear to auscultation cardiac exam S1-S2 are regular. Abdominal exam thin/cachectic, with bowel sounds, soft and nontender. Extremities no edema. Neurologic exam is alert with a normal gait.  .Alcohol use disorder, severe, in sustained remission, dependence (HCC) He has quit drinking after stroke (no access) Encouraged continued abstinence (he remains npo for the time being)  B12 deficiency Lab Results  Component Value Date   VITAMINB12 90 (L) 08/03/2020   He has been treated- suspect will resolve  Protein-calorie malnutrition, severe (HCC) He has been receiving tube feedings- will continue.  He is now receiving adequate nutrition and hydration  Hypokalemia Lab Results  Component Value Date   K 4.7 08/27/2020   Resolved- i'll remove from the problem list  Acute CVA (cerebrovascular accident) [facility]) He is receiving therapy for speech and swallowing. Continue npo until cleared by SLP  HTN (hypertension)  BP Readings from Last 3 Encounters:  09/05/20 (!) 123/96  08/27/20 136/90  08/21/20 123/73   Well controlled currently  Has plantar wart- will wait to treat for now.   Elevated lfts- will reevaluate with lab work. I suspect this is related to previous alcohol use and now with Tube Feeding

## 2020-09-05 NOTE — Assessment & Plan Note (Signed)
  BP Readings from Last 3 Encounters:  09/05/20 (!) 123/96  08/27/20 136/90  08/21/20 123/73   Well controlled currently

## 2020-09-05 NOTE — Assessment & Plan Note (Signed)
He has quit drinking after stroke (no access) Encouraged continued abstinence (he remains npo for the time being)

## 2020-09-05 NOTE — Addendum Note (Signed)
Addended by: Lindley Magnus on: 09/05/2020 03:05 PM   Modules accepted: Orders

## 2020-09-05 NOTE — Assessment & Plan Note (Signed)
Lab Results  Component Value Date   VITAMINB12 90 (L) 08/03/2020   He has been treated- suspect will resolve

## 2020-09-06 LAB — BASIC METABOLIC PANEL
BUN/Creatinine Ratio: 32 — ABNORMAL HIGH (ref 9–20)
BUN: 30 mg/dL — ABNORMAL HIGH (ref 6–24)
CO2: 20 mmol/L (ref 20–29)
Calcium: 9.6 mg/dL (ref 8.7–10.2)
Chloride: 103 mmol/L (ref 96–106)
Creatinine, Ser: 0.93 mg/dL (ref 0.76–1.27)
GFR calc Af Amer: 104 mL/min/{1.73_m2} (ref >59)
GFR calc non Af Amer: 90 mL/min/{1.73_m2} (ref >59)
Glucose: 105 mg/dL — ABNORMAL HIGH (ref 65–99)
Potassium: 5.1 mmol/L (ref 3.5–5.2)
Sodium: 142 mmol/L (ref 134–144)

## 2020-09-06 LAB — CBC WITH DIFFERENTIAL/PLATELET
Basophils Absolute: 0 10*3/uL (ref 0.0–0.2)
Basos: 1 %
EOS (ABSOLUTE): 0.1 10*3/uL (ref 0.0–0.4)
Eos: 2 %
Hematocrit: 40.1 % (ref 37.5–51.0)
Hemoglobin: 13.1 g/dL (ref 13.0–17.7)
Immature Grans (Abs): 0 10*3/uL (ref 0.0–0.1)
Immature Granulocytes: 0 %
Lymphocytes Absolute: 1.6 10*3/uL (ref 0.7–3.1)
Lymphs: 28 %
MCH: 25 pg — ABNORMAL LOW (ref 26.6–33.0)
MCHC: 32.7 g/dL (ref 31.5–35.7)
MCV: 77 fL — ABNORMAL LOW (ref 79–97)
Monocytes Absolute: 0.8 10*3/uL (ref 0.1–0.9)
Monocytes: 13 %
Neutrophils Absolute: 3.2 10*3/uL (ref 1.4–7.0)
Neutrophils: 56 %
Platelets: 255 10*3/uL (ref 150–450)
RBC: 5.24 x10E6/uL (ref 4.14–5.80)
RDW: 15.2 % (ref 11.6–15.4)
WBC: 5.7 10*3/uL (ref 3.4–10.8)

## 2020-09-06 LAB — HEPATIC FUNCTION PANEL
ALT: 255 IU/L — ABNORMAL HIGH (ref 0–44)
AST: 126 IU/L — ABNORMAL HIGH (ref 0–40)
Albumin: 3.9 g/dL (ref 3.8–4.9)
Alkaline Phosphatase: 97 IU/L (ref 44–121)
Bilirubin Total: <0.2 mg/dL (ref 0.0–1.2)
Bilirubin, Direct: <0.1 mg/dL (ref 0.00–0.40)
Total Protein: 7.6 g/dL (ref 6.0–8.5)

## 2020-09-11 ENCOUNTER — Ambulatory Visit: Payer: Medicaid Other | Admitting: Physical Therapy

## 2020-09-16 ENCOUNTER — Ambulatory Visit: Payer: Medicaid Other

## 2020-09-16 ENCOUNTER — Ambulatory Visit: Payer: Medicaid Other | Admitting: Rehabilitation

## 2020-09-23 ENCOUNTER — Ambulatory Visit: Payer: Medicaid Other | Attending: Internal Medicine | Admitting: Rehabilitation

## 2020-09-23 ENCOUNTER — Encounter: Payer: Self-pay | Admitting: Rehabilitation

## 2020-09-23 ENCOUNTER — Other Ambulatory Visit: Payer: Self-pay

## 2020-09-23 ENCOUNTER — Ambulatory Visit: Payer: Medicaid Other | Admitting: Occupational Therapy

## 2020-09-23 DIAGNOSIS — R2689 Other abnormalities of gait and mobility: Secondary | ICD-10-CM | POA: Diagnosis not present

## 2020-09-23 DIAGNOSIS — R471 Dysarthria and anarthria: Secondary | ICD-10-CM | POA: Insufficient documentation

## 2020-09-23 DIAGNOSIS — R278 Other lack of coordination: Secondary | ICD-10-CM | POA: Diagnosis present

## 2020-09-23 DIAGNOSIS — R2681 Unsteadiness on feet: Secondary | ICD-10-CM

## 2020-09-23 DIAGNOSIS — R262 Difficulty in walking, not elsewhere classified: Secondary | ICD-10-CM | POA: Insufficient documentation

## 2020-09-23 DIAGNOSIS — I69351 Hemiplegia and hemiparesis following cerebral infarction affecting right dominant side: Secondary | ICD-10-CM | POA: Diagnosis present

## 2020-09-23 DIAGNOSIS — R41841 Cognitive communication deficit: Secondary | ICD-10-CM | POA: Diagnosis present

## 2020-09-23 DIAGNOSIS — M6281 Muscle weakness (generalized): Secondary | ICD-10-CM | POA: Insufficient documentation

## 2020-09-23 DIAGNOSIS — R1312 Dysphagia, oropharyngeal phase: Secondary | ICD-10-CM | POA: Diagnosis present

## 2020-09-23 NOTE — Therapy (Signed)
[facility] [address] [place], [place], [address] Phone: [phone]   Fax:  [phone]  Occupational Therapy Evaluation  Patient Details  Name: Adam [name] MRN: [id number] Date of Birth: [birth year] No data recorded  Encounter Date: 09/23/2020   OT End of Session - 09/23/20 1355    Visit Number 1    Number of Visits 9    Date for OT Re-Evaluation 10/23/20    Authorization Type MCD - Out of network    OT Start Time 1310    OT Stop Time 1348    OT Time Calculation (min) 38 min    Activity Tolerance Patient tolerated treatment well    Behavior During Therapy Flat affect           No past medical history on file.  Past Surgical History:  Procedure Laterality Date  . BUBBLE STUDY  08/09/2020   Procedure: BUBBLE STUDY;  Surgeon: Pricilla Riffle, MD;  Location: [facility] ENDOSCOPY;  Service: Cardiovascular;;  . IR GASTROSTOMY TUBE MOD SED  08/16/2020  . TEE WITHOUT CARDIOVERSION N/A 08/09/2020   Procedure: TRANSESOPHAGEAL ECHOCARDIOGRAM (TEE);  Surgeon: Pricilla Riffle, MD;  Location: [facility] ENDOSCOPY;  Service: Cardiovascular;  Laterality: N/A;    There were no vitals filed for this visit.   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 Yes   unable to assess further d/t aphasia and no family present for evaluation   Pain Location Rib cage    Pain Orientation Right             [facility] OT Assessment - 09/23/20 0001      Assessment   Medical Diagnosis Acute R MCA   Lt hemiparesis   Onset Date/Surgical Date 08/03/20    Hand Dominance Right    Prior Therapy Inpatient Rehab      Precautions   Precautions Fall;Other (comment)    Precaution Comments NPO, peg tube       Balance Screen   Has the patient fallen in the past 6 months No    Has the patient had a decrease in activity level because of a fear of falling?  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    Additional Comments Pt lives with sister and husband on 1st floor apartment      Prior Function   Level of Independence Independent   MD notes report mild weakness Rt side from CVA in 2013   Vocation On disability      ADL   Eating/Feeding Modified independent    Grooming Modified independent    Upper Body Bathing Modified independent    Lower Body Bathing Modified independent    Upper Body Dressing Increased time    Lower Body Dressing Modified independent    Toilet Transfer Modified independent    Toileting -  Hygiene Modified Independent    Tub/Shower Transfer Minimal assistance      IADL   Shopping Needs to be accompanied on any shopping trip    Light Housekeeping Does not participate in any housekeeping tasks    Meal Prep Needs to have meals prepared and served   currently  NPO - uses peg tube   Community Mobility Relies on family or friends for transportation      Mobility   Mobility Status Comments walks very slowly w/o device, but reports he needs cane      Written Expression   Dominant Hand Right    Handwriting 90% legible;Mild micrographia      Vision - History   Baseline Vision No visual deficits    Additional Comments denies changes from stroke      Cognition   Overall Cognitive Status Difficult to assess   yes/no questions seem consistent     Observation/Other Assessments   Observations Pt reports aphasia from recent stroke (per yes/no responses) but chart reports dysarthria from first stroke, pt drools t/o evaluation. Pt appears to be more dysarthric than aphasic - speech referral was sent via EPIC but never scheduled - will schedule today      Sensation   Light Touch Appears Intact      Coordination   9 Hole Peg  Test Right;Left    Right 9 Hole Peg Test 34.84 sec    Left 9 Hole Peg Test 45.94 sec      Tone   Assessment Location Left Upper Extremity      ROM / Strength   AROM / PROM / Strength AROM;Strength      AROM   Overall AROM Comments RUE WNL's, LUE WFL's      Strength   Overall Strength Deficits    Overall Strength Comments RUE MMT grossly 4+/5, LUE MMT grossly 4/5 at shoulder      Hand Function   Right Hand Grip (lbs) 82 lbs    Left Hand Grip (lbs) 79 lbs      LUE Tone   LUE Tone Mild   minimal with finger extension only                               OT Long Term Goals - 09/23/20 1400      OT LONG TERM GOAL #1   Title Independent with coordination HEP and LUE strengthening HEP (low range theraband)    Baseline dependent    Time 4    Period Weeks    Status New      OT LONG TERM GOAL #2   Title Improve coordination Lt hand as evidenced by performing 9 hole peg test in 38 sec or less    Baseline 45.94 sec    Time 4    Period Weeks    Status New      OT LONG TERM GOAL #3   Title Pt to perform light household tasks (folding towels, making bed) at mod I level safely    Baseline dependent    Time 4    Period Weeks    Status New      OT LONG TERM GOAL #4   Title Pt to verbalize understanding with any AE and DME recommendations for greater ease/safety    Baseline dependent    Time 4    Period Weeks    Status New                 Plan - 09/23/20 1356    Clinical Impression Statement Pt is a 59 y.o. male who presents to OPOT s/p Rt MCA CVA on 08/03/20 with mild residual Lt hemiparesis affecting mostly coordination. Pt also significantly dysarthric and has peg tube as he is NPO. Pt  would benefit from a short amount of time with O.T. to address deficits in coordination, strength, functional balance and id any A/E and DME needs to improve safety and independence with ADLS.    OT Occupational Profile and History Detailed Assessment- Review of  Records and additional review of physical, cognitive, psychosocial history related to current functional performance    Occupational performance deficits (Please refer to evaluation for details): ADL's;IADL's    Body Structure / Function / Physical Skills IADL;ADL;UE functional use;Coordination;Balance;Mobility;FMC    Rehab Potential Good    Clinical Decision Making Several treatment options, min-mod task modification necessary    Modification or Assistance to Complete Evaluation  Min-Moderate modification of tasks or assist with assess necessary to complete eval    OT Frequency 2x / week    OT Duration 4 weeks   PLUS eval   OT Treatment/Interventions Self-care/ADL training;DME and/or AE instruction;Therapeutic activities;Therapeutic exercise;Coping strategies training;Neuromuscular education;Functional Mobility Training;Passive range of motion;Manual Therapy;Patient/family education    Plan coordination HEP, assess A/E and DME needs - pt has shower seat but may benefit from tub bench    Recommended Other Services speech therapy (referral sent but was not scheduled)    Consulted and Agree with Plan of Care Patient           Patient will benefit from skilled therapeutic intervention in order to improve the following deficits and impairments:   Body Structure / Function / Physical Skills: IADL, ADL, UE functional use, Coordination, Balance, Mobility, FMC       Visit Diagnosis: Other lack of coordination  Muscle weakness (generalized)  Unsteadiness on feet    Problem List Patient Active Problem List   Diagnosis Date Noted  . Alcohol use disorder, severe, in sustained remission, dependence (HCC) 09/05/2020  . Dysphagia 08/21/2020  . AKI (acute kidney injury) (HCC) 08/07/2020  . Hypokalemia 08/07/2020  . Drooling 08/07/2020  . Acute CVA (cerebrovascular accident) (HCC) 08/04/2020  . HTN (hypertension) 08/04/2020  . B12 deficiency 08/04/2020  . Protein-calorie malnutrition,  severe (HCC) 08/04/2020    Kelli Churn, OTR/L 09/23/2020, 2:04 PM  [facility] [address] [place], [place], [address] Phone: [phone]   Fax:  [phone]  Name: Adam [name] MRN: [id number] Date of Birth: [birth year]

## 2020-09-23 NOTE — Therapy (Signed)
[facility] [address] [place], [place], [address] Phone: [phone]   Fax:  [phone]  Physical Therapy Treatment  Patient Details  Name: Adam [name] MRN: [id number] Date of Birth: 03-11-1961 Referring Provider (PT): Roberto Scales, MD. Followed by Ihor Austin, NP   Encounter Date: 09/23/2020   PT End of Session - 09/23/20 1607    Visit Number 2    Number of Visits 16   updated POC   Date for PT Re-Evaluation 12/04/20   POC for 7 weeks, Cert for 90 days   Authorization Type Self Pay Until MCD Switched. Currently [facility] MCD    PT Start Time 1400    PT Stop Time 1445    PT Time Calculation (min) 45 min    Equipment Utilized During Treatment Gait belt    Activity Tolerance Patient limited by fatigue;No increased pain    Behavior During Therapy Flat affect;Impulsive           History reviewed. No pertinent past medical history.  Past Surgical History:  Procedure Laterality Date  . BUBBLE STUDY  08/09/2020   Procedure: BUBBLE STUDY;  Surgeon: Pricilla Riffle, MD;  Location: [facility] ENDOSCOPY;  Service: Cardiovascular;;  . IR GASTROSTOMY TUBE MOD SED  08/16/2020  . TEE WITHOUT CARDIOVERSION N/A 08/09/2020   Procedure: TRANSESOPHAGEAL ECHOCARDIOGRAM (TEE);  Surgeon: Pricilla Riffle, MD;  Location: [facility] ENDOSCOPY;  Service: Cardiovascular;  Laterality: N/A;    There were no vitals filed for this visit.   Subjective Assessment - 09/23/20 1401    Subjective Pt shakes "yes" to pain and points to R side of back.    Pertinent History HTN, prior stroke (residual right-sided weakness and dysarthria)    Limitations Standing;Walking;House hold activities    Patient Stated Goals Get Stronger    Currently in Pain? Yes   unable to verbalize number but nods yes to "a little bit"   Pain Score 3     Pain Location Back    Pain Orientation Mid;Right    Pain Descriptors / Indicators Tightness    Pain Type Acute pain     Pain Onset In the past 7 days    Pain Frequency Constant                             [facility] Adult PT Treatment/Exercise - 09/23/20 1405      Transfers   Transfers Sit to Stand;Stand to Sit    Sit to Stand 5: Supervision    Sit to Stand Details Verbal cues for sequencing;Verbal cues for technique    Sit to Stand Details (indicate cue type and reason) Cues and demo for increased forward trunk lean/weight shift.     Five time sit to stand comments  14.69 secs with BUE support from standard arm chair    Stand to Sit 5: Supervision    Stand to Sit Details (indicate cue type and reason) Verbal cues for sequencing;Verbal cues for technique    Stand to Sit Details Cues for controlled descent       Ambulation/Gait   Ambulation/Gait Yes    Ambulation/Gait Assistance 4: Min guard;5: Supervision    Ambulation/Gait Assistance Details Pt CGA throughout for gait without device x 150', therefore trialed use of both quad tip cane and RW during session to assess safety.  Pt requires max cues and demo for safe use of quad tip cane, however did very well with  RW and is able to ambulate at S level with cues for posture, increased stride length and width, and improved heel strike.  Discussed this with sister at end of session and she is supportive of getting RW.     Ambulation Distance (Feet) 150 Feet   then 115 x 2 reps    Assistive device Rolling walker;Straight cane;None   quad tip on cane    Gait Pattern Step-through pattern;Decreased arm swing - right;Decreased step length - right;Decreased step length - left;Decreased stance time - right;Decreased dorsiflexion - right;Narrow base of support;Poor foot clearance - right    Ambulation Surface Level;Indoor      Exercises   Exercises Other Exercises    Other Exercises  Initiated HEP for standing BLE strength.  See pt instruction for details.               Access Code: [email or web address] URL: [email or web address] Date:  09/23/2020 Prepared by: Cameron Sprang  Exercises Heel Toe Raises with Counter Support - 1 x daily - 7 x weekly - 1 sets - 10 reps Standing Hip Abduction with Counter Support - 1 x daily - 7 x weekly - 1 sets - 10 reps Standing Hip Extension with Counter Support - 1 x daily - 7 x weekly - 1 sets - 10 reps Standing Knee Flexion with Counter Support - 1 x daily - 7 x weekly - 3 sets - 10 reps Sit to Stand without Arm Support - 1 x daily - 7 x weekly - 1 sets - 10 reps         PT Short Term Goals - 09/23/20 1404      PT SHORT TERM GOAL #1   Title Patient will be independent with initial HEP for strengtening/balance with caregiver assistance (ALL STGs Due after Initial 3 Visit)    Baseline initiated HEP today    Time 3    Period --   visits   Status Partially Met      PT SHORT TERM GOAL #2   Title Patient will improve TUG to <20 seconds with LRAD to demonstrate reduced fall risk    Baseline 20.91 secs with min/guard on 09/23/20    Time 3    Period --   visits   Status Achieved      PT SHORT TERM GOAL #3   Title Patient will demo ability to ambulate > 300 ft with CGA with LRAD to demonstrate improved household mobility    Baseline Pt able to ambulate x 150' with CGA on 09/23/20    Time 3    Period --   visits   Status Not Met      PT SHORT TERM GOAL #4   Title Patient will improve 5x sit <> stand to </= 20 secs to demonstrate improved functional strength    Baseline 14.69 secs with BUE support on 09/23/20    Time 3    Period --   visits   Status Achieved             PT Long Term Goals - 09/23/20 1605      PT LONG TERM GOAL #1   Title Patient will be independent with final HEP for balance/strength with caregiver assistance (All LTGs due after 16th Visit)    Baseline no HEP established    Time 8    Period Weeks    Status New      PT LONG TERM GOAL #2   Title Patient will demo ability to  complete 5x sit <> stand in </= 12 seconds to demonstrate improved functional  strength    Baseline 23.09    Time 8    Period Weeks    Status New      PT LONG TERM GOAL #3   Title Patient will demo ability to ascend/descend 4 stairs, alternating pattern, with single rail on L, supervision for improved entrance/exit of home environment    Baseline CGA step to pattern, bilateral rails    Time 8    Period Weeks    Status New      PT LONG TERM GOAL #4   Title Patient will improve TUG to </= 15 seconds w/ LRAD to demo reduced fall risk    Baseline 27.25 secs    Time 8    Period Weeks    Status New      PT LONG TERM GOAL #5   Title Patient will improve gait speed to >/= 1.5 ft/sec to demo improved household mobility    Baseline 0.82 ft/sec    Time 8    Period Weeks    Status New      PT LONG TERM GOAL #6   Title Patient will demo ability to ambulate on indoor/outdoor surfaces >/=  500 ft with LRAD and supervision    Baseline 115 ft CGA    Time 8    Period Weeks    Status New                 Plan - 09/23/20 1554    Clinical Impression Statement Pt has missed several visits, therefore checked STGs to re-establish a baseline.  He has met 2/4 STGs, making progress with 5TSS and TUG however does still need CGA with gait and has not been present to establish HEP.  Was able to initiate HEP today for BLE strengthening and discussed with pts sister on how to cue and assist as needed.  PT to send note to Frann Rider for Wednesday for an order for RW and SLP evaluation.  Also provided pt (front office) and sister with info on how to transition Medicaid to in network to allow coverage.    Personal Factors and Comorbidities Comorbidity 2    Comorbidities HTN, prior stroke (residual right-sided weakness and dysarthria)    Examination-Activity Limitations Stand;Stairs;Locomotion Restaurant manager, fast food Activity;Yard Work    [employer]chant [employer] officer Evolving/Moderate complexity    Rehab  Potential Fair    PT Frequency 2x / week    PT Duration 8 weeks   followed by 1x/week for 4 weeks   PT Treatment/Interventions ADLs/Self Care Home Management;Electrical Stimulation;DME Instruction;Gait training;Stair training;Functional mobility training;Therapeutic activities;Therapeutic exercise;Balance training;Neuromuscular re-education;Patient/family education;Orthotic Fit/Training;Manual techniques;Passive range of motion    PT Next Visit Plan Re-do BERG-update goal if needed, get a new gait speed with RW, give order for RW its in, have him schedule SLP if that order is in as well, Contiue with balance (add to HEP as able), gait with RW (progress as able)    PT Home Exercise Plan pt is aphasic (can follow commands well, responds to yes/no)    Consulted and Agree with Plan of Care Patient;Family member/caregiver    Family Member Consulted Sister (Clara)           Patient will benefit from skilled therapeutic intervention in order to improve the following deficits and impairments:  Abnormal gait, Decreased balance, Decreased endurance, Decreased mobility, Difficulty walking, Pain, Decreased strength, Decreased safety awareness,  Decreased knowledge of use of DME, Decreased activity tolerance, Decreased knowledge of precautions, Decreased range of motion  Visit Diagnosis: Other abnormalities of gait and mobility  Difficulty in walking, not elsewhere classified  Muscle weakness (generalized)  Unsteadiness on feet  Hemiplegia and hemiparesis following cerebral infarction affecting right dominant side [facility])     Problem List Patient Active Problem List   Diagnosis Date Noted  . Alcohol use disorder, severe, in sustained remission, dependence ([place]) 09/05/2020  . Dysphagia 08/21/2020  . AKI (acute kidney injury) ([place]) 08/07/2020  . Hypokalemia 08/07/2020  . Drooling 08/07/2020  . Acute CVA (cerebrovascular accident) ([place]) 08/04/2020  . HTN (hypertension) 08/04/2020  . B12  deficiency 08/04/2020  . Protein-calorie malnutrition, severe ([place]) 08/04/2020    Cameron Sprang, PT, MPT [facility] [address] [place], [place], [address] Phone: [phone]   Fax:  [phone] 09/23/20, 4:10 PM  Name: Adam [name] MRN: [id number] Date of Birth: [birth year]

## 2020-09-23 NOTE — Patient Instructions (Signed)
Access Code: [email or web address] URL: [email or web address] Date: 09/23/2020 Prepared by: Harriet Butte  Exercises Heel Toe Raises with Counter Support - 1 x daily - 7 x weekly - 1 sets - 10 reps Standing Hip Abduction with Counter Support - 1 x daily - 7 x weekly - 1 sets - 10 reps Standing Hip Extension with Counter Support - 1 x daily - 7 x weekly - 1 sets - 10 reps Standing Knee Flexion with Counter Support - 1 x daily - 7 x weekly - 3 sets - 10 reps Sit to Stand without Arm Support - 1 x daily - 7 x weekly - 1 sets - 10 reps

## 2020-09-25 ENCOUNTER — Ambulatory Visit: Payer: Medicaid Other | Admitting: Adult Health

## 2020-09-25 ENCOUNTER — Encounter: Payer: Self-pay | Admitting: Adult Health

## 2020-09-25 VITALS — BP 112/80 | HR 79 | Ht 72.0 in | Wt 123.0 lb

## 2020-09-25 DIAGNOSIS — E785 Hyperlipidemia, unspecified: Secondary | ICD-10-CM

## 2020-09-25 DIAGNOSIS — I69391 Dysphagia following cerebral infarction: Secondary | ICD-10-CM

## 2020-09-25 DIAGNOSIS — I69320 Aphasia following cerebral infarction: Secondary | ICD-10-CM

## 2020-09-25 DIAGNOSIS — E538 Deficiency of other specified B group vitamins: Secondary | ICD-10-CM

## 2020-09-25 DIAGNOSIS — I69322 Dysarthria following cerebral infarction: Secondary | ICD-10-CM

## 2020-09-25 DIAGNOSIS — I10 Essential (primary) hypertension: Secondary | ICD-10-CM

## 2020-09-25 DIAGNOSIS — I63511 Cerebral infarction due to unspecified occlusion or stenosis of right middle cerebral artery: Secondary | ICD-10-CM

## 2020-09-25 NOTE — Patient Instructions (Signed)
Continue working with therapies - order placed to start speech therapy  Continue aspirin 81 mg daily  and atorvastatin  for secondary stroke prevention  Continue to follow up with PCP regarding cholesterol and blood pressure management  Maintain strict control of hypertension with blood pressure goal below 130/90 and cholesterol with LDL cholesterol (bad cholesterol) goal below 70 mg/dL.      Followup in the future with me in 3 months or call earlier if needed     Thank you for coming to see [place] at [facility]. I hope we have been able to provide you high quality care today.  You may receive a patient satisfaction survey over the next few weeks. We would appreciate your feedback and comments so that we may continue to improve ourselves and the health of our patients.

## 2020-09-25 NOTE — Progress Notes (Signed)
[facility] [address] [place]. [place] [address] [phone]       HOSPITAL FOLLOW UP NOTE  [name] Date of Birth:  1961-10-20 Medical Record Number:  [id number]   Reason for Referral:  hospital stroke follow up    SUBJECTIVE:   CHIEF COMPLAINT:  Chief Complaint  Patient presents with  . Hospitalization Follow-up    rm 9, with sister, difficulty walking and speaking    HPI:   [name] is a 59 y.o. male with history of hypertension, prior stroke (residual right-sided weakness and dysarthria), and active smoking (reports 1 cigarette/day) who presented on 08/03/2020 with acute onset drooling, patchy left arm and torso sensory changes, headache, cough, bifacial weakness, significantly worsened speech and gait with recent weight loss.   Personally reviewed pertinent hospitalization progress notes, lab work and imaging with summary provided.  Evaluated by Dr. Pearlean Brownie with stroke work-up revealing acute right MCA patchy infarcts, secondary to embolic source vs large vessel disease.  CTA head/neck showed right M1/M2, left A2/ACA, left P2 and right V4 stenosis.  Initiated DAPT for 3 weeks then aspirin alone.  Hx of HTN not previously on antihypertensives and initiated amlodipine, lisinopril and hydrochlorothiazide with long-term BP goal normotensive range.  LDL 80 and initiate atorvastatin 80 mg daily.  B12 deficiency with supplementation.  Current tobacco use with smoking cessation counseling provided.  Other stroke risk factors include prior strokes on imaging, prior EtOH use and THC use (+UDS).  Residual deficits of hypophonia and moderate dysarthria, dysphagia s/p PEG and R>L lower facial weakness.  Stroke: acute right MCA patchy infarcts, embolic vs. Large vessel disease   CT head - age-indeterminate infarct within the right cerebellum. Multiple chronic appearing lacunar infarcts within the bilateral basal ganglia.   MRI head - Patchy small volume  acute right MCA territory infarcts involving the right frontoparietal region as above. multiple remote lacunar infarcts at bilateral basal ganglia and right cerebellum.   CTA Head and neck- right M1/M2, left A2/A3, left P2 and right P4 stenosis.  2D Echo - EF 60 to 65%.    Sars Corona Virus 2 - negative  LDL - 80  HgbA1c - 5.7   UDS - positive for THC  Hypercoagulable labs unremarkable so far, cardiolipin Ab pending  VTE prophylaxis - Lovenox  No antithrombotic prior to admission, was on aspirin 81 mg daily and Plavix 75 DAPT.  Okay to hold off Plavix for 5 days for potential PEG placement.  May consider resume DAPT for total 3 weeks once hemodynamically stable after procedure.  Patient counseled to be compliant with his antithrombotic medications  Ongoing aggressive stroke risk factor management  Therapy recommendations:  HH OT, OP PT, OP SLP, supervision for mobility  Disposition:   Home  Today, 09/25/2020, Adam [name] is being seen for stroke follow-up accompanied by his sister.  Residual deficits of dysphagia with use of PEG tube for all intake (NPO), gait impairment, speech and language difficulty, facial weakness with drooling.  He recently started outpatient PT/OT but has not received any speech therapy as these orders were not placed per sister.  Ambulating with rolling walker and denies any recent falls.  Denies new or worsening stroke/TIA symptoms. Denies new or worsening stroke/TIA symptoms.  Completed 3 weeks DAPT and remains on aspirin alone without bleeding or bruising.  Remains on atorvastatin 80 mg daily without myalgias.  Blood pressure today 112/80.  Sister routinely monitors at home which has been stable.  Reports complete tobacco  and THC cessation since discharge.  No further concerns at this time.     ROS:   N/A d/t language impairment  PMH: No past medical history on file.  PSH:  Past Surgical History:  Procedure Laterality Date  . BUBBLE STUDY  08/09/2020    Procedure: BUBBLE STUDY;  Surgeon: Pricilla Riffleoss, Paula V, MD;  [facility];  Service: Cardiovascular;;  . IR GASTROSTOMY TUBE MOD SED  08/16/2020  . TEE WITHOUT CARDIOVERSION N/A 08/09/2020   Procedure: TRANSESOPHAGEAL ECHOCARDIOGRAM (TEE);  Surgeon: Pricilla Riffleoss, Paula V, MD;  [facility];  Service: Cardiovascular;  Laterality: N/A;    Social History:  Social History   Socioeconomic History  . Marital status: Single    Spouse name: Not on file  . Number of children: 5  . Years of education: Not on file  . Highest education level: Not on file  Occupational History  . Not on file  Tobacco Use  . Smoking status: Current Every Day Smoker    Types: Cigarettes  . Smokeless tobacco: Never Used  Vaping Use  . Vaping Use: Never used  Substance and Sexual Activity  . Alcohol use: Not Currently    Alcohol/week: 1.0 standard drink    Types: 1 Cans of beer per week  . Drug use: Not Currently    Types: Marijuana    Comment: cigarette 1 pck/week  . Sexual activity: Yes    Partners: Female    Birth control/protection: Condom  Other Topics Concern  . Not on file  Social History Narrative  . Not on file   Social Determinants of Health   Financial Resource Strain:   . Difficulty of Paying Living Expenses: Not on file  Food Insecurity:   . Worried About Programme researcher, broadcasting/film/videounning Out of Food in the Last Year: Not on file  . Ran Out of Food in the Last Year: Not on file  Transportation Needs:   . Lack of Transportation (Medical): Not on file  . Lack of Transportation (Non-Medical): Not on file  Physical Activity:   . Days of Exercise per Week: Not on file  . Minutes of Exercise per Session: Not on file  Stress:   . Feeling of Stress : Not on file  Social Connections:   . Frequency of Communication with Friends and Family: Not on file  . Frequency of Social Gatherings with Friends and Family: Not on file  . Attends Religious Services: Not on file  . Active Member of Clubs or Organizations:  Not on file  . Attends BankerClub or Organization Meetings: Not on file  . Marital Status: Not on file  Intimate Partner Violence:   . Fear of Current or Ex-Partner: Not on file  . Emotionally Abused: Not on file  . Physically Abused: Not on file  . Sexually Abused: Not on file    Family History:  Family History  Problem Relation Age of Onset  . Cancer Mother   . Other Brother        shot    Medications:   Current Outpatient Medications on File Prior to Visit  Medication Sig Dispense Refill  . aspirin 81 MG chewable tablet Place 1 tablet (81 mg total) into feeding tube daily. 60 tablet 0  . carvedilol (COREG) 12.5 MG tablet Place 1 tablet (12.5 mg total) into feeding tube 2 (two) times daily with a meal. 120 tablet 0  . Nutritional Supplements (FEEDING SUPPLEMENT, OSMOLITE 1.5 CAL,) LIQD Place 1,000 mLs into feeding tube continuous. Osmolite 1.2 cal @  41ml/hr, 75ml Prosource TF dialy 1000 mL 10  . senna-docusate (SENOKOT-S) 8.6-50 MG tablet Take 1 tablet by mouth at bedtime. 60 tablet 0  . thiamine 100 MG tablet Place 1 tablet (100 mg total) into feeding tube daily. 60 tablet 0  . vitamin B-12 1000 MCG tablet Place 1 tablet (1,000 mcg total) into feeding tube daily. 60 tablet 0  . atorvastatin (LIPITOR) 80 MG tablet Place 1 tablet (80 mg total) into feeding tube daily. 60 tablet 0   No current facility-administered medications on file prior to visit.    Allergies:  No Known Allergies    OBJECTIVE:  Physical Exam  Vitals:   09/25/20 1402  BP: 112/80  Pulse: 79  Weight: 123 lb (55.8 kg)  Height: 6' (1.829 m)   Body mass index is 16.68 kg/m. No exam data present  General: Frail pleasant middle-aged African-American male, seated, in no evident distress Head: head normocephalic and atraumatic.   Neck: supple with no carotid or supraclavicular bruits Cardiovascular: regular rate and rhythm, no murmurs Musculoskeletal: no deformity Skin:  no rash/petichiae Vascular:   Normal pulses all extremities   Neurologic Exam Mental Status: Awake and fully alert.   Hypophonia with moderate dysarthria and aphasia.  Able to state name and month and year otherwise will shake head for yes/no questions.  Oriented to place and time.  Difficulty fully assessing cognition due to speech and language impairment.  Mood and affect appropriate and cooperative with exam.  Cranial Nerves: Fundoscopic exam reveals sharp disc margins. Pupils equal, briskly reactive to light. Extraocular movements full without nystagmus. Visual fields full to confrontation. Hearing intact. Facial sensation intact. L>R lower facial weakness.  Tongue and palate moves normally and symmetrically.  Motor: Normal bulk and tone. Normal strength in all tested extremity muscles. Sensory.:  Reports decreased light touch sensation left upper and lower extremity Coordination: Rapid alternating movements normal in all extremities. Finger-to-nose and heel-to-shin performed accurately bilaterally. Gait and Station: Deferred as rolling walker not present during visit Reflexes: 1+ and symmetric. Toes downgoing.     NIHSS  7 Modified Rankin  3-4      ASSESSMENT: Adam [name] is a 59 y.o. year old male presented with acute onset drooling, patchy left arm and torso sensory changes, headache, cough, bifacial weakness, significantly worsening speech and gait on 08/03/2020 with stroke work-up revealing acute right MCA patchy infarcts, embolic vs large vessel disease source.  Vascular risk factors include HTN, HLD, prior stroke with residual right-sided weakness and dysarthria, current tobacco use, intracranial stenosis THC use, B12 deficiency and prior EtOH use.      PLAN:  1. R MCA stroke :  a. Residual deficit: Dysphagia s/p PEG (NPO), dysarthria, expressive aphasia, left hemisensory impairment, gait impairment and L>R facial weakness.  Referral placed to neuro rehab SLP and advised continued participation with neuro  rehab PT/OT  b. continue aspirin 81 mg daily  and atorvastatin 80 mg daily for secondary stroke prevention.  c. Discussed secondary stroke prevention measures and importance of close PCP follow up for aggressive stroke risk factor management  2. HTN: BP goal <130/90.  Stable today. Continue f/u with PCP 3. HLD: LDL goal <70. Recent LDL 88.  Initiate atorvastatin 80 mg daily during recent admission.  Advised to follow-up with PCP for repeat lipid panel in the next 1 to 2 months. 4. B12 deficiency: Continue supplementation and follow-up with PCP for routine medical management 5. Tobacco and THC use: Congratulated on complete cessation and highly encouraged  continued avoidance    Follow up in 3 months or call earlier if needed   CC:  [facility] provider: Dr. Fuller Song, [name], MD    I spent 45 minutes of face-to-face and non-face-to-face time with patient and sister.  This included previsit chart review and recent hospitalization pertinent progress notes, lab work and imaging, lab review, study review, order entry, electronic health record documentation, patient education and discussion regarding recent stroke, residual deficits, importance of managing stroke risk factors and answered all questions to patient satisfaction   Ihor Austin, AGNP-BC  [facility] [address] [place], [place] [address]  Phone [phone] Fax [phone] Note: This document was prepared with digital dictation and possible smart phrase technology. Any transcriptional errors that result from this process are unintentional.

## 2020-09-26 ENCOUNTER — Encounter: Payer: Self-pay | Admitting: Adult Health

## 2020-09-26 NOTE — Progress Notes (Signed)
I agree with the above plan 

## 2020-09-30 ENCOUNTER — Other Ambulatory Visit: Payer: Self-pay

## 2020-09-30 ENCOUNTER — Ambulatory Visit: Payer: Medicaid Other

## 2020-09-30 DIAGNOSIS — R2681 Unsteadiness on feet: Secondary | ICD-10-CM

## 2020-09-30 DIAGNOSIS — R262 Difficulty in walking, not elsewhere classified: Secondary | ICD-10-CM

## 2020-09-30 DIAGNOSIS — R2689 Other abnormalities of gait and mobility: Secondary | ICD-10-CM | POA: Diagnosis not present

## 2020-09-30 DIAGNOSIS — M6281 Muscle weakness (generalized): Secondary | ICD-10-CM

## 2020-09-30 DIAGNOSIS — I69351 Hemiplegia and hemiparesis following cerebral infarction affecting right dominant side: Secondary | ICD-10-CM

## 2020-09-30 NOTE — Therapy (Signed)
[facility] [address] [place], [place], [address] Phone: [phone]   Fax:  [phone]  Physical Therapy Treatment  Patient Details  Name: Adam [name] MRN: [id number] Date of Birth: 02/05/1961 Referring Provider (PT): Roberto Scales, MD. Followed by Ihor Austin, NP   Encounter Date: 09/30/2020   PT End of Session - 09/30/20 1354    Visit Number 3    Number of Visits 16   updated POC   Date for PT Re-Evaluation 12/04/20   POC for 7 weeks, Cert for 90 days   Authorization Type Self Pay Until MCD Switched. Currently [facility] MCD    Equipment Utilized During Treatment Gait belt    Activity Tolerance Patient limited by fatigue;No increased pain    Behavior During Therapy Flat affect;Impulsive           No past medical history on file.  Past Surgical History:  Procedure Laterality Date  . BUBBLE STUDY  08/09/2020   Procedure: BUBBLE STUDY;  Surgeon: Pricilla Riffle, MD;  Location: [facility] ENDOSCOPY;  Service: Cardiovascular;;  . IR GASTROSTOMY TUBE MOD SED  08/16/2020  . TEE WITHOUT CARDIOVERSION N/A 08/09/2020   Procedure: TRANSESOPHAGEAL ECHOCARDIOGRAM (TEE);  Surgeon: Pricilla Riffle, MD;  Location: [facility] ENDOSCOPY;  Service: Cardiovascular;  Laterality: N/A;    There were no vitals filed for this visit.   Subjective Assessment - 09/30/20 1322    Subjective Pt shook "No" for pain today in his back and also shook head "no" for any falls.    Pertinent History HTN, prior stroke (residual right-sided weakness and dysarthria)    Limitations Standing;Walking;House hold activities    Patient Stated Goals Get Stronger    Currently in Pain? No/denies    Pain Onset In the past 7 days              [facility] PT Assessment - 09/30/20 1317      Standardized Balance Assessment   Standardized Balance Assessment Berg Balance Test;10 meter walk test    10 Meter Walk 0.56 m/s with RW      Berg Balance Test   Sit  to Stand Able to stand  independently using hands    Standing Unsupported Able to stand safely 2 minutes    Sitting with Back Unsupported but Feet Supported on Floor or Stool Able to sit safely and securely 2 minutes    Stand to Sit Sits safely with minimal use of hands    Transfers Able to transfer safely, minor use of hands    Standing Unsupported with Eyes Closed Able to stand 10 seconds with supervision    Standing Unsupported with Feet Together Able to place feet together independently and stand for 1 minute with supervision    From Standing, Reach Forward with Outstretched Arm Can reach confidently >25 cm (10")    From Standing Position, Pick up Object from Floor Able to pick up shoe safely and easily    From Standing Position, Turn to Look Behind Over each Shoulder Looks behind from both sides and weight shifts well    Turn 360 Degrees Able to turn 360 degrees safely but slowly    Standing Unsupported, Alternately Place Feet on Step/Stool Able to complete >2 steps/needs minimal assist    Standing Unsupported, One Foot in Front Able to take small step independently and hold 30 seconds   More difficlty with R leg behind compared to L leg behind   Standing on One Leg Able to  lift leg independently and hold equal to or more than 3 seconds   R leg 3 sec; L leg 30 sec   Total Score 44                 Berg balance scale and 10 meter per second tests performed Gait training: 1 x 414' with RW, cues to stand up tall, improve L foot clerance, not to lean on the walker with UE excessively, looking down when turning to improve foot clearance and avoiding hitting leg of walker with L LE 1 x 115' with RW and swedish knee cage: 70-80% improvement in L foot lcearance noted with swedish knee cage on R Knee.                   PT Short Term Goals - 09/30/20 1328      PT SHORT TERM GOAL #1   Title Patient will be independent with initial HEP for strengtening/balance with caregiver  assistance (ALL STGs Due after Initial 3 Visit)             PT Long Term Goals - 09/23/20 1605      PT LONG TERM GOAL #1   Title Patient will be independent with final HEP for balance/strength with caregiver assistance (All LTGs due after 16th Visit)    Baseline no HEP established    Time 8    Period Weeks    Status New      PT LONG TERM GOAL #2   Title Patient will demo ability to complete 5x sit <> stand in </= 12 seconds to demonstrate improved functional strength    Baseline 23.09    Time 8    Period Weeks    Status New      PT LONG TERM GOAL #3   Title Patient will demo ability to ascend/descend 4 stairs, alternating pattern, with single rail on L, supervision for improved entrance/exit of home environment    Baseline CGA step to pattern, bilateral rails    Time 8    Period Weeks    Status New      PT LONG TERM GOAL #4   Title Patient will improve TUG to </= 15 seconds w/ LRAD to demo reduced fall risk    Baseline 27.25 secs    Time 8    Period Weeks    Status New      PT LONG TERM GOAL #5   Title Patient will improve gait speed to >/= 1.5 ft/sec to demo improved household mobility    Baseline 0.82 ft/sec    Time 8    Period Weeks    Status New      PT LONG TERM GOAL #6   Title Patient will demo ability to ambulate on indoor/outdoor surfaces >/=  500 ft with LRAD and supervision    Baseline 115 ft CGA    Time 8    Period Weeks    Status New                 Plan - 09/30/20 1351    Clinical Impression Statement Berg balance scale assessed today. scored 44/56 with higher risk for fall. Main impairments on Berg Balance scale were ability to perform sit to stand without HHA, Tandem stance with R LE behind, SLS on R LE, turning 360 deg. Patient demo 0.56 m/s gait speed with RW which puts him in household ampulatory category. Patient demonstrated improved foot cearance on L LE with [place]  knee cage on R knee to prevent hyperextension in R knee.    Personal  Factors and Comorbidities Comorbidity 2    Comorbidities HTN, prior stroke (residual right-sided weakness and dysarthria)    Examination-Activity Limitations Stand;Stairs;Locomotion Systems developer Activity;Yard Work    Conservation officer, historic buildings Evolving/Moderate complexity    Rehab Potential Fair    PT Frequency 2x / week    PT Duration 8 weeks   followed by 1x/week for 4 weeks   PT Treatment/Interventions ADLs/Self Care Home Management;Electrical Stimulation;DME Instruction;Gait training;Stair training;Functional mobility training;Therapeutic activities;Therapeutic exercise;Balance training;Neuromuscular re-education;Patient/family education;Orthotic Fit/Training;Manual techniques;Passive range of motion    PT Next Visit Plan Try Swedish knee cage on R LE again to see if it consistently improves L foot clerance, work on turning and preventing L foot from hitting the walker (pt given cues to look down at his feet when turning to avoid hitting his leg and staying inside his walker), work on [employer].    PT Home Exercise Plan pt is aphasic (can follow commands well, responds to yes/no)    Consulted and Agree with Plan of Care Patient;Family member/caregiver    Family Member Consulted Sister (Clara)           Patient will benefit from skilled therapeutic intervention in order to improve the following deficits and impairments:  Abnormal gait, Decreased balance, Decreased endurance, Decreased mobility, Difficulty walking, Pain, Decreased strength, Decreased safety awareness, Decreased knowledge of use of DME, Decreased activity tolerance, Decreased knowledge of precautions, Decreased range of motion  Visit Diagnosis: Muscle weakness (generalized)  Unsteadiness on feet  Other abnormalities of gait and mobility  Difficulty in walking, not elsewhere classified  Hemiplegia and hemiparesis following cerebral  infarction affecting right dominant side [facility])     Problem List Patient Active Problem List   Diagnosis Date Noted  . Alcohol use disorder, severe, in sustained remission, dependence (HCC) 09/05/2020  . Dysphagia 08/21/2020  . AKI (acute kidney injury) (HCC) 08/07/2020  . Hypokalemia 08/07/2020  . Drooling 08/07/2020  . Acute CVA (cerebrovascular accident) (HCC) 08/04/2020  . HTN (hypertension) 08/04/2020  . B12 deficiency 08/04/2020  . Protein-calorie malnutrition, severe (HCC) 08/04/2020    Ileana Ladd, PT 09/30/2020, 2:01 PM  [facility] [address] [place], [place], [address] Phone: [phone]   Fax:  [phone]  Name: Adam [name] MRN: [id number] Date of Birth: 1961/11/08

## 2020-10-02 ENCOUNTER — Ambulatory Visit: Payer: Medicaid Other | Admitting: Occupational Therapy

## 2020-10-04 ENCOUNTER — Ambulatory Visit: Payer: Medicaid Other | Admitting: Physical Therapy

## 2020-10-09 ENCOUNTER — Ambulatory Visit: Payer: Medicaid Other | Admitting: Occupational Therapy

## 2020-10-09 ENCOUNTER — Ambulatory Visit: Payer: Medicaid Other | Admitting: Physical Therapy

## 2020-10-09 ENCOUNTER — Other Ambulatory Visit: Payer: Self-pay

## 2020-10-09 ENCOUNTER — Encounter: Payer: Self-pay | Admitting: Physical Therapy

## 2020-10-09 DIAGNOSIS — R262 Difficulty in walking, not elsewhere classified: Secondary | ICD-10-CM

## 2020-10-09 DIAGNOSIS — R2681 Unsteadiness on feet: Secondary | ICD-10-CM

## 2020-10-09 DIAGNOSIS — I69351 Hemiplegia and hemiparesis following cerebral infarction affecting right dominant side: Secondary | ICD-10-CM

## 2020-10-09 DIAGNOSIS — R2689 Other abnormalities of gait and mobility: Secondary | ICD-10-CM

## 2020-10-09 DIAGNOSIS — R278 Other lack of coordination: Secondary | ICD-10-CM

## 2020-10-09 DIAGNOSIS — M6281 Muscle weakness (generalized): Secondary | ICD-10-CM

## 2020-10-09 NOTE — Therapy (Signed)
[facility] [address] [place], [place], [address] Phone: [phone]   Fax:  [phone]  Occupational Therapy Treatment  Patient Details  Name: Adam [name] MRN: [id number] Date of Birth: 10-31-1961 No data recorded  Encounter Date: 10/09/2020   OT End of Session - 10/09/20 1620    Visit Number 2    Number of Visits 9    Date for OT Re-Evaluation 10/23/20    Authorization Type MCD - Out of network    OT Start Time 1617    OT Stop Time 1655    OT Time Calculation (min) 38 min    Activity Tolerance Patient tolerated treatment well    Behavior During Therapy Flat affect           No past medical history on file.  Past Surgical History:  Procedure Laterality Date  . BUBBLE STUDY  08/09/2020   Procedure: BUBBLE STUDY;  Surgeon: Pricilla Riffle, MD;  Location: [facility] ENDOSCOPY;  Service: Cardiovascular;;  . IR GASTROSTOMY TUBE MOD SED  08/16/2020  . TEE WITHOUT CARDIOVERSION N/A 08/09/2020   Procedure: TRANSESOPHAGEAL ECHOCARDIOGRAM (TEE);  Surgeon: Pricilla Riffle, MD;  Location: [facility] ENDOSCOPY;  Service: Cardiovascular;  Laterality: N/A;    There were no vitals filed for this visit.   Subjective Assessment - 10/09/20 1620    Subjective  Pt denies pain. Pt consistent with yes/no questions    Pertinent History Acute Rt CVA w/ Lt hemiparesis on 08/03/20. 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   Medium Pegs and copying pattern with LUE. Pt with apraxia noted with using hand sanitizer and with correct use of pegs and how to place in board. Increased time required and moderate drops throughout activity. Pt with difficulty following pattern completely and accurately. Completed with approximately 4 errors. Pt was  able to identify errors and correct them when his attention was drawn to them.   Practiced tub bench transfers with supervision this day.                   OT Education - 10/09/20 1635    Education Details Education provided on tub transfer bench and use.    Person(s) Educated Patient    Methods Explanation;Demonstration    Comprehension Verbalized understanding               OT Long Term Goals - 09/23/20 1400      OT LONG TERM GOAL #1   Title Independent with coordination HEP and LUE strengthening HEP (low range theraband)    Baseline dependent    Time 4    Period Weeks    Status New      OT LONG TERM GOAL #2   Title Improve coordination Lt hand as evidenced by performing 9 hole peg test in 38 sec or less    Baseline 45.94 sec    Time 4    Period Weeks    Status New      OT LONG TERM GOAL #3   Title Pt to perform light household tasks (folding towels, making bed) at mod I level safely    Baseline dependent    Time 4    Period Weeks    Status New  OT LONG TERM GOAL #4   Title Pt to verbalize understanding with any AE and DME recommendations for greater ease/safety    Baseline dependent    Time 4    Period Weeks    Status New                 Plan - 10/09/20 1700    Clinical Impression Statement Pt progressing towards goals. Pt demonstrating some apraxia and decreased coordination with LUE.    OT Occupational Profile and History Detailed Assessment- Review of Records and additional review of physical, cognitive, psychosocial history related to current functional performance    Occupational performance deficits (Please refer to evaluation for details): ADL's;IADL's    Body Structure / Function / Physical Skills IADL;ADL;UE functional use;Coordination;Balance;Mobility;FMC    Rehab Potential Good    Clinical Decision Making Several treatment options, min-mod task modification necessary    Comorbidities Affecting Occupational Performance:  May have comorbidities impacting occupational performance    Modification or Assistance to Complete Evaluation  Min-Moderate modification of tasks or assist with assess necessary to complete eval    OT Frequency 2x / week    OT Duration 4 weeks   PLUS eval   OT Treatment/Interventions Self-care/ADL training;DME and/or AE instruction;Therapeutic activities;Therapeutic exercise;Coping strategies training;Neuromuscular education;Functional Mobility Training;Passive range of motion;Manual Therapy;Patient/family education    Plan coordination HEP, check order for tub bench    Recommended Other Services speech therapy (referral sent but was not scheduled)    Consulted and Agree with Plan of Care Patient           Patient will benefit from skilled therapeutic intervention in order to improve the following deficits and impairments:   Body Structure / Function / Physical Skills: IADL, ADL, UE functional use, Coordination, Balance, Mobility, FMC       Visit Diagnosis: Muscle weakness (generalized)  Other lack of coordination  Hemiplegia and hemiparesis following cerebral infarction affecting right dominant side (HCC)  Unsteadiness on feet    Problem List Patient Active Problem List   Diagnosis Date Noted  . Alcohol use disorder, severe, in sustained remission, dependence (HCC) 09/05/2020  . Dysphagia 08/21/2020  . AKI (acute kidney injury) (HCC) 08/07/2020  . Hypokalemia 08/07/2020  . Drooling 08/07/2020  . Acute CVA (cerebrovascular accident) (HCC) 08/04/2020  . HTN (hypertension) 08/04/2020  . B12 deficiency 08/04/2020  . Protein-calorie malnutrition, severe (HCC) 08/04/2020    Junious Dresser MOT, OTR/L  10/09/2020, 5:02 PM   [facility] [address] [place], [place], [address] Phone: [phone]   Fax:  [phone]  Name: Adam [name] MRN: [id number] Date of Birth: 08-04-1961

## 2020-10-10 ENCOUNTER — Telehealth: Payer: Self-pay | Admitting: *Deleted

## 2020-10-10 DIAGNOSIS — I63511 Cerebral infarction due to unspecified occlusion or stenosis of right middle cerebral artery: Secondary | ICD-10-CM

## 2020-10-10 NOTE — Telephone Encounter (Signed)
Spoke to [place], NP OT recommend tub transfer bench.  Order placed.

## 2020-10-10 NOTE — Telephone Encounter (Signed)
LMVM for pt that I have DME order for tub transfer bench.  Mail, fax, pick up?

## 2020-10-10 NOTE — Telephone Encounter (Signed)
-----   Message from Ihor Austin, NP sent at 10/09/2020  5:06 PM EST ----- Can you please assist with this?  Thank you.

## 2020-10-11 ENCOUNTER — Ambulatory Visit: Payer: Medicaid Other | Admitting: Physical Therapy

## 2020-10-11 ENCOUNTER — Encounter: Payer: Self-pay | Admitting: Physical Therapy

## 2020-10-11 ENCOUNTER — Other Ambulatory Visit: Payer: Self-pay

## 2020-10-11 ENCOUNTER — Ambulatory Visit: Payer: Medicaid Other | Admitting: Occupational Therapy

## 2020-10-11 ENCOUNTER — Encounter: Payer: Self-pay | Admitting: Occupational Therapy

## 2020-10-11 DIAGNOSIS — R2689 Other abnormalities of gait and mobility: Secondary | ICD-10-CM | POA: Diagnosis not present

## 2020-10-11 DIAGNOSIS — M6281 Muscle weakness (generalized): Secondary | ICD-10-CM

## 2020-10-11 DIAGNOSIS — R2681 Unsteadiness on feet: Secondary | ICD-10-CM

## 2020-10-11 DIAGNOSIS — R262 Difficulty in walking, not elsewhere classified: Secondary | ICD-10-CM

## 2020-10-11 DIAGNOSIS — I69351 Hemiplegia and hemiparesis following cerebral infarction affecting right dominant side: Secondary | ICD-10-CM

## 2020-10-11 DIAGNOSIS — R278 Other lack of coordination: Secondary | ICD-10-CM

## 2020-10-11 NOTE — Therapy (Signed)
[facility] [address] [place], [place], [address] Phone: [phone]   Fax:  [phone]  Physical Therapy Treatment  Patient Details  Name: Adam [name] MRN: [id number] Date of Birth: [birth year] Referring Provider (PT): Roberto Scales, MD. Followed by Ihor Austin, NP   Encounter Date: 10/09/2020     10/09/20 1538  PT Visits / Re-Eval  Visit Number 4  Number of Visits 16 (updated POC)  Date for PT Re-Evaluation 12/04/20 (POC for 7 weeks, Cert for 90 days)  Authorization  Authorization Type Self Pay Until MCD Switched. Currently [place] [facility] MCD  PT Time Calculation  PT Start Time 1534  PT Stop Time 1612  PT Time Calculation (min) 38 min  PT - End of Session  Equipment Utilized During Treatment Gait belt  Activity Tolerance Patient tolerated treatment well;Patient limited by fatigue;No increased pain  Behavior During Therapy Flat affect;Impulsive;WFL for tasks assessed/performed    History reviewed. No pertinent past medical history.  Past Surgical History:  Procedure Laterality Date  . BUBBLE STUDY  08/09/2020   Procedure: BUBBLE STUDY;  Surgeon: Pricilla Riffle, MD;  Location: [facility] ENDOSCOPY;  Service: Cardiovascular;;  . IR GASTROSTOMY TUBE MOD SED  08/16/2020  . TEE WITHOUT CARDIOVERSION N/A 08/09/2020   Procedure: TRANSESOPHAGEAL ECHOCARDIOGRAM (TEE);  Surgeon: Pricilla Riffle, MD;  Location: [facility] ENDOSCOPY;  Service: Cardiovascular;  Laterality: N/A;    There were no vitals filed for this visit.     10/09/20 1537  Symptoms/Limitations  Subjective Pt nodding "no" to pain and falls. Reports doing HEP with no issues.  Pertinent History HTN, prior stroke (residual right-sided weakness and dysarthria)  Limitations Standing;Walking;House hold activities  Patient Stated Goals Get Stronger  Pain Assessment  Currently in Pain? No/denies  Pain Score 0      10/09/20 1540  Transfers  Transfers  Sit to Stand;Stand to Sit  Sit to Stand 5: Supervision;With upper extremity assist;From chair/3-in-1;From bed  Sit to Stand Details Verbal cues for sequencing;Verbal cues for technique  Stand to Sit 5: Supervision;With upper extremity assist;To bed;To chair/3-in-1  Stand to Sit Details (indicate cue type and reason) Verbal cues for sequencing;Verbal cues for technique  Ambulation/Gait  Ambulation/Gait Yes  Ambulation/Gait Assistance 4: Min guard;5: Supervision  Ambulation/Gait Assistance Details trialed use of posterior Ottobock brace with gait with improved left foot clearance and knee control. Continues to need cues for increased left step length at times.   Ambulation Distance (Feet) 115 Feet (x2,130 x1 in/outdoors plus around gym with session)  Assistive device Rolling walker  Gait Pattern Step-through pattern;Decreased arm swing - right;Decreased step length - right;Decreased step length - left;Decreased stance time - right;Decreased dorsiflexion - right;Narrow base of support;Poor foot clearance - right  Ambulation Surface Level;Indoor;Unlevel;Outdoor;Paved  Ramp Other (comment) (min guard assist)  Ramp Details (indicate cue type and reason) with RW/left posterior Ottobock on outdoor inclines/declines x 2 reps each with cues for increased left foot clearance.   Neuro Re-ed   Neuro Re-ed Details  for strengthening/muscle re-ed: standing at bottom of steps with bil UE support: right forward step ups lifting left foot into air/back down for 10 reps, then right lateral step ups lifting left foot into air/back down for 10 reps with light UE support on rails, min guard assist; bil stance on floor at bottom of steps- right foot taps up/down bottom 2 steps for 10 reps with emphasis on hip/knee flexion, not sliding foot along. min guard assit for balance with cues on  form/task.          PT Short Term Goals - 09/30/20 1328      PT SHORT TERM GOAL #1   Title Patient will be independent with  initial HEP for strengtening/balance with caregiver assistance (ALL STGs Due after Initial 3 Visit)             PT Long Term Goals - 09/23/20 1605      PT LONG TERM GOAL #1   Title Patient will be independent with final HEP for balance/strength with caregiver assistance (All LTGs due after 16th Visit)    Baseline no HEP established    Time 8    Period Weeks    Status New      PT LONG TERM GOAL #2   Title Patient will demo ability to complete 5x sit <> stand in </= 12 seconds to demonstrate improved functional strength    Baseline 23.09    Time 8    Period Weeks    Status New      PT LONG TERM GOAL #3   Title Patient will demo ability to ascend/descend 4 stairs, alternating pattern, with single rail on L, supervision for improved entrance/exit of home environment    Baseline CGA step to pattern, bilateral rails    Time 8    Period Weeks    Status New      PT LONG TERM GOAL #4   Title Patient will improve TUG to </= 15 seconds w/ LRAD to demo reduced fall risk    Baseline 27.25 secs    Time 8    Period Weeks    Status New      PT LONG TERM GOAL #5   Title Patient will improve gait speed to >/= 1.5 ft/sec to demo improved household mobility    Baseline 0.82 ft/sec    Time 8    Period Weeks    Status New      PT LONG TERM GOAL #6   Title Patient will demo ability to ambulate on indoor/outdoor surfaces >/=  500 ft with LRAD and supervision    Baseline 115 ft CGA    Time 8    Period Weeks    Status New             10/09/20 1539  Plan  Clinical Impression Statement Today's skilled session focused on trial of braces for foot drop adn knee control. Pt responded well with use of Ottobock (posterior) brace on 1st try. Will continue to use this brace to further assess if it works when pt is fatigued. Will plan to ask MD for AFO order as well. The pt is progressing toward goals and should benefit from continued PT to progress toward unmet goals.  Personal Factors and  Comorbidities Comorbidity 2  Comorbidities HTN, prior stroke (residual right-sided weakness and dysarthria)  Examination-Activity Limitations Stand;Stairs;Locomotion Pharmacist, community Activity;Yard Work  Pt will benefit from skilled therapeutic intervention in order to improve on the following deficits Abnormal gait;Decreased balance;Decreased endurance;Decreased mobility;Difficulty walking;Pain;Decreased strength;Decreased safety awareness;Decreased knowledge of use of DME;Decreased activity tolerance;Decreased knowledge of precautions;Decreased range of motion  Stability/Clinical Decision Making Evolving/Moderate complexity  Rehab Potential Fair  PT Frequency 2x / week  PT Duration 8 weeks (followed by 1x/week for 4 weeks)  PT Treatment/Interventions ADLs/Self Care Home Management;Electrical Stimulation;DME Instruction;Gait training;Stair training;Functional mobility training;Therapeutic activities;Therapeutic exercise;Balance training;Neuromuscular re-education;Patient/family education;Orthotic Fit/Training;Manual techniques;Passive range of motion  PT Next Visit Plan continue with trial of posterior  Ottobock brace; work on balance progressing from solid surface to compliant surfaces, dynamic balance as well  PT Home Exercise Plan pt is aphasic (can follow commands well, responds to yes/no)  Consulted and Agree with Plan of Care Patient;Family member/caregiver  Family Member Consulted Sister (Clara)          Patient will benefit from skilled therapeutic intervention in order to improve the following deficits and impairments:  Abnormal gait, Decreased balance, Decreased endurance, Decreased mobility, Difficulty walking, Pain, Decreased strength, Decreased safety awareness, Decreased knowledge of use of DME, Decreased activity tolerance, Decreased knowledge of precautions, Decreased range of motion  Visit  Diagnosis: Muscle weakness (generalized)  Unsteadiness on feet  Other abnormalities of gait and mobility  Difficulty in walking, not elsewhere classified  Hemiplegia and hemiparesis following cerebral infarction affecting right dominant side [facility])     Problem List Patient Active Problem List   Diagnosis Date Noted  . Alcohol use disorder, severe, in sustained remission, dependence (HCC) 09/05/2020  . Dysphagia 08/21/2020  . AKI (acute kidney injury) (HCC) 08/07/2020  . Hypokalemia 08/07/2020  . Drooling 08/07/2020  . Acute CVA (cerebrovascular accident) (HCC) 08/04/2020  . HTN (hypertension) 08/04/2020  . B12 deficiency 08/04/2020  . Protein-calorie malnutrition, severe (HCC) 08/04/2020    Sallyanne Kuster, PTA, [facility] [address] [place], [place] [address] [phone] 10/11/20, 12:16 PM   Name: Adam [name] MRN: [id number] Date of Birth: [birth year]

## 2020-10-11 NOTE — Patient Instructions (Signed)

## 2020-10-11 NOTE — Therapy (Signed)
[facility] [address] [place], [place], [address] Phone: [phone]   Fax:  [phone]  Occupational Therapy Treatment  Patient Details  Name: Adam [name] MRN: [id number] Date of Birth: 02-May-1961 No data recorded  Encounter Date: 10/11/2020   OT End of Session - 10/11/20 1448    Visit Number 3    Number of Visits 9    Date for OT Re-Evaluation 10/23/20    Authorization Type MCD - Out of network    OT Start Time 1448    OT Stop Time 1530    OT Time Calculation (min) 42 min    Activity Tolerance Patient tolerated treatment well    Behavior During Therapy Flat affect           History reviewed. No pertinent past medical history.  Past Surgical History:  Procedure Laterality Date  . BUBBLE STUDY  08/09/2020   Procedure: BUBBLE STUDY;  Surgeon: Pricilla Riffle, MD;  Location: [facility] ENDOSCOPY;  Service: Cardiovascular;;  . IR GASTROSTOMY TUBE MOD SED  08/16/2020  . TEE WITHOUT CARDIOVERSION N/A 08/09/2020   Procedure: TRANSESOPHAGEAL ECHOCARDIOGRAM (TEE);  Surgeon: Pricilla Riffle, MD;  Location: [facility] ENDOSCOPY;  Service: Cardiovascular;  Laterality: N/A;    There were no vitals filed for this visit.   Subjective Assessment - 10/11/20 1448    Subjective  Pt denies pain today.    Pertinent History Acute Rt CVA w/ Lt hemiparesis on 08/03/20. 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 No/denies             Treatment:  Supine Shoulder Exercises 2 lb dumbbell - shoulder flexion, horizontal abduction, chest press x 10  Yellow theraband exercises x 10 with LUE - see pt instructions  Folding laundry - pt reports not assisting with laundry at home. Pt folded basket of laundry (clothes and towels) at edge of mat while seated with increased time. Pt moved to standing  at table for folding laundry at his request. Pt tolerated standing with SBA for laundry. Pt tolerated standing for approximately 8 minutes for folding laundry before requiring seated rest break.    Grooved Peg board - LUE as dominant with RUE supporting and stabilizing at times. No drops. Pt utilized right hand for rotating pegs.    OT walked patient to lobby where he would wait for his sister for pick up. Pt was issued handouts for DME equipment (tub transfer bench) and theraband exercises.                    OT Long Term Goals - 10/11/20 1517      OT LONG TERM GOAL #1   Title Independent with coordination HEP and LUE strengthening HEP (low range theraband)    Baseline dependent    Time 4    Period Weeks    Status On-going   yellow theraband ex 11/19     OT LONG TERM GOAL #2   Title Improve coordination Lt hand as evidenced by performing 9 hole peg test in 38 sec or less    Baseline 45.94 sec    Time 4    Period Weeks    Status On-going      OT LONG TERM GOAL #3   Title Pt to perform light household tasks (folding towels, making bed) at  mod I level safely    Baseline dependent    Time 4    Period Weeks    Status On-going      OT LONG TERM GOAL #4   Title Pt to verbalize understanding with any AE and DME recommendations for greater ease/safety    Baseline dependent    Time 4    Period Weeks    Status On-going   tub bench order received. 11/17                Plan - 10/11/20 1507    Clinical Impression Statement Pt is progressing with standing balance and overall endurance for activities. Pt progressing towards goals.    OT Occupational Profile and History Detailed Assessment- Review of Records and additional review of physical, cognitive, psychosocial history related to current functional performance    Occupational performance deficits (Please refer to evaluation for details): ADL's;IADL's    Body Structure / Function / Physical Skills IADL;ADL;UE  functional use;Coordination;Balance;Mobility;FMC    Rehab Potential Good    Clinical Decision Making Several treatment options, min-mod task modification necessary    Comorbidities Affecting Occupational Performance: May have comorbidities impacting occupational performance    Modification or Assistance to Complete Evaluation  Min-Moderate modification of tasks or assist with assess necessary to complete eval    OT Frequency 2x / week    OT Duration 4 weeks   PLUS eval   OT Treatment/Interventions Self-care/ADL training;DME and/or AE instruction;Therapeutic activities;Therapeutic exercise;Coping strategies training;Neuromuscular education;Functional Mobility Training;Passive range of motion;Manual Therapy;Patient/family education    Plan HEP for coordination, LUE strengthening and coordination    Recommended Other Services --    Consulted and Agree with Plan of Care Patient           Patient will benefit from skilled therapeutic intervention in order to improve the following deficits and impairments:   Body Structure / Function / Physical Skills: IADL, ADL, UE functional use, Coordination, Balance, Mobility, FMC       Visit Diagnosis: Muscle weakness (generalized)  Other lack of coordination  Hemiplegia and hemiparesis following cerebral infarction affecting right dominant side (HCC)  Unsteadiness on feet  Other abnormalities of gait and mobility    Problem List Patient Active Problem List   Diagnosis Date Noted  . Alcohol use disorder, severe, in sustained remission, dependence (HCC) 09/05/2020  . Dysphagia 08/21/2020  . AKI (acute kidney injury) (HCC) 08/07/2020  . Hypokalemia 08/07/2020  . Drooling 08/07/2020  . Acute CVA (cerebrovascular accident) (HCC) 08/04/2020  . HTN (hypertension) 08/04/2020  . B12 deficiency 08/04/2020  . Protein-calorie malnutrition, severe (HCC) 08/04/2020    Junious Dresser MOT, OTR/L  10/11/2020, 4:25 PM  [facility] [address] [place], [place], [address] Phone: [phone]   Fax:  [phone]  Name: Adam [name] MRN: [id number] Date of Birth: 12/20/1960

## 2020-10-12 NOTE — Therapy (Addendum)
[facility] [address] [place], [place], [address] Phone: [phone]   Fax:  [phone]  Physical Therapy Treatment  Patient Details  Name: Adam [name] MRN: [id number] Date of Birth: 1961-03-31 Referring Provider (PT): Roberto Scales, MD. Followed by Ihor Austin, NP   Encounter Date: 10/11/2020   PT End of Session - 10/11/20 1412    Visit Number 5    Number of Visits 16   updated POC   Date for PT Re-Evaluation 12/04/20   POC for 7 weeks, Cert for 90 days   Authorization Type Self Pay Until MCD Switched. Currently [facility] MCD    PT Start Time 1406   therapist running behind   PT Stop Time 1445    PT Time Calculation (min) 39 min    Equipment Utilized During Treatment Gait belt    Activity Tolerance Patient tolerated treatment well;Patient limited by fatigue;No increased pain    Behavior During Therapy Flat affect;Impulsive;WFL for tasks assessed/performed           History reviewed. No pertinent past medical history.  Past Surgical History:  Procedure Laterality Date  . BUBBLE STUDY  08/09/2020   Procedure: BUBBLE STUDY;  Surgeon: Pricilla Riffle, MD;  Location: [facility] ENDOSCOPY;  Service: Cardiovascular;;  . IR GASTROSTOMY TUBE MOD SED  08/16/2020  . TEE WITHOUT CARDIOVERSION N/A 08/09/2020   Procedure: TRANSESOPHAGEAL ECHOCARDIOGRAM (TEE);  Surgeon: Pricilla Riffle, MD;  Location: [facility] ENDOSCOPY;  Service: Cardiovascular;  Laterality: N/A;    There were no vitals filed for this visit.   Subjective Assessment - 10/11/20 1411    Subjective No new complatins. Denies any pain or falls. Nodded head "yes" to fatigue after last session. Pt verbalizing some this session.    Pertinent History HTN, prior stroke (residual right-sided weakness and dysarthria)    Limitations Standing;Walking;House hold activities    Patient Stated Goals Get Stronger    Currently in Pain? No/denies    Pain Score 0-No pain                               [facility] Adult PT Treatment/Exercise - 10/11/20 1414      Transfers   Transfers Sit to Stand;Stand to Sit    Sit to Stand 5: Supervision;With upper extremity assist;From chair/3-in-1;From bed    Stand to Sit 5: Supervision;With upper extremity assist;To bed;To chair/3-in-1      Ambulation/Gait   Ambulation/Gait Yes    Ambulation/Gait Assistance 5: Supervision;4: Min guard    Ambulation/Gait Assistance Details continued with use of posterior ottobock brace to left LE with gait.     Ambulation Distance (Feet) 115 Feet   x1, plus around gym   Assistive device Rolling walker    Gait Pattern Step-through pattern;Decreased arm swing - right;Decreased step length - right;Decreased step length - left;Decreased stance time - right;Decreased dorsiflexion - right;Narrow base of support;Poor foot clearance - right    Ambulation Surface Level;Indoor      High Level Balance   High Level Balance Comments on blue mat in parallel bars:  Side stepping, high knee marching forward/backwards, then tandem gait forward/backwards for 4 laps each. Cues on posture and weight shifting. Min guard assist with light UE support on bars.               Balance Exercises - 10/11/20 1426      Balance Exercises: Standing   Standing Eyes Closed  Head turns;Foam/compliant surface;Narrow base of support (BOS);Wide (BOA);Other reps (comment);30 secs;Limitations    Standing Eyes Closed Limitations on airex with no UE suporrt: feet together for EC for 30 sec's x 3 reps, then feet hip width apart for EC head movements left<>right, up<>down               PT Short Term Goals - 09/30/20 1328      PT SHORT TERM GOAL #1   Title Patient will be independent with initial HEP for strengtening/balance with caregiver assistance (ALL STGs Due after Initial 3 Visit)             PT Long Term Goals - 09/23/20 1605      PT LONG TERM GOAL #1   Title Patient will be independent  with final HEP for balance/strength with caregiver assistance (All LTGs due after 16th Visit)    Baseline no HEP established    Time 8    Period Weeks    Status New      PT LONG TERM GOAL #2   Title Patient will demo ability to complete 5x sit <> stand in </= 12 seconds to demonstrate improved functional strength    Baseline 23.09    Time 8    Period Weeks    Status New      PT LONG TERM GOAL #3   Title Patient will demo ability to ascend/descend 4 stairs, alternating pattern, with single rail on L, supervision for improved entrance/exit of home environment    Baseline CGA step to pattern, bilateral rails    Time 8    Period Weeks    Status New      PT LONG TERM GOAL #4   Title Patient will improve TUG to </= 15 seconds w/ LRAD to demo reduced fall risk    Baseline 27.25 secs    Time 8    Period Weeks    Status New      PT LONG TERM GOAL #5   Title Patient will improve gait speed to >/= 1.5 ft/sec to demo improved household mobility    Baseline 0.82 ft/sec    Time 8    Period Weeks    Status New      PT LONG TERM GOAL #6   Title Patient will demo ability to ambulate on indoor/outdoor surfaces >/=  500 ft with LRAD and supervision    Baseline 115 ft CGA    Time 8    Period Weeks    Status New                 Plan - 10/11/20 1413    Clinical Impression Statement Todays skilled session continued to focus on gait with brace and balance training with up to min assist needed at times. The pt is progressing toward goals and should benefit from continued PT to progress toward unmet goals.    Personal Factors and Comorbidities Comorbidity 2    Comorbidities HTN, prior stroke (residual right-sided weakness and dysarthria)    Examination-Activity Limitations Stand;Stairs;Locomotion Systems developer Activity;Yard Work    Conservation officer, historic buildings Evolving/Moderate complexity    Rehab  Potential Fair    PT Frequency 2x / week    PT Duration 8 weeks   followed by 1x/week for 4 weeks   PT Treatment/Interventions ADLs/Self Care Home Management;Electrical Stimulation;DME Instruction;Gait training;Stair training;Functional mobility training;Therapeutic activities;Therapeutic exercise;Balance training;Neuromuscular re-education;Patient/family education;Orthotic Fit/Training;Manual techniques;Passive range of motion    PT Next  Visit Plan continue with trial of posterior Ottobock brace; work on balance progressing from solid surface to compliant surfaces, dynamic balance as well    PT Home Exercise Plan pt is aphasic (can follow commands well, responds to yes/no)    Consulted and Agree with Plan of Care Patient;Family member/caregiver    Family Member Consulted Sister (Clara)           Patient will benefit from skilled therapeutic intervention in order to improve the following deficits and impairments:  Abnormal gait, Decreased balance, Decreased endurance, Decreased mobility, Difficulty walking, Pain, Decreased strength, Decreased safety awareness, Decreased knowledge of use of DME, Decreased activity tolerance, Decreased knowledge of precautions, Decreased range of motion  Visit Diagnosis: Hemiplegia and hemiparesis following cerebral infarction affecting right dominant side (HCC)  Unsteadiness on feet  Other abnormalities of gait and mobility  Difficulty in walking, not elsewhere classified     Problem List Patient Active Problem List   Diagnosis Date Noted  . Alcohol use disorder, severe, in sustained remission, dependence (HCC) 09/05/2020  . Dysphagia 08/21/2020  . AKI (acute kidney injury) (HCC) 08/07/2020  . Hypokalemia 08/07/2020  . Drooling 08/07/2020  . Acute CVA (cerebrovascular accident) (HCC) 08/04/2020  . HTN (hypertension) 08/04/2020  . B12 deficiency 08/04/2020  . Protein-calorie malnutrition, severe (HCC) 08/04/2020    Sallyanne Kuster, PTA,  [facility] [address], [address] [place], [place] [address] [phone] 10/12/20, 2:59 PM   Name: Adam [name] MRN: [id number] Date of Birth: 1961/08/20

## 2020-10-15 ENCOUNTER — Ambulatory Visit: Payer: Medicaid Other

## 2020-10-15 ENCOUNTER — Ambulatory Visit: Payer: Medicaid Other | Admitting: Occupational Therapy

## 2020-10-15 ENCOUNTER — Other Ambulatory Visit: Payer: Self-pay

## 2020-10-15 ENCOUNTER — Encounter: Payer: Self-pay | Admitting: Occupational Therapy

## 2020-10-15 DIAGNOSIS — M6281 Muscle weakness (generalized): Secondary | ICD-10-CM

## 2020-10-15 DIAGNOSIS — R2689 Other abnormalities of gait and mobility: Secondary | ICD-10-CM | POA: Diagnosis not present

## 2020-10-15 DIAGNOSIS — I69351 Hemiplegia and hemiparesis following cerebral infarction affecting right dominant side: Secondary | ICD-10-CM

## 2020-10-15 DIAGNOSIS — R278 Other lack of coordination: Secondary | ICD-10-CM

## 2020-10-15 NOTE — Therapy (Signed)
[facility] [address] [place], [place], [address] Phone: [phone]   Fax:  [phone]  Occupational Therapy Treatment  Patient Details  Name: Adam [name] MRN: [id number] Date of Birth: 03-08-1961 No data recorded  Encounter Date: 10/15/2020   OT End of Session - 10/15/20 1620    Visit Number 4    Number of Visits 9    Date for OT Re-Evaluation 10/23/20    Authorization Type MCD - Out of network    OT Start Time 1620    OT Stop Time 1700    OT Time Calculation (min) 40 min    Activity Tolerance Patient tolerated treatment well    Behavior During Therapy Flat affect           History reviewed. No pertinent past medical history.  Past Surgical History:  Procedure Laterality Date  . BUBBLE STUDY  08/09/2020   Procedure: BUBBLE STUDY;  Surgeon: Pricilla Riffle, MD;  Location: [facility] ENDOSCOPY;  Service: Cardiovascular;;  . IR GASTROSTOMY TUBE MOD SED  08/16/2020  . TEE WITHOUT CARDIOVERSION N/A 08/09/2020   Procedure: TRANSESOPHAGEAL ECHOCARDIOGRAM (TEE);  Surgeon: Pricilla Riffle, MD;  Location: [facility] ENDOSCOPY;  Service: Cardiovascular;  Laterality: N/A;    There were no vitals filed for this visit.   Subjective Assessment - 10/15/20 1621    Subjective  Pt denies any pain today. Pt seated edge of mat upon arrival.    Pertinent History Acute Rt CVA w/ Lt hemiparesis on 08/03/20. 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 No/denies            Treatment:  LUE medium peg board with moderately complex peg design. Pt required moderate visual and verbal cues for identifying mistakes and correcting errors.   Coordination HEP - see pt instructions  Pt escorted to car and sister in parking lot with SBA.                OT Education - 10/15/20 1849     Education Details Coordination HEP for LUE - see pt instructions    Person(s) Educated Patient    Methods Explanation;Demonstration;Handout    Comprehension Verbalized understanding;Returned demonstration               OT Long Term Goals - 10/11/20 1517      OT LONG TERM GOAL #1   Title Independent with coordination HEP and LUE strengthening HEP (low range theraband)    Baseline dependent    Time 4    Period Weeks    Status On-going   yellow theraband ex 11/19     OT LONG TERM GOAL #2   Title Improve coordination Lt hand as evidenced by performing 9 hole peg test in 38 sec or less    Baseline 45.94 sec    Time 4    Period Weeks    Status On-going      OT LONG TERM GOAL #3   Title Pt to perform light household tasks (folding towels, making bed) at mod I level safely    Baseline dependent    Time 4    Period Weeks    Status On-going      OT LONG TERM GOAL #4   Title Pt to verbalize understanding with any AE and DME recommendations for greater ease/safety  Baseline dependent    Time 4    Period Weeks    Status On-going   tub bench order received. 11/17                Plan - 10/15/20 1627    Clinical Impression Statement Pt is progressing towards goals. Pt with increased coordination in LUE. Pt presents with slightly brighter affect this day but overall still flat.    OT Occupational Profile and History Detailed Assessment- Review of Records and additional review of physical, cognitive, psychosocial history related to current functional performance    Occupational performance deficits (Please refer to evaluation for details): ADL's;IADL's    Body Structure / Function / Physical Skills IADL;ADL;UE functional use;Coordination;Balance;Mobility;FMC    Rehab Potential Good    Clinical Decision Making Several treatment options, min-mod task modification necessary    Comorbidities Affecting Occupational Performance: May have comorbidities impacting occupational  performance    Modification or Assistance to Complete Evaluation  Min-Moderate modification of tasks or assist with assess necessary to complete eval    OT Frequency 2x / week    OT Duration 4 weeks   PLUS eval   OT Treatment/Interventions Self-care/ADL training;DME and/or AE instruction;Therapeutic activities;Therapeutic exercise;Coping strategies training;Neuromuscular education;Functional Mobility Training;Passive range of motion;Manual Therapy;Patient/family education    Plan HEP for coordination, LUE strengthening and coordination    Consulted and Agree with Plan of Care Patient           Patient will benefit from skilled therapeutic intervention in order to improve the following deficits and impairments:   Body Structure / Function / Physical Skills: IADL, ADL, UE functional use, Coordination, Balance, Mobility, FMC       Visit Diagnosis: Muscle weakness (generalized)  Other lack of coordination  Hemiplegia and hemiparesis following cerebral infarction affecting right dominant side [facility])    Problem List Patient Active Problem List   Diagnosis Date Noted  . Alcohol use disorder, severe, in sustained remission, dependence (HCC) 09/05/2020  . Dysphagia 08/21/2020  . AKI (acute kidney injury) (HCC) 08/07/2020  . Hypokalemia 08/07/2020  . Drooling 08/07/2020  . Acute CVA (cerebrovascular accident) (HCC) 08/04/2020  . HTN (hypertension) 08/04/2020  . B12 deficiency 08/04/2020  . Protein-calorie malnutrition, severe (HCC) 08/04/2020    Junious Dresser MOT, OTR/L  10/15/2020, 6:50 PM  [facility] [address] [place], [place], [address] Phone: [phone]   Fax:  [phone]  Name: Adam [name] MRN: [id number] Date of Birth: 1961/06/04

## 2020-10-15 NOTE — Therapy (Signed)
[facility] [address] [place], [place], [address] Phone: [phone]   Fax:  [phone]  Physical Therapy Treatment  Patient Details  Name: Adam [name] MRN: [id number] Date of Birth: 04/08/1961 Referring Provider (PT): Roberto Scales, MD. Followed by Ihor Austin, NP   Encounter Date: 10/15/2020   PT End of Session - 10/15/20 1536    Visit Number 6    Number of Visits 16   updated POC   Date for PT Re-Evaluation 12/04/20   POC for 7 weeks, Cert for 90 days   Authorization Type Self Pay Until MCD Switched. Currently [facility] MCD    PT Start Time 1533    PT Stop Time 1615    PT Time Calculation (min) 42 min    Equipment Utilized During Treatment Gait belt    Activity Tolerance Patient tolerated treatment well;No increased pain    Behavior During Therapy Flat affect;Impulsive;WFL for tasks assessed/performed           History reviewed. No pertinent past medical history.  Past Surgical History:  Procedure Laterality Date  . BUBBLE STUDY  08/09/2020   Procedure: BUBBLE STUDY;  Surgeon: Pricilla Riffle, MD;  Location: [facility] ENDOSCOPY;  Service: Cardiovascular;;  . IR GASTROSTOMY TUBE MOD SED  08/16/2020  . TEE WITHOUT CARDIOVERSION N/A 08/09/2020   Procedure: TRANSESOPHAGEAL ECHOCARDIOGRAM (TEE);  Surgeon: Pricilla Riffle, MD;  Location: [facility] ENDOSCOPY;  Service: Cardiovascular;  Laterality: N/A;    There were no vitals filed for this visit.   Subjective Assessment - 10/15/20 1536    Subjective Pt denies any falls. Shakes head "no" to having fatigue after last session.    Pertinent History HTN, prior stroke (residual right-sided weakness and dysarthria)    Limitations Standing;Walking;House hold activities    Patient Stated Goals Get Stronger    Currently in Pain? No/denies                             [facility] Adult PT Treatment/Exercise - 10/15/20 1537      Transfers   Transfers  Sit to Stand;Stand to Sit    Sit to Stand 5: Supervision    Sit to Stand Details Verbal cues for sequencing    Sit to Stand Details (indicate cue type and reason) Pt was cued to lean forward more.    Stand to Sit 5: Supervision    Stand to Sit Details (indicate cue type and reason) Verbal cues for sequencing    Comments Pt performed sit to stand x 10 from mat with hands with cues to lean forward a little more and get balance each time      Ambulation/Gait   Ambulation/Gait Yes    Ambulation/Gait Assistance 5: Supervision;4: Min guard    Ambulation/Gait Assistance Details Pt was cued to increase step length and pick up foot from hip more on left. Also cued to stay up tall. At end of session removed left AFO and walked 60' without it. Pt did not have any recurvatum and again needed some cues to increase left hip flexion and step length but not a significant difference.    Ambulation Distance (Feet) 230 Feet   70' x 1 without AFO at end.   Assistive device Rolling walker   left posterior ottobock AFO   Gait Pattern Step-through pattern;Decreased step length - right;Decreased step length - left;Decreased hip/knee flexion - left    Ambulation Surface Level;Indoor  Neuro Re-ed    Neuro Re-ed Details  In // bars: marching gait with 1 UE support 6' x 6, gait without UE support 6' x 4 with cues to stay up tall, reciprocal steps over 3 2x4" bolsters x 6 with 1 UE support, side stepping over the 3 foam beams x 3 each direction with verbal cues to leave enough room for trailing foot and keep feet facing foward. Step-ups on 4" step with LLE x 10 with PT providing tactile cues at knee but no recurvatum or instability noted. Repeated with adding airex on top of step x 10. Standing on airex without UE support x 30 sec then adding in head turns left/right x 10 then tapping step in front x 10 with 1 UE support CGA.                  PT Education - 10/15/20 1629    Education Details PT discussed  holding on brace right now to see how return is going as pt did not have any episodes of recurvatum noted today.    Person(s) Educated Patient    Methods Explanation    Comprehension Verbalized understanding            PT Short Term Goals - 09/30/20 1328      PT SHORT TERM GOAL #1   Title Patient will be independent with initial HEP for strengtening/balance with caregiver assistance (ALL STGs Due after Initial 3 Visit)             PT Long Term Goals - 09/23/20 1605      PT LONG TERM GOAL #1   Title Patient will be independent with final HEP for balance/strength with caregiver assistance (All LTGs due after 16th Visit)    Baseline no HEP established    Time 8    Period Weeks    Status New      PT LONG TERM GOAL #2   Title Patient will demo ability to complete 5x sit <> stand in </= 12 seconds to demonstrate improved functional strength    Baseline 23.09    Time 8    Period Weeks    Status New      PT LONG TERM GOAL #3   Title Patient will demo ability to ascend/descend 4 stairs, alternating pattern, with single rail on L, supervision for improved entrance/exit of home environment    Baseline CGA step to pattern, bilateral rails    Time 8    Period Weeks    Status New      PT LONG TERM GOAL #4   Title Patient will improve TUG to </= 15 seconds w/ LRAD to demo reduced fall risk    Baseline 27.25 secs    Time 8    Period Weeks    Status New      PT LONG TERM GOAL #5   Title Patient will improve gait speed to >/= 1.5 ft/sec to demo improved household mobility    Baseline 0.82 ft/sec    Time 8    Period Weeks    Status New      PT LONG TERM GOAL #6   Title Patient will demo ability to ambulate on indoor/outdoor surfaces >/=  500 ft with LRAD and supervision    Baseline 115 ft CGA    Time 8    Period Weeks    Status New  Plan - 10/15/20 1630    Clinical Impression Statement PT again trialed left posterior ottobock AFO. Pt did not have  any noted knee instability with brace but at end of session when removed no instability noted then as well. Will plan to hold on ordering brace right now as pt seems to be getting more return and not safety risk at this time but will continue to assess need.    Personal Factors and Comorbidities Comorbidity 2    Comorbidities HTN, prior stroke (residual right-sided weakness and dysarthria)    Examination-Activity Limitations Stand;Stairs;Locomotion Systems developer Activity;Yard Work    Conservation officer, historic buildings Evolving/Moderate complexity    Rehab Potential Fair    PT Frequency 2x / week    PT Duration 8 weeks   followed by 1x/week for 4 weeks   PT Treatment/Interventions ADLs/Self Care Home Management;Electrical Stimulation;DME Instruction;Gait training;Stair training;Functional mobility training;Therapeutic activities;Therapeutic exercise;Balance training;Neuromuscular re-education;Patient/family education;Orthotic Fit/Training;Manual techniques;Passive range of motion    PT Next Visit Plan Continue with gait training with RW as well as gait with 1 UE support on counter or // bars. Continue to assess need of posterior ottobock AFO on left but hold on ordering for now as seems to be showing better left knee control. Continue with left hip and knee strengthening and balance exercises.    PT Home Exercise Plan pt is aphasic (can follow commands well, responds to yes/no)    Consulted and Agree with Plan of Care Patient;Family member/caregiver    Family Member Consulted Sister (Clara)           Patient will benefit from skilled therapeutic intervention in order to improve the following deficits and impairments:  Abnormal gait, Decreased balance, Decreased endurance, Decreased mobility, Difficulty walking, Pain, Decreased strength, Decreased safety awareness, Decreased knowledge of use of DME, Decreased activity  tolerance, Decreased knowledge of precautions, Decreased range of motion  Visit Diagnosis: Other abnormalities of gait and mobility  Muscle weakness (generalized)  Hemiplegia and hemiparesis following cerebral infarction affecting right dominant side [facility])     Problem List Patient Active Problem List   Diagnosis Date Noted  . Alcohol use disorder, severe, in sustained remission, dependence (HCC) 09/05/2020  . Dysphagia 08/21/2020  . AKI (acute kidney injury) (HCC) 08/07/2020  . Hypokalemia 08/07/2020  . Drooling 08/07/2020  . Acute CVA (cerebrovascular accident) (HCC) 08/04/2020  . HTN (hypertension) 08/04/2020  . B12 deficiency 08/04/2020  . Protein-calorie malnutrition, severe (HCC) 08/04/2020    Ronn Melena, PT, DPT, NCS 10/15/2020, 4:35 PM  [facility] [address] [place], [place], [address] Phone: [phone]   Fax:  [phone]  Name: Smaran Gaus MRN: [id number] Date of Birth: Jan 29, 1961

## 2020-10-15 NOTE — Patient Instructions (Signed)
  Coordination Activities  Perform the following activities for 20 minutes 2 times per day with left hand(s).   Rotate ball in fingertips (clockwise and counter-clockwise).  Toss ball between hands.  Toss ball in air and catch with the same hand.  Flip cards 1 at a time as fast as you can.  Deal cards with your thumb (Hold deck in hand and push card off top with thumb).  Pick up coins, buttons, marbles, dried beans/pasta of different sizes and place in container.  Pick up coins and place in container or coin bank.  Pick up coins and stack.  Pick up coins one at a time until you get 5-10 in your hand, then move coins from palm to fingertips to stack one at a time. 

## 2020-10-21 ENCOUNTER — Ambulatory Visit: Payer: Medicaid Other

## 2020-10-21 ENCOUNTER — Other Ambulatory Visit: Payer: Self-pay

## 2020-10-21 DIAGNOSIS — R41841 Cognitive communication deficit: Secondary | ICD-10-CM

## 2020-10-21 DIAGNOSIS — R471 Dysarthria and anarthria: Secondary | ICD-10-CM

## 2020-10-21 DIAGNOSIS — R1312 Dysphagia, oropharyngeal phase: Secondary | ICD-10-CM

## 2020-10-21 DIAGNOSIS — R2689 Other abnormalities of gait and mobility: Secondary | ICD-10-CM | POA: Diagnosis not present

## 2020-10-21 NOTE — Patient Instructions (Signed)
  EXERCISES TO DO TWO TIMES A DAY  LIP EXERCISES (to help your swallowing)  1) Press your lips together as hard as you can  - 15-20 times  2) Say "oooooo" and then "eeeeeee" - (lips forward and back) - 15-20 times  3) SWALLOW *HARD* 5-10 times    TALKING EXERCISES (to help your talking) SHOUT those sentences I gave you Get a BREATH before you say each one - SHOUT!!

## 2020-10-21 NOTE — Therapy (Signed)
[facility] [address] [place], [place], [address] Phone: [phone]   Fax:  [phone]  Speech Language Pathology Evaluation  Patient Details  Name: Adam [name] MRN: [id number] Date of Birth: 06-Aug-1961 Referring Provider (SLP): Ihor Austin, NP   Encounter Date: 10/21/2020   End of Session - 10/22/20 0029    Visit Number 1    Number of Visits 16   however, limited due to medicaid visit limit.   Date for SLP Re-Evaluation 01/10/21   90 days   Authorization Type medicaid - pt type changing likely in Dec 2021    SLP Start Time 0933    SLP Stop Time  1015    SLP Time Calculation (min) 42 min    Activity Tolerance Patient tolerated treatment well           History reviewed. No pertinent past medical history.  Past Surgical History:  Procedure Laterality Date  . BUBBLE STUDY  08/09/2020   Procedure: BUBBLE STUDY;  Surgeon: Pricilla Riffle, MD;  Location: [facility] ENDOSCOPY;  Service: Cardiovascular;;  . IR GASTROSTOMY TUBE MOD SED  08/16/2020  . TEE WITHOUT CARDIOVERSION N/A 08/09/2020   Procedure: TRANSESOPHAGEAL ECHOCARDIOGRAM (TEE);  Surgeon: Pricilla Riffle, MD;  Location: [facility] ENDOSCOPY;  Service: Cardiovascular;  Laterality: N/A;    There were no vitals filed for this visit.   Subjective Assessment - 10/21/20 1013    Subjective Pt arrives alone with rag to wipe mouth. Clint picked up his PEG tube when asked "What's the most frustrating thing for you after this last CVA?"    Currently in Pain? Yes    Pain Score 2     Pain Location Hip    Pain Orientation Left    Pain Descriptors / Indicators Sore    Pain Type Acute pain    Pain Onset More than a month ago    Pain Frequency Constant              SLP Evaluation [facility] - 10/21/20 1013      SLP Visit Information   SLP Received On 10/21/20    Referring Provider (SLP) Ihor Austin, NP    Onset Date 08-05-20    Medical Diagnosis HPI rt CVA  Pt presented  to ED on 08-03-20 with slurred speech, AMS, lt weakness. Imaging found patchy small volume acute rt ronto-parietal MCA infarct, with advanced cerebral atrophy, and chronic microvascular ischemic disease, multiple remote lacunar infarcts around the basal ganglia and cerebellum. PMH previous CVA 2013 and pt with residual rt-sided weakness and dysarthria. Hx of ETOH abuse now in remission.     Subjective   Patient/Family Stated Goal Pt indicates he wants to eat again and improve his speech      Prior Functional Status   Cognitive/Linguistic Baseline Baseline deficits    Baseline deficit details dysarthria    Type of Home Apartment    Education graduated high school    Vocation On disability      Cognition   Overall Cognitive Status No family/caregiver present to determine baseline cognitive functioning   SLP suspects possible attention deficits   Attention Selective    Selective Attention Impaired    Selective Attention Impairment --   looking out window every 4-5 minutes     Auditory Comprehension   Overall Auditory Comprehension Appears within functional limits for tasks assessed      Verbal Expression   Overall Verbal Expression Appears within functional limits for tasks assessed  Oral Motor/Sensory Function   Overall Oral Motor/Sensory Function Impaired at baseline    Labial ROM Reduced left    Labial Symmetry Abnormal symmetry left    Labial Strength Reduced Left    Labial Coordination Reduced   lt side   Lingual ROM Reduced left    Lingual Symmetry Abnormal symmetry left    Lingual Strength Reduced   left more than rt   Lingual Coordination Reduced    Velum Impaired right;Impaired left    Overall Oral Motor/Sensory Function Pt with what appears to be significant oral weakness and incoordination. Pt consistently drooling from lt labial margin during eval, with open mouth posture. SLP cued pt to close mouth and pt did so 3/3.       Motor Speech   Overall Motor Speech  Impaired    Respiration Impaired    Level of Impairment Phrase    Phonation Low vocal intensity   low 60s dB consistently   Articulation Impaired    Level of Impairment Phrase   at baseline, but pt stated no request for repeat pre-2021CVA   Intelligibility Intelligibility reduced    Phrase 50-74% accurate    Sentence 50-74% accurate    Conversation 50-74% accurate    Motor Planning Witnin functional limits    Effective Techniques Increased vocal intensity   incr'd to upper 60s dB with SLP cue "SHOUT!"; and intelligibilty incr to 90%        Pt had modified (MBSS) on 08-05-20 with results as follows: "Pt presents with oropharyngeal dysphagia characterized by reduced labial seal, impaired bolus propulsion, impaired bolus cohesion, reduced lingual retraction, reduced anterior laryngeal movement, and a pharyngeal delay. He demonstrated anterior spillage, oral holding, lingual pumping, piecemeal deglutition, premature spillage, vallecular residue, pyriform sinus residue, penetration (PAS 3) of thin liquids and silent aspiration (PAS 8) of nectar thick liquids secondary to the pharyngeal delay. Pt's most significant impairment is oral holding with it sometimes being over a minute in length. Pt required verbal and tactile cues to initiate a swallow and anterior spillage was still noted intermittently when pt attempted to swallow.  Pt's swallow was most timely with with puree solids and honey thick liquids via cup. Amount of pharyngeal residue increased with bolus sizes. 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 Pt is safest with his current diet of dysphagia 1 (puree) solids and honey thick liquids." Pt has been on PEG tube since, drinking water. He states he coughs with water "sometimes". Today, pt demonstrated no coughing with 5 small sips of water at 5 separate occasions, with SLP cues for small sips and hard swallows. SLP noted pt with delay of approx 4-6  seconds with H2O boluses. Pt with delays of up to 6-10 seconds when asked to swallow his saliva x3. The first time SLP asked pt to swallow his saliva during oral motor exam resulted in a 2 second delay. Pt with what appeared to be reduced thyroid excursion with all swallows.  Pt had mod amount of drooling throughout eval today, mainly from pt's lt labial structure. Pt was cued to close his mouth and did so each time upon SLP request, x3. He was provided with 1 labial strengthening and one labial ROM exercise. SLP reiterated that if there is a strength component to his labial movement deficits in speech and in swallowing/oral secretion management it will improve with CONSISTENT completion of HEP. Pt agreed to be consistent with his HEP at BID completion.  SLP Education - 10/22/20 0027    Education Details HEPs for improving swallowing/secretion management and for improving speech clarity and breath support    Person(s) Educated Patient    Methods Explanation;Demonstration;Verbal cues;Handout    Comprehension Verbalized understanding;Returned demonstration;Verbal cues required;Need further instruction            SLP Short Term Goals - 10/22/20 [id number]      SLP SHORT TERM GOAL #1   Title pt will follow any swallow/aspiration precautions suggested for POs on most current modified barium swallow exam, with rare min A x3 sessions    Baseline no exam yet/no precautions    Time 4    Period --   sessions, for all STGs   Status New      SLP SHORT TERM GOAL #2   Title pt will complete HEP for dysarthria with rare min A x3 sessions    Baseline total A    Time 4    Period --   sessions   Status New      SLP SHORT TERM GOAL #3   Title pt will complete HEP for dysphagia with rare min A x3 sessions    Baseline total A    Time 4    Period --   sessions   Status New      SLP SHORT TERM GOAL #4   Title pt will improve speech intelligibility to 90% for a 5 minute  conversation in 2 sessions with modified independence (using speech compensations and provided one repeat)    Baseline 65%    Time 4    Period --   sessions   Status New      SLP SHORT TERM GOAL #5   Title pt will demo understanding of 3 overt s/sx of aspiration PNA by telling them to SLP with modified independence    Baseline not provided yet    Time 2    Period --   sessions   Status New            SLP Long Term Goals - 10/22/20 [id number]      SLP LONG TERM GOAL #1   Title pt will improve speech intelligibility to 90% for a 8 minute simple conversation in 3 sessions with modified independence (using speech compensations)    Baseline 65%    Time 9    Period --   sessions, for all LTGs   Status New      SLP LONG TERM GOAL #2   Title pt will follow recommendations of the swallow/aspiration precautions form most current modified barium swallow exam with POs in 3 sessions, with modified indpendence (written notes)    Baseline modified barium swallow exam to be scheduled    Time 12    Period --   sessions   Status New      SLP LONG TERM GOAL #3   Title pt will report a decr in frequency of drooling during the day    Baseline consistently during the day    Time 12    Period --   sessions   Status New      SLP LONG TERM GOAL #4   Title in therapy session #12 pt will report an increase in speech-based QOL compared to therapy session #1    Baseline QOL mesaure to be taken in first therapy session    Time 12    Period --   sessions   Status New      SLP  LONG TERM GOAL #5   Title In session #12 pt will report less frustration with POs/eating than during eval    Baseline dysphagia is "most frustrating" thing after his CVA    Time 12    Period --   sessions   Status New            Plan - 10/22/20 0030    Clinical Impression Statement Adam Hern ("Clint") presents today with mod flaccid-type dysarthria c/b imprecise consonants and decr'd volume due to decr'd breath  support. He tells SLP that it is frustrating for him to have to repeat everything he says. He was approx 90% intelligible with SLP cues for "SHOUT!" in simple question and answer conversational exhange, improved from approx 65% intelligibility when he walked into ST room and participated in simple question and answer exchange. His volume also improved from low 60s dB to uppper 60s dB (WNL volume is low 70s dB) with SLP cue to "SHOUT!" Clint also presents with ongoing persistent oral and pharyngeal dysphagia. Since it has been almost 3 months since his most current MBSS, a re-evaluation of swallowing skills would be prudent and is recommended for goal-planning, and to begin to give pt some objective data for understanding long term prognosis. SLP expects some improvement but prognosis for mainly [name] diet at this time remains guarded. Presence of cognitive deficit cannot be completely ruled out at this time - pt noted to look out window during evaluation at inappropriate times. Testing will be completed in this area if clinically indicated and pt desires. Right now Clint is living with his sister and brother in law. SLP recommends skilled ST for improvement of speech intelligibility, and swallowing skills/secretion management (and possibly improvement in cognitive linguistic function) to improve pt's QOL.    Speech Therapy Frequency 2x / week    Duration 8 weeks   but authorization for 12 total visits will be requested from Medicaid (4 in 2021, and 8 in 2022)- pt does not wish to be selfpay after Medicaid benefits are exhausted so pt will be scheduled out x1/week x12 weeks.Frequency and duration may be modified in the future if pt desires.   Treatment/Interventions Aspiration precaution training;Pharyngeal strengthening exercises;Diet toleration management by SLP;Trials of upgraded texture/liquids;Cognitive reorganization;Internal/external aids;Patient/family education;Compensatory strategies;SLP instruction and  feedback;Multimodal communcation approach;Environmental controls;Oral motor exercises    Potential to Achieve Goals Fair    Potential Considerations Severity of impairments;Previous level of function;Cooperation/participation level;Financial resources;Family/community support    SLP Home Exercise Plan provided today    Consulted and Agree with Plan of Care Patient           Patient will benefit from skilled therapeutic intervention in order to improve the following deficits and impairments:   Oropharyngeal dysphagia  Dysarthria and anarthria  Cognitive communication deficit    Problem List Patient Active Problem List   Diagnosis Date Noted  . Alcohol use disorder, severe, in sustained remission, dependence (HCC) 09/05/2020  . Dysphagia 08/21/2020  . AKI (acute kidney injury) (HCC) 08/07/2020  . Hypokalemia 08/07/2020  . Drooling 08/07/2020  . Acute CVA (cerebrovascular accident) (HCC) 08/04/2020  . HTN (hypertension) 08/04/2020  . B12 deficiency 08/04/2020  . Protein-calorie malnutrition, severe (HCC) 08/04/2020    [facility] ,MS, CCC-SLP  10/22/2020, 9:40 AM  [facility] [address] [place], [place], [address] Phone: [phone]   Fax:  [phone]  Name: Adam [name] MRN: [id number] Date of Birth: [birth year]

## 2020-10-22 ENCOUNTER — Ambulatory Visit: Payer: Medicaid Other

## 2020-10-22 ENCOUNTER — Ambulatory Visit: Payer: Medicaid Other | Admitting: Occupational Therapy

## 2020-10-22 ENCOUNTER — Ambulatory Visit: Payer: Medicaid Other | Admitting: Physical Therapy

## 2020-10-23 ENCOUNTER — Ambulatory Visit: Payer: Medicaid Other | Admitting: Nurse Practitioner

## 2020-10-24 ENCOUNTER — Ambulatory Visit: Payer: Medicaid Other | Admitting: Physical Therapy

## 2020-10-24 ENCOUNTER — Ambulatory Visit: Payer: Medicaid Other | Admitting: Occupational Therapy

## 2020-10-25 ENCOUNTER — Ambulatory Visit: Payer: Medicaid Other | Attending: Internal Medicine

## 2020-10-28 ENCOUNTER — Ambulatory Visit: Payer: Medicaid Other | Attending: Physician Assistant | Admitting: Physician Assistant

## 2020-10-28 ENCOUNTER — Encounter: Payer: Self-pay | Admitting: Physician Assistant

## 2020-10-28 ENCOUNTER — Ambulatory Visit: Payer: Medicaid Other | Admitting: Occupational Therapy

## 2020-10-28 ENCOUNTER — Other Ambulatory Visit: Payer: Self-pay | Admitting: Adult Health

## 2020-10-28 ENCOUNTER — Ambulatory Visit: Payer: Medicaid Other | Admitting: Rehabilitation

## 2020-10-28 ENCOUNTER — Other Ambulatory Visit: Payer: Self-pay

## 2020-10-28 VITALS — BP 127/88 | HR 83 | Temp 98.3°F | Resp 18 | Ht 72.0 in | Wt 130.0 lb

## 2020-10-28 DIAGNOSIS — I69391 Dysphagia following cerebral infarction: Secondary | ICD-10-CM

## 2020-10-28 DIAGNOSIS — Z8673 Personal history of transient ischemic attack (TIA), and cerebral infarction without residual deficits: Secondary | ICD-10-CM | POA: Diagnosis not present

## 2020-10-28 DIAGNOSIS — I10 Essential (primary) hypertension: Secondary | ICD-10-CM | POA: Diagnosis not present

## 2020-10-28 DIAGNOSIS — B070 Plantar wart: Secondary | ICD-10-CM | POA: Diagnosis not present

## 2020-10-28 DIAGNOSIS — Z114 Encounter for screening for human immunodeficiency virus [HIV]: Secondary | ICD-10-CM

## 2020-10-28 DIAGNOSIS — E785 Hyperlipidemia, unspecified: Secondary | ICD-10-CM

## 2020-10-28 DIAGNOSIS — Z125 Encounter for screening for malignant neoplasm of prostate: Secondary | ICD-10-CM

## 2020-10-28 DIAGNOSIS — R7989 Other specified abnormal findings of blood chemistry: Secondary | ICD-10-CM

## 2020-10-28 DIAGNOSIS — K117 Disturbances of salivary secretion: Secondary | ICD-10-CM

## 2020-10-28 MED ORDER — CARVEDILOL 12.5 MG PO TABS: 12.5000 mg | ORAL_TABLET | Freq: Every day | ORAL | 0 refills | Status: DC

## 2020-10-28 MED ORDER — ATORVASTATIN CALCIUM 80 MG PO TABS: 80.0000 mg | ORAL_TABLET | Freq: Every day | ORAL | 0 refills | Status: DC

## 2020-10-28 MED FILL — CARVEDILOL 12.5 MG TABLET: 12.5 | 60 days supply | Qty: 60 | Fill #0

## 2020-10-28 MED FILL — ATORVASTATIN CALCIUM 80 MG: 80 | 60 days supply | Qty: 60 | Fill #0

## 2020-10-28 NOTE — Progress Notes (Signed)
Established Patient Office Visit  Subjective:  Patient ID: Adam [name], male    DOB: 05/10/1961  Age: 59 y.o. MRN: [id number]  CC:  Chief Complaint  Patient presents with  . Medication Refill    [facility], sister is present, she acts as his primary caregiver and reports history due to patient's disability.  Reports that they have followed up with neurology, blood pressure medications were reduced to once daily, states that blood pressure at home remains within normal limits.  Reports that he continues to have a wart on the bottom of his foot near his heel, has not tried anything for relief, denies pain.  History reviewed. No pertinent past medical history.  Past Surgical History:  Procedure Laterality Date  . BUBBLE STUDY  08/09/2020   Procedure: BUBBLE STUDY;  Surgeon: Pricilla Riffleoss, Paula V, MD;  [facility];  Service: Cardiovascular;;  . IR GASTROSTOMY TUBE MOD SED  08/16/2020  . TEE WITHOUT CARDIOVERSION N/A 08/09/2020   Procedure: TRANSESOPHAGEAL ECHOCARDIOGRAM (TEE);  Surgeon: Pricilla Riffleoss, Paula V, MD;  [facility];  Service: Cardiovascular;  Laterality: N/A;    Family History  Problem Relation Age of Onset  . Cancer Mother   . Other Brother        shot    Social History   Socioeconomic History  . Marital status: Single    Spouse name: Not on file  . Number of children: 5  . Years of education: Not on file  . Highest education level: Not on file  Occupational History  . Not on file  Tobacco Use  . Smoking status: Current Every Day Smoker    Types: Cigarettes  . Smokeless tobacco: Never Used  Vaping Use  . Vaping Use: Never used  Substance and Sexual Activity  . Alcohol use: Not Currently    Alcohol/week: 1.0 standard drink    Types: 1 Cans of beer per week  . Drug use: Not Currently    Types: Marijuana    Comment: cigarette 1 pck/week  . Sexual activity: Yes    Partners: Female    Birth  control/protection: Condom  Other Topics Concern  . Not on file  Social History Narrative  . Not on file   Social Determinants of Health   Financial Resource Strain:   . Difficulty of Paying Living Expenses: Not on file  Food Insecurity:   . Worried About Programme researcher, broadcasting/film/videounning Out of Food in the Last Year: Not on file  . Ran Out of Food in the Last Year: Not on file  Transportation Needs:   . Lack of Transportation (Medical): Not on file  . Lack of Transportation (Non-Medical): Not on file  Physical Activity:   . Days of Exercise per Week: Not on file  . Minutes of Exercise per Session: Not on file  Stress:   . Feeling of Stress : Not on file  Social Connections:   . Frequency of Communication with Friends and Family: Not on file  . Frequency of Social Gatherings with Friends and Family: Not on file  . Attends Religious Services: Not on file  . Active Member of Clubs or Organizations: Not on file  . Attends BankerClub or Organization Meetings: Not on file  . Marital Status: Not on file  Intimate Partner Violence:   . Fear of Current or Ex-Partner: Not on file  . Emotionally Abused: Not on file  . Physically Abused: Not on file  . Sexually Abused: Not on file  Outpatient Medications Prior to Visit  Medication Sig Dispense Refill  . Multiple Vitamin (MULTIVITAMIN) capsule Take 1 capsule by mouth daily.    . Nutritional Supplements (FEEDING SUPPLEMENT, OSMOLITE 1.5 CAL,) LIQD Place 1,000 mLs into feeding tube continuous. Osmolite 1.2 cal @ 78ml/hr, 73ml Prosource TF dialy 1000 mL 10  . atorvastatin (LIPITOR) 80 MG tablet Place 1 tablet (80 mg total) into feeding tube daily. 60 tablet 0  . carvedilol (COREG) 12.5 MG tablet Place 1 tablet (12.5 mg total) into feeding tube 2 (two) times daily with a meal. 120 tablet 0   No facility-administered medications prior to visit.    No Known Allergies  ROS Review of Systems  Constitutional: Negative.   HENT: Positive for drooling.   Eyes:  Negative.   Respiratory: Negative for shortness of breath.   Cardiovascular: Negative for chest pain.  Gastrointestinal: Negative.   Endocrine: Negative.   Genitourinary: Negative.   Musculoskeletal: Negative.   Skin: Negative.   Allergic/Immunologic: Negative.   Neurological: Negative.   Hematological: Negative.   Psychiatric/Behavioral: Negative.       Objective:    Physical Exam Vitals and nursing note reviewed.  Constitutional:      General: He is not in acute distress.    Appearance: Normal appearance.  HENT:     Head: Normocephalic.     Right Ear: External ear normal.     Left Ear: External ear normal.     Nose: Nose normal.     Mouth/Throat:     Mouth: Mucous membranes are moist.     Pharynx: Oropharynx is clear.     Comments: Constant drooling noted Eyes:     Extraocular Movements: Extraocular movements intact.     Conjunctiva/sclera: Conjunctivae normal.     Pupils: Pupils are equal, round, and reactive to light.  Cardiovascular:     Rate and Rhythm: Normal rate.     Pulses: Normal pulses.     Heart sounds: Normal heart sounds.  Pulmonary:     Effort: Pulmonary effort is normal.  Abdominal:     General: Abdomen is flat.     Palpations: Abdomen is soft.     Comments: PEG tube present  Musculoskeletal:     Cervical back: Normal range of motion and neck supple.     Comments: In wheelchair  Skin:    General: Skin is warm.  Neurological:     General: No focal deficit present.     Mental Status: He is alert and oriented to person, place, and time.  Psychiatric:        Mood and Affect: Mood normal.        Behavior: Behavior normal.        Thought Content: Thought content normal.        Judgment: Judgment normal.     BP 127/88 (BP Location: Right Arm, Patient Position: Sitting, Cuff Size: Normal)   Pulse 83   Temp 98.3 F (36.8 C) (Oral)   Resp 18   Ht 6' (1.829 m)   Wt 130 lb (59 kg)   SpO2 100%   BMI 17.63 kg/m  Wt Readings from Last 3  Encounters:  10/28/20 130 lb (59 kg)  09/25/20 123 lb (55.8 kg)  09/05/20 123 lb 3.2 oz (55.9 kg)     Health Maintenance Due  Topic Date Due  . COVID-19 Vaccine (1) Never done  . TETANUS/TDAP  Never done  . COLONOSCOPY  Never done  . INFLUENZA VACCINE  Never done  There are no preventive care reminders to display for this patient.  Lab Results  Component Value Date   TSH 0.748 10/28/2020   Lab Results  Component Value Date   WBC 5.7 09/05/2020   HGB 13.1 09/05/2020   HCT 40.1 09/05/2020   MCV 77 (L) 09/05/2020   PLT 255 09/05/2020   Lab Results  Component Value Date   NA 142 09/05/2020   K 5.1 09/05/2020   CO2 20 09/05/2020   GLUCOSE 105 (H) 09/05/2020   BUN 30 (H) 09/05/2020   CREATININE 0.93 09/05/2020   BILITOT 0.2 10/28/2020   ALKPHOS 114 10/28/2020   AST 100 (H) 10/28/2020   ALT 224 (H) 10/28/2020   PROT 6.6 10/28/2020   ALBUMIN 3.8 10/28/2020   CALCIUM 9.6 09/05/2020   ANIONGAP 12 08/27/2020   Lab Results  Component Value Date   CHOL 82 (L) 10/28/2020   Lab Results  Component Value Date   HDL 33 (L) 10/28/2020   Lab Results  Component Value Date   LDLCALC 38 10/28/2020   Lab Results  Component Value Date   TRIG 40 10/28/2020   Lab Results  Component Value Date   CHOLHDL 2.5 10/28/2020   Lab Results  Component Value Date   HGBA1C 5.7 (H) 08/06/2020      Assessment & Plan:   Problem List Items Addressed This Visit      Cardiovascular and Mediastinum   HTN (hypertension) - Primary   Relevant Medications   atorvastatin (LIPITOR) 80 MG tablet   carvedilol (COREG) 12.5 MG tablet   Other Relevant Orders   TSH (Completed)     Musculoskeletal and Integument   Plantar wart of right foot     Other   Drooling   Elevated LFTs   Relevant Orders   Hepatic Function Panel (Completed)   Hepatitis panel, acute (Completed)   History of CVA (cerebrovascular accident)   Hyperlipidemia   Relevant Medications   atorvastatin (LIPITOR) 80  MG tablet   carvedilol (COREG) 12.5 MG tablet   Other Relevant Orders   Lipid panel (Completed)    Other Visit Diagnoses    Screening for malignant neoplasm of prostate       Relevant Orders   PSA (Completed)   Screening for HIV without presence of risk factors        1. Primary hypertension Continue current regimen, reducing carvedilol to 1 tablet a day indicated.  Continue to check blood pressure at home, and inform office of elevated blood pressure readings - TSH - carvedilol (COREG) 12.5 MG tablet; Place 1 tablet (12.5 mg total) into feeding tube daily with breakfast.  Dispense: 60 tablet; Refill: 0  2. Drooling   3. Plantar wart of right foot Gave patient education on over-the-counter treatments  4. History of CVA (cerebrovascular accident) Continue follow-up with neurology  5. Elevated LFTs Patient has history of alcohol abuse, elevated LFTs likely from this, and did have some improvement at last office visit, recheck today, will check hepatitis panel - Hepatic Function Panel - Hepatitis panel, acute  6. Screening for malignant neoplasm of prostate  - PSA  7. Screening for HIV without presence of risk factors   8. Hyperlipidemia, unspecified hyperlipidemia type  - Lipid panel - atorvastatin (LIPITOR) 80 MG tablet; Place 1 tablet (80 mg total) into feeding tube daily.  Dispense: 60 tablet; Refill: 0  Patient has upcoming appointment to establish care with Dr. Delford Field November 05, 2020.  Meds ordered this encounter  Medications  .  atorvastatin (LIPITOR) 80 MG tablet    Sig: Place 1 tablet (80 mg total) into feeding tube daily.    Dispense:  60 tablet    Refill:  0    Order Specific Question:   Supervising Provider    Answer:   Delford Field, PATRICK E [[id number]]  . carvedilol (COREG) 12.5 MG tablet    Sig: Place 1 tablet (12.5 mg total) into feeding tube daily with breakfast.    Dispense:  60 tablet    Refill:  0    Dose change    Order Specific Question:    Supervising Provider    Answer:   Storm Frisk [[id number]]    I have reviewed the patient's medical history (PMH, PSH, Social History, Family History, Medications, and allergies) , and have been updated if relevant. I spent 30 minutes reviewing chart and  face to face time with patient.     Follow-up: No follow-ups on file.    Kasandra Knudsen Mayers, PA-C

## 2020-10-28 NOTE — Addendum Note (Signed)
Addended by: Raliegh Ip on: 10/28/2020 11:06 AM   Modules accepted: Orders

## 2020-10-28 NOTE — Progress Notes (Signed)
err

## 2020-10-28 NOTE — Progress Notes (Signed)
Order placed for barium swallow per Verdie Mosher, SLP request with OP SLP evaluation 10/21/2020

## 2020-10-28 NOTE — Patient Instructions (Signed)
Continue your blood pressure and cholesterol medication as directed.  Unfortunately there is not a medication that your insurance will pay for currently to help resolve the plantar wart.  I have enclosed information on how to treat at home.  We will call you with your lab results.  Roney Jaffe, PA-C Physician Assistant [facility] [email or web address]   Plantar Warts Warts are small growths on the skin. When they occur on the underside (sole) of the foot, they are called plantar warts. Plantar warts often occur in groups, with several small warts around a larger wart. They tend to develop on the heel or the ball of the foot. They may grow into the deeper layers of skin or rise above the surface of the skin. Most warts are not painful, and they usually do not cause problems. However, plantar warts may cause pain when you walk because pressure is applied to them. Plantar warts may spread to other areas of the sole. They can also spread to other areas of the body through direct and indirect contact. Warts often go away on their own in time. Various treatments may be done if needed or desired. What are the causes? Plantar warts are caused by a type of virus that is called human papillomavirus (HPV).  Walking barefoot can cause exposure to the virus, especially if your feet are wet.  HPV attacks a break in the skin of the foot. What increases the risk? You are more likely to develop this condition if you:  Are between 55-78 years of age.  Use public showers or locker rooms.  Have a weakened body defense system (immune system). What are the signs or symptoms? Common symptoms of this condition include:  Flat or slightly raised growths that have a rough surface and look similar to a callus.  Pain when you use your foot to support your body weight. How is this diagnosed? A plantar wart can usually be diagnosed from its appearance. In  some cases, a tissue sample may be removed (biopsy) to be looked at under a microscope. How is this treated? In many cases, warts do not need treatment. Without treatment, they often go away with time. If treatment is needed or desired, options may include:  Applying medicated solutions, creams, or patches to the wart. These may be over-the-counter or prescription medicines that make the skin soft so that layers will gradually shed away. In many cases, the medicine is applied one or two times a day and covered with a bandage.  Freezing the wart with liquid nitrogen (cryotherapy).  Burning the wart with: ? Laser treatment. ? An electrified probe (electrocautery).  Injecting a medicine (Candida antigen) into the wart to help the body's immune system fight off the wart.  Having surgery to remove the wart.  Putting duct tape over the top of the wart (occlusion). You will leave the tape in place for as long as told by your health care provider, and then you will replace it with a new strip of tape. This is done until the wart goes away. Repeat treatment may be needed if you choose to remove warts. Warts sometimes go away and come back again. Follow these instructions at home:  Apply medicated creams or solutions only as told by your health care provider. This may involve: ? Soaking the affected area in warm water. ? Removing the top layer of softened skin before you apply the medicine. A pumice stone works well for removing the skin. ? Applying  a bandage over the affected area after you apply the medicine. ? Repeating the process daily or as told by your health care provider.  Do not scratch or pick at a wart.  Wash your hands after you touch a wart.  If a wart is painful, try covering it with a bandage that has a hole in the middle. This helps to take pressure off the wart.  Keep all follow-up visits as told by your health care provider. This is important. How is this prevented? Take  these actions to help prevent warts:  Wear shoes and socks. Change your socks daily.  Keep your feet clean and dry.  Do not walk barefoot in shared locker rooms, shower areas, or swimming pools.  Check your feet regularly.  Avoid direct contact with warts on other people. Contact a health care provider if:  Your warts do not improve after treatment.  You have redness, swelling, or pain at the site of a wart.  You have bleeding from a wart that does not stop with light pressure.  You have diabetes and you develop a wart. Summary  Warts are small growths on the skin. When they occur on the underside (sole) of the foot, they are called plantar warts.  In many cases, warts do not need treatment. Without treatment, they often go away with time.  Apply medicated creams or solutions only as told by your health care provider.  Do not scratch or pick at a wart. Wash your hands after you touch a wart.  Keep all follow-up visits as told by your health care provider. This is important. This information is not intended to replace advice given to you by your health care provider. Make sure you discuss any questions you have with your health care provider. Document Revised: 06/07/2018 Document Reviewed: 06/07/2018 Elsevier Patient Education  2020 [employer].

## 2020-10-28 NOTE — Progress Notes (Signed)
Patient has eaten today. Patient has had medication today. Patient denies pain at this time. Patient is requesting medication for foot. Patient is only taking BP medication 1 time a day. Patients sister would like to confirm in aspirin is still needed.

## 2020-10-29 ENCOUNTER — Encounter: Payer: Self-pay | Admitting: Physician Assistant

## 2020-10-29 ENCOUNTER — Ambulatory Visit: Payer: Medicaid Other

## 2020-10-29 DIAGNOSIS — Z8673 Personal history of transient ischemic attack (TIA), and cerebral infarction without residual deficits: Secondary | ICD-10-CM | POA: Insufficient documentation

## 2020-10-29 DIAGNOSIS — B070 Plantar wart: Secondary | ICD-10-CM

## 2020-10-29 DIAGNOSIS — R7989 Other specified abnormal findings of blood chemistry: Secondary | ICD-10-CM | POA: Insufficient documentation

## 2020-10-29 DIAGNOSIS — E785 Hyperlipidemia, unspecified: Secondary | ICD-10-CM | POA: Insufficient documentation

## 2020-10-29 HISTORY — DX: Plantar wart: B07.0

## 2020-10-29 LAB — LIPID PANEL
Chol/HDL Ratio: 2.5 ratio (ref 0.0–5.0)
Cholesterol, Total: 82 mg/dL — ABNORMAL LOW (ref 100–199)
HDL: 33 mg/dL — ABNORMAL LOW (ref >39)
LDL Chol Calc (NIH): 38 mg/dL (ref 0–99)
Triglycerides: 40 mg/dL (ref 0–149)
VLDL Cholesterol Cal: 11 mg/dL (ref 5–40)

## 2020-10-29 LAB — HEPATIC FUNCTION PANEL
ALT: 224 IU/L — ABNORMAL HIGH (ref 0–44)
AST: 100 IU/L — ABNORMAL HIGH (ref 0–40)
Albumin: 3.8 g/dL (ref 3.8–4.9)
Alkaline Phosphatase: 114 IU/L (ref 44–121)
Bilirubin Total: 0.2 mg/dL (ref 0.0–1.2)
Bilirubin, Direct: <0.1 mg/dL (ref 0.00–0.40)
Total Protein: 6.6 g/dL (ref 6.0–8.5)

## 2020-10-29 LAB — HEPATITIS PANEL, ACUTE
Hep A IgM: NEGATIVE
Hep B C IgM: NEGATIVE
Hep C Virus Ab: <0.1 s/co ratio (ref 0.0–0.9)
Hepatitis B Surface Ag: NEGATIVE

## 2020-10-29 LAB — TSH: TSH: 0.748 u[IU]/mL (ref 0.450–4.500)

## 2020-10-29 LAB — PSA: Prostate Specific Ag, Serum: 0.7 ng/mL (ref 0.0–4.0)

## 2020-10-30 ENCOUNTER — Telehealth: Payer: Self-pay | Admitting: *Deleted

## 2020-10-30 ENCOUNTER — Encounter: Payer: Medicaid Other | Admitting: Occupational Therapy

## 2020-10-30 ENCOUNTER — Ambulatory Visit: Payer: Medicaid Other | Admitting: Rehabilitation

## 2020-10-30 NOTE — Telephone Encounter (Signed)
Pt's sister called back stating that she missed a call from the office after discussing this information with CMA. Please advise, she states no VM was left [phone]

## 2020-10-30 NOTE — Telephone Encounter (Signed)
-----   Message from Roney Jaffe, [place] sent at 10/29/2020 12:14 PM EST ----- Please call patient and let him know that his liver function is still elevated, is improved from 1 month ago.  His cholesterol is low, it would be okay for him to take 1/2 tablet of the atorvastatin, 40 mg.  This may also help improve the liver function.  He was negative for screening for hepatitis AB and C.  Thyroid was within normal limits, and screening for prostate cancer was negative.  We will recheck the liver function in 1 to 2 months, cholesterol in approximately 6 months

## 2020-10-30 NOTE — Telephone Encounter (Signed)
Patient's sister verified his DOB. Patients sister is aware of liver function improving from a month ago with cholesterol level still being low. Patient advised to take 1/2 tablet of atorvastatin and having a recheck of his liver in 2 months with cholesterol being rechecked in 6 months. Patient is also aware of PSA/HEP A/B/C all being negative.

## 2020-11-04 ENCOUNTER — Ambulatory Visit: Payer: Medicaid Other | Admitting: Rehabilitation

## 2020-11-04 ENCOUNTER — Encounter: Payer: Medicaid Other | Admitting: Occupational Therapy

## 2020-11-05 ENCOUNTER — Other Ambulatory Visit: Payer: Self-pay

## 2020-11-05 ENCOUNTER — Encounter: Payer: Self-pay | Admitting: Critical Care Medicine

## 2020-11-05 ENCOUNTER — Ambulatory Visit: Payer: Medicaid Other | Attending: Critical Care Medicine | Admitting: Critical Care Medicine

## 2020-11-05 DIAGNOSIS — E876 Hypokalemia: Secondary | ICD-10-CM

## 2020-11-05 DIAGNOSIS — R1312 Dysphagia, oropharyngeal phase: Secondary | ICD-10-CM | POA: Diagnosis not present

## 2020-11-05 DIAGNOSIS — Z8673 Personal history of transient ischemic attack (TIA), and cerebral infarction without residual deficits: Secondary | ICD-10-CM | POA: Diagnosis not present

## 2020-11-05 DIAGNOSIS — E538 Deficiency of other specified B group vitamins: Secondary | ICD-10-CM

## 2020-11-05 DIAGNOSIS — E785 Hyperlipidemia, unspecified: Secondary | ICD-10-CM

## 2020-11-05 DIAGNOSIS — I10 Essential (primary) hypertension: Secondary | ICD-10-CM

## 2020-11-05 DIAGNOSIS — R7989 Other specified abnormal findings of blood chemistry: Secondary | ICD-10-CM

## 2020-11-05 DIAGNOSIS — F1021 Alcohol dependence, in remission: Secondary | ICD-10-CM

## 2020-11-05 DIAGNOSIS — N179 Acute kidney failure, unspecified: Secondary | ICD-10-CM

## 2020-11-05 NOTE — Assessment & Plan Note (Signed)
Acute renal injury has resolved

## 2020-11-05 NOTE — Assessment & Plan Note (Signed)
Continue atorvastatin at a reduced dose

## 2020-11-05 NOTE — Progress Notes (Addendum)
Subjective:    Patient ID: Adam [name], male    DOB: [birth year], 59 y.o.   MRN: [id number] Virtual Visit via Telephone Note  I connected with [facility] on 11/05/20 at  3:00 PM EST by telephone and verified that I am speaking with the correct person using two identifiers.   Consent:  I discussed the limitations, risks, security and privacy concerns of performing an evaluation and management service by telephone and the availability of in person appointments. I also discussed with the patient that there may be a patient responsible charge related to this service. The patient expressed understanding and agreed to proceed.  Location of patient:pt at home  Location of provider: in my office  Persons participating in the televisit with the patient.  Sister ms Wells assisted and on the call     History of Present Illness:    Observations/Objective:   Assessment and Plan:    59 y.o.[name] to est pcp.  Saw Dr swords in Oct/2021 Hx of CVA, HTN, AKI, ETOH, elev LFTs, HLD.  11/05/2020 This is a 59 year old male seen by way of a telephone visit to establish primary care.  Patient previously been seen by Dr. Cato Mulligan in October for a face-to-face visit.  History of alcohol use disorder that was severe but he is now no longer drinking after his recent stroke.  He is now living with his sister who is caring for him.  The patient is only taking minimal liquids with his pills.  He maintains tube feedings on a continuous basis at this time.  The patient was talked to briefly on the phone states that he is having no complaints.  The sister states she is not having any major issues with him and feels he is getting stronger.  He is receiving physical therapy and speech and swallowing therapy  Patient is also stop smoking as well.  Blood pressure was 127/88 on a December 6 recent visit where he was seen for refills on his medications by the physician assistant Mayers   He was on atorvastatin  80 mg a day but the dose has been reduced to 40 mg a day because he still has elevated liver function tests related to prior alcohol use.  Kidney function has improved.  He is only received one of the Covid vaccine series the sister cannot remember the name it was done in [facility].  History reviewed. No pertinent past medical history.   Family History  Problem Relation Age of Onset  . Cancer Mother   . Other Brother        shot     Social History   Socioeconomic History  . Marital status: Single    Spouse name: Not on file  . Number of children: 5  . Years of education: Not on file  . Highest education level: Not on file  Occupational History  . Not on file  Tobacco Use  . Smoking status: Former Smoker    Types: Cigarettes  . Smokeless tobacco: Never Used  Vaping Use  . Vaping Use: Never used  Substance and Sexual Activity  . Alcohol use: Not Currently    Alcohol/week: 1.0 standard drink    Types: 1 Cans of beer per week  . Drug use: Not Currently    Types: Marijuana    Comment: cigarette 1 pck/week  . Sexual activity: Yes    Partners: Female    Birth control/protection: Condom  Other Topics Concern  . Not on file  Social History Narrative  . Not on file   Social Determinants of Health   Financial Resource Strain: Not on file  Food Insecurity: Not on file  Transportation Needs: Not on file  Physical Activity: Not on file  Stress: Not on file  Social Connections: Not on file  Intimate Partner Violence: Not on file     No Known Allergies   Outpatient Medications Prior to Visit  Medication Sig Dispense Refill  . atorvastatin (LIPITOR) 80 MG tablet Place 1 tablet (80 mg total) into feeding tube daily. (Patient taking differently: Place 40 mg into feeding tube daily.) 60 tablet 0  . carvedilol (COREG) 12.5 MG tablet Place 1 tablet (12.5 mg total) into feeding tube daily with breakfast. 60 tablet 0  . Multiple Vitamin (MULTIVITAMIN) capsule Take 1 capsule  by mouth daily.    . Nutritional Supplements (FEEDING SUPPLEMENT, OSMOLITE 1.5 CAL,) LIQD Place 1,000 mLs into feeding tube continuous. Osmolite 1.2 cal @ 72ml/hr, 42ml Prosource TF dialy 1000 mL 10   No facility-administered medications prior to visit.      Review of Systems  HENT: Positive for trouble swallowing.   Respiratory: Negative for cough, choking, chest tightness and shortness of breath.   Cardiovascular: Negative.   Gastrointestinal: Negative.   Genitourinary: Negative.   Musculoskeletal: Negative.   Allergic/Immunologic: Negative.   Neurological: Positive for weakness.  Psychiatric/Behavioral: Negative.        Objective:   Physical Exam There were no vitals filed for this visit. No results found.  No exam this is a phone visit     Assessment & Plan:  I personally reviewed all images and lab data in the [facility] system as well as any outside material available during this office visit and agree with the  radiology impressions.   AKI (acute kidney injury) (HCC) Acute renal injury has resolved  Alcohol use disorder, severe, in sustained remission, dependence (HCC) Alcohol use is improved  B12 deficiency Resolving  History of CVA (cerebrovascular accident) Continue speech-language therapy  HTN (hypertension) Blood pressure controlled  Dysphagia Still requiring tube feedings  Elevated LFTs Hyperlipidemia treated with atorvastatin dose has been reduced elevated liver functions will need to be rechecked  Hyperlipidemia Continue atorvastatin at a reduced dose  Hypokalemia Resolved   Diagnoses and all orders for this visit:  Hyperlipidemia, unspecified hyperlipidemia type  Elevated LFTs  History of CVA (cerebrovascular accident)  Oropharyngeal dysphagia  Alcohol use disorder, severe, in sustained remission, dependence (HCC)  Primary hypertension  AKI (acute kidney injury) (HCC)  B12 deficiency  Hypokalemia    Follow Up  Instructions: Patient knows a face-to-face exam will occur in January patient to come in sooner for tetanus and flu vaccinations for nurse visit   I discussed the assessment and treatment plan with the patient. The patient was provided an opportunity to ask questions and all were answered. The patient agreed with the plan and demonstrated an understanding of the instructions.   The patient was advised to call back or seek an in-person evaluation if the symptoms worsen or if the condition fails to improve as anticipated.  I provided 30 minutes of non-face-to-face time during this encounter  including  median intraservice time , review of notes, labs, imaging, medications  and explaining diagnosis and management to the patient .    Shan Levans, MD

## 2020-11-05 NOTE — Assessment & Plan Note (Signed)
Continue speech-language therapy

## 2020-11-05 NOTE — Assessment & Plan Note (Signed)
Alcohol use is improved

## 2020-11-05 NOTE — Progress Notes (Signed)
Discuss elevated liver enzymes and medication management.

## 2020-11-05 NOTE — Assessment & Plan Note (Signed)
Blood pressure controlled. 

## 2020-11-05 NOTE — Assessment & Plan Note (Signed)
Resolved

## 2020-11-05 NOTE — Assessment & Plan Note (Signed)
Hyperlipidemia treated with atorvastatin dose has been reduced elevated liver functions will need to be rechecked

## 2020-11-05 NOTE — Assessment & Plan Note (Signed)
Resolving

## 2020-11-05 NOTE — Assessment & Plan Note (Signed)
Still requiring tube feedings

## 2020-11-07 ENCOUNTER — Encounter: Payer: Medicaid Other | Admitting: Occupational Therapy

## 2020-11-07 ENCOUNTER — Ambulatory Visit: Payer: Medicaid Other | Admitting: Physical Therapy

## 2020-11-12 ENCOUNTER — Ambulatory Visit: Payer: Medicaid Other | Admitting: Physical Therapy

## 2020-11-14 ENCOUNTER — Ambulatory Visit: Payer: Medicaid Other | Admitting: Physical Therapy

## 2020-11-18 ENCOUNTER — Ambulatory Visit: Payer: Medicaid Other | Admitting: Rehabilitation

## 2020-11-19 ENCOUNTER — Ambulatory Visit: Payer: Medicaid Other

## 2020-11-20 ENCOUNTER — Ambulatory Visit: Payer: Medicaid Other | Admitting: Rehabilitation

## 2020-11-26 ENCOUNTER — Ambulatory Visit (HOSPITAL_COMMUNITY)
Admission: RE | Admit: 2020-11-26 | Discharge: 2020-11-26 | Disposition: A | Discharge status: Home / Self Care | Payer: Medicaid Other | Source: Ambulatory Visit | Attending: Adult Health | Admitting: Adult Health

## 2020-11-26 ENCOUNTER — Other Ambulatory Visit: Payer: Self-pay

## 2020-11-26 DIAGNOSIS — I69391 Dysphagia following cerebral infarction: Secondary | ICD-10-CM | POA: Diagnosis not present

## 2020-11-26 DIAGNOSIS — R1312 Dysphagia, oropharyngeal phase: Secondary | ICD-10-CM

## 2020-12-02 ENCOUNTER — Encounter: Payer: Self-pay | Admitting: Rehabilitation

## 2020-12-02 NOTE — Therapy (Signed)
[facility] [address] [place], [place], [address] Phone: [phone]   Fax:  [phone]  Patient Details  Name: Adam [name] MRN: [id number] Date of Birth: 02/20/1961 Referring Provider:  No ref. provider found  Encounter Date: 12/02/2020   PHYSICAL THERAPY DISCHARGE SUMMARY  Visits from Start of Care: 6  Current functional level related to goals / functional outcomes:  PT Long Term Goals - 09/23/20 1605      PT LONG TERM GOAL #1   Title Patient will be independent with final HEP for balance/strength with caregiver assistance (All LTGs due after 16th Visit)    Baseline no HEP established    Time 8    Period Weeks    Status New      PT LONG TERM GOAL #2   Title Patient will demo ability to complete 5x sit <> stand in </= 12 seconds to demonstrate improved functional strength    Baseline 23.09    Time 8    Period Weeks    Status New      PT LONG TERM GOAL #3   Title Patient will demo ability to ascend/descend 4 stairs, alternating pattern, with single rail on L, supervision for improved entrance/exit of home environment    Baseline CGA step to pattern, bilateral rails    Time 8    Period Weeks    Status New      PT LONG TERM GOAL #4   Title Patient will improve TUG to </= 15 seconds w/ LRAD to demo reduced fall risk    Baseline 27.25 secs    Time 8    Period Weeks    Status New      PT LONG TERM GOAL #5   Title Patient will improve gait speed to >/= 1.5 ft/sec to demo improved household mobility    Baseline 0.82 ft/sec    Time 8    Period Weeks    Status New      PT LONG TERM GOAL #6   Title Patient will demo ability to ambulate on indoor/outdoor surfaces >/=  500 ft with LRAD and supervision    Baseline 115 ft CGA    Time 8    Period Weeks    Status New             Remaining deficits: Unsure as PT received message from family that they did not wish to return at this time.     Education / Equipment: HEP, RW  Plan: Patient agrees to discharge.  Patient goals were not met. Patient is being discharged due to the patient's request.  ?????     Cameron Sprang, PT, MPT [facility] [address] [place], [place], [address] Phone: [phone]   Fax:  [phone] 12/02/20, 10:52 AM

## 2020-12-13 NOTE — Progress Notes (Signed)
Subjective:    Patient ID: Adam [name], male    DOB: 05/04/1961, 60 y.o.   MRN: [id number]  History of Present Illness: 60 y.o.[name] to est pcp.  Saw Dr swords in Oct/2021 Hx of CVA, HTN, AKI, ETOH, elev LFTs, HLD.  11/05/2020 This is a 60 year old male seen by way of a telephone visit to establish primary care.  Patient previously been seen by Dr. Cato Mulligan in October for a face-to-face visit.  History of alcohol use disorder that was severe but he is now no longer drinking after his recent stroke.  He is now living with his sister who is caring for him.  The patient is only taking minimal liquids with his pills.  He maintains tube feedings on a continuous basis at this time.  The patient was talked to briefly on the phone states that he is having no complaints.  The sister states she is not having any major issues with him and feels he is getting stronger.  He is receiving physical therapy and speech and swallowing therapy  Patient is also stop smoking as well.  Blood pressure was 127/88 on a December 6 recent visit where he was seen for refills on his medications by the physician assistant Mayers   He was on atorvastatin 80 mg a day but the dose has been reduced to 40 mg a day because he still has elevated liver function tests related to prior alcohol use.  Kidney function has improved.  He is only received one of the Covid vaccine series the sister cannot remember the name it was done in [facility].  12/16/20 This patient is seen face-to-face for follow-up and to establish care.  Patient underwent swallowing evaluation he has mild dysphagia and is able to take some things by mouth but his primary nutrition source remains his tube feedings Patient needs refills on tube feedings.  Patient also is due for flu vaccine and tetanus vaccine and also needs a Covid booster. The patient's homecare company is [employer].  Patient is compliant with his blood pressure medications on arrival blood  pressure 112/80  Patient is still using his walker but has graduated to needing a cane and is asking for one at this visit  Past Medical History:  Diagnosis Date  . Plantar wart of right foot 10/29/2020  . Protein-calorie malnutrition, severe (HCC) 08/04/2020     Family History  Problem Relation Age of Onset  . Cancer Mother   . Other Brother        shot     Social History   Socioeconomic History  . Marital status: Single    Spouse name: Not on file  . Number of children: 5  . Years of education: Not on file  . Highest education level: Not on file  Occupational History  . Not on file  Tobacco Use  . Smoking status: Former Smoker    Types: Cigarettes  . Smokeless tobacco: Never Used  Vaping Use  . Vaping Use: Never used  Substance and Sexual Activity  . Alcohol use: Not Currently    Alcohol/week: 1.0 standard drink    Types: 1 Cans of beer per week  . Drug use: Not Currently    Types: Marijuana    Comment: cigarette 1 pck/week  . Sexual activity: Yes    Partners: Female    Birth control/protection: Condom  Other Topics Concern  . Not on file  Social History Narrative  . Not on file   Social Determinants  of Health   Financial Resource Strain: Not on file  Food Insecurity: Not on file  Transportation Needs: Not on file  Physical Activity: Not on file  Stress: Not on file  Social Connections: Not on file  Intimate Partner Violence: Not on file     No Known Allergies   Outpatient Medications Prior to Visit  Medication Sig Dispense Refill  . Multiple Vitamin (MULTIVITAMIN) capsule Take 1 capsule by mouth daily.    Marland Kitchen atorvastatin (LIPITOR) 80 MG tablet Place 1 tablet (80 mg total) into feeding tube daily. (Patient taking differently: Place 40 mg into feeding tube daily.) 60 tablet 0  . carvedilol (COREG) 12.5 MG tablet Place 1 tablet (12.5 mg total) into feeding tube daily with breakfast. 60 tablet 0  . Nutritional Supplements (FEEDING SUPPLEMENT, OSMOLITE 1.5  CAL,) LIQD Place 1,000 mLs into feeding tube continuous. Osmolite 1.2 cal @ 99ml/hr, 27ml Prosource TF dialy 1000 mL 10   No facility-administered medications prior to visit.      Review of Systems  HENT: Positive for trouble swallowing.   Respiratory: Negative for cough, choking, chest tightness and shortness of breath.   Cardiovascular: Negative.   Gastrointestinal: Negative.   Genitourinary: Negative.   Musculoskeletal: Negative.   Allergic/Immunologic: Negative.   Neurological: Negative for weakness.  Psychiatric/Behavioral: Negative.        Objective:   Physical Exam  Vitals:   12/16/20 1053  BP: 112/80  Pulse: 80  Temp: 98.2 F (36.8 C)  TempSrc: Oral  SpO2: 99%  Weight: 137 lb 3.2 oz (62.2 kg)  Height: 6' (1.829 m)    Gen: Pleasant, well-nourished, in no distress,  normal affect  ENT: No lesions,  mouth clear,  oropharynx clear, no postnasal drip, some drooling noted dentition is fair  Neck: No JVD, no TMG, no carotid bruits  Lungs: No use of accessory muscles, no dullness to percussion, clear without rales or rhonchi  Cardiovascular: RRR, heart sounds normal, no murmur or gallops, no peripheral edema  Abdomen: soft and NT, no HSM,  BS normal  Musculoskeletal: No deformities, no cyanosis or clubbing  Neuro: alert, non focal, imbalance remains  Skin: Warm, no lesions or rashes  No results found.       Assessment & Plan:  I personally reviewed all images and lab data in the [facility] system as well as any outside material available during this office visit and agree with the  radiology impressions.   HTN (hypertension) Hypertension stable at this time continue Coreg daily  Dysphagia Continue tube feedings  Alcohol use disorder, severe, in sustained remission, dependence (HCC) Note recurrent alcohol use  Elevated LFTs Follow-up metabolic panel  Hyperlipidemia Continue atorvastatin 40 mg daily  PEG (percutaneous endoscopic gastrostomy)  adjustment/replacement/removal (HCC) Continue tube feedings  Protein-calorie malnutrition, severe (HCC) This is resolved with tube feedings   Diagnoses and all orders for this visit:  Primary hypertension -     carvedilol (COREG) 12.5 MG tablet; Place 1 tablet (12.5 mg total) into feeding tube daily with breakfast. -     Comprehensive metabolic panel -     CBC with Differential/Platelet  Elevated LFTs -     Comprehensive metabolic panel  Pure hypercholesterolemia -     Lipid panel  Colon cancer screening -     Cologuard  PEG (percutaneous endoscopic gastrostomy) adjustment/replacement/removal (HCC)  Protein-calorie malnutrition, severe (HCC)  Alcohol use disorder, severe, in sustained remission, dependence (HCC)  Need for immunization against influenza -     Flu  Vaccine QUAD 36+ mos IM  Need for Tdap vaccination -     Tdap vaccine greater than or equal to 7yo IM  Oropharyngeal dysphagia  Other orders -     Nutritional Supplements (FEEDING SUPPLEMENT, OSMOLITE 1.5 CAL,) LIQD; Place 1,000 mLs into feeding tube continuous. Osmolite 1.2 cal @ 4ml/hr, 77ml Prosource TF dialy -     atorvastatin (LIPITOR) 40 MG tablet; Take 1 tablet (40 mg total) by mouth daily.  Flu vaccine Tdap given at this visit  Cologuard cancer screening will be sent to the home

## 2020-12-16 ENCOUNTER — Telehealth: Payer: Self-pay

## 2020-12-16 ENCOUNTER — Ambulatory Visit: Payer: Medicaid Other | Attending: Critical Care Medicine | Admitting: Critical Care Medicine

## 2020-12-16 ENCOUNTER — Encounter: Payer: Self-pay | Admitting: Critical Care Medicine

## 2020-12-16 ENCOUNTER — Other Ambulatory Visit: Payer: Self-pay

## 2020-12-16 VITALS — BP 112/80 | HR 80 | Temp 98.2°F | Ht 72.0 in | Wt 137.2 lb

## 2020-12-16 DIAGNOSIS — R7989 Other specified abnormal findings of blood chemistry: Secondary | ICD-10-CM

## 2020-12-16 DIAGNOSIS — I10 Essential (primary) hypertension: Secondary | ICD-10-CM

## 2020-12-16 DIAGNOSIS — Z431 Encounter for attention to gastrostomy: Secondary | ICD-10-CM

## 2020-12-16 DIAGNOSIS — E7800 Pure hypercholesterolemia, unspecified: Secondary | ICD-10-CM

## 2020-12-16 DIAGNOSIS — Z23 Encounter for immunization: Secondary | ICD-10-CM

## 2020-12-16 DIAGNOSIS — Z1211 Encounter for screening for malignant neoplasm of colon: Secondary | ICD-10-CM | POA: Diagnosis not present

## 2020-12-16 DIAGNOSIS — F1021 Alcohol dependence, in remission: Secondary | ICD-10-CM

## 2020-12-16 DIAGNOSIS — R1312 Dysphagia, oropharyngeal phase: Secondary | ICD-10-CM

## 2020-12-16 DIAGNOSIS — E43 Unspecified severe protein-calorie malnutrition: Secondary | ICD-10-CM

## 2020-12-16 MED ORDER — OSMOLITE 1.5 CAL PO LIQD: 1000.0000 mL | ORAL | 10 refills | Status: AC

## 2020-12-16 MED ORDER — ATORVASTATIN CALCIUM 40 MG PO TABS: 40.0000 mg | ORAL_TABLET | Freq: Every day | ORAL | 3 refills | Status: AC

## 2020-12-16 MED ORDER — CARVEDILOL 12.5 MG PO TABS: 12.5000 mg | ORAL_TABLET | Freq: Every day | ORAL | 1 refills | Status: DC

## 2020-12-16 NOTE — Assessment & Plan Note (Signed)
Continue tube feedings ?

## 2020-12-16 NOTE — Assessment & Plan Note (Signed)
This is resolved with tube feedings

## 2020-12-16 NOTE — Telephone Encounter (Signed)
Call placed to patient to inquire what company has been providing his tube feeding supplements.  His sister,Claire, said that she was not at home and did not have the name of the company. Instructed her to check when she gets home and call this CM back with the supplier's name and she said she would call back when she gets home

## 2020-12-16 NOTE — Assessment & Plan Note (Signed)
Continue atorvastatin 40 mg daily.  

## 2020-12-16 NOTE — Patient Instructions (Signed)
No change on tube feedings I will send refills on this to the homecare company  Continue atorvastatin a new prescription for 40 mg pill will be sent to [facility] Continue Coreg 1 daily refill sent  Tetanus and flu vaccines given today  Please obtain a Covid vaccine booster below is where you can call to get 1 COVID-19 Vaccine Information can be found at: [email or web address] For questions related to vaccine distribution or appointments, please email [email or web address] or call [phone].    A 4-prong cane was given please use this to ambulate when you are not using your walkers  Labs today include metabolic panel blood counts lipid panel  Return to see Dr. Delford Field 2 months

## 2020-12-16 NOTE — Assessment & Plan Note (Signed)
Hypertension stable at this time continue Coreg daily

## 2020-12-16 NOTE — Assessment & Plan Note (Signed)
Continue tube feedings

## 2020-12-16 NOTE — Assessment & Plan Note (Signed)
Follow-up metabolic panel 

## 2020-12-16 NOTE — Assessment & Plan Note (Signed)
Note recurrent alcohol use

## 2020-12-19 ENCOUNTER — Telehealth: Payer: Self-pay

## 2020-12-19 NOTE — Telephone Encounter (Signed)
Call placed to patient's sister, [name] # [phone] to obtain the name of the company that provides patient's feeding supplements. A new order needs to be sent to that company. Message left with call back requested to this CM

## 2020-12-22 LAB — COLOGUARD

## 2020-12-22 LAB — EXTERNAL GENERIC LAB PROCEDURE

## 2020-12-25 ENCOUNTER — Telehealth: Payer: Self-pay

## 2020-12-25 NOTE — Telephone Encounter (Signed)
Call placed to patient's sister, [name] to obtain the name of the company that provides patient's feeding supplements. She said that she does not have that information right now; but will be coming to [facility] this week and will bring it then.

## 2020-12-27 NOTE — Telephone Encounter (Signed)
The patients sister called back with the feeding supplement information that was requested. The name of the company is [employer] ([employer]) and the number is [phone]. Please advise

## 2020-12-30 ENCOUNTER — Telehealth: Payer: Self-pay

## 2020-12-30 NOTE — Telephone Encounter (Signed)
Call placed to name of the company is [employer] ( [facility]) # [phone] spoke to [facility] who confirmed that they have a record of the patient.  Order for osmolite can be faxed to # [phone].  Order than faxed as requested  Call placed to patient's sister, [name],  and informed her that the order for the supplement has been faxed to [employer]

## 2020-12-31 ENCOUNTER — Ambulatory Visit: Payer: Medicaid Other

## 2021-01-17 LAB — COLOGUARD
COLOGUARD: NEGATIVE
Cologuard: NEGATIVE

## 2021-01-17 LAB — EXTERNAL GENERIC LAB PROCEDURE: COLOGUARD: NEGATIVE

## 2021-01-20 ENCOUNTER — Telehealth: Payer: Self-pay | Admitting: Critical Care Medicine

## 2021-01-20 NOTE — Telephone Encounter (Signed)
cologuard neg,  Pt aware

## 2021-01-29 ENCOUNTER — Ambulatory Visit: Payer: Medicaid Other | Admitting: Adult Health

## 2021-02-05 ENCOUNTER — Ambulatory Visit: Payer: Medicaid Other | Attending: Critical Care Medicine

## 2021-02-05 ENCOUNTER — Other Ambulatory Visit: Payer: Self-pay

## 2021-02-06 LAB — CBC WITH DIFFERENTIAL/PLATELET
Basophils Absolute: 0 10*3/uL (ref 0.0–0.2)
Basos: 0 %
EOS (ABSOLUTE): 0.1 10*3/uL (ref 0.0–0.4)
Eos: 1 %
Hematocrit: 24.3 % — ABNORMAL LOW (ref 37.5–51.0)
Hemoglobin: 7.6 g/dL — ABNORMAL LOW (ref 13.0–17.7)
Immature Grans (Abs): 0 10*3/uL (ref 0.0–0.1)
Immature Granulocytes: 0 %
Lymphocytes Absolute: 2.1 10*3/uL (ref 0.7–3.1)
Lymphs: 32 %
MCH: 24.4 pg — ABNORMAL LOW (ref 26.6–33.0)
MCHC: 31.3 g/dL — ABNORMAL LOW (ref 31.5–35.7)
MCV: 78 fL — ABNORMAL LOW (ref 79–97)
Monocytes Absolute: 0.8 10*3/uL (ref 0.1–0.9)
Monocytes: 12 %
NRBC: 1 % — ABNORMAL HIGH (ref 0–0)
Neutrophils Absolute: 3.4 10*3/uL (ref 1.4–7.0)
Neutrophils: 55 %
Platelets: 222 10*3/uL (ref 150–450)
RBC: 3.12 x10E6/uL — ABNORMAL LOW (ref 4.14–5.80)
RDW: 15.7 % — ABNORMAL HIGH (ref 11.6–15.4)
WBC: 6.3 10*3/uL (ref 3.4–10.8)

## 2021-02-06 LAB — COMPREHENSIVE METABOLIC PANEL
ALT: 64 IU/L — ABNORMAL HIGH (ref 0–44)
AST: 50 IU/L — ABNORMAL HIGH (ref 0–40)
Albumin/Globulin Ratio: 1.7 (ref 1.2–2.2)
Albumin: 3.9 g/dL (ref 3.8–4.9)
Alkaline Phosphatase: 98 IU/L (ref 44–121)
BUN/Creatinine Ratio: 30 — ABNORMAL HIGH (ref 10–24)
BUN: 25 mg/dL (ref 8–27)
Bilirubin Total: <0.2 mg/dL (ref 0.0–1.2)
CO2: 23 mmol/L (ref 20–29)
Calcium: 9 mg/dL (ref 8.6–10.2)
Chloride: 106 mmol/L (ref 96–106)
Creatinine, Ser: 0.83 mg/dL (ref 0.76–1.27)
Globulin, Total: 2.3 g/dL (ref 1.5–4.5)
Glucose: 95 mg/dL (ref 65–99)
Potassium: 4.9 mmol/L (ref 3.5–5.2)
Sodium: 143 mmol/L (ref 134–144)
Total Protein: 6.2 g/dL (ref 6.0–8.5)
eGFR: 100 mL/min/{1.73_m2} (ref >59)

## 2021-02-06 LAB — LIPID PANEL
Chol/HDL Ratio: 2.5 ratio (ref 0.0–5.0)
Cholesterol, Total: 81 mg/dL — ABNORMAL LOW (ref 100–199)
HDL: 32 mg/dL — ABNORMAL LOW (ref >39)
LDL Chol Calc (NIH): 35 mg/dL (ref 0–99)
Triglycerides: 59 mg/dL (ref 0–149)
VLDL Cholesterol Cal: 14 mg/dL (ref 5–40)

## 2021-02-13 ENCOUNTER — Telehealth: Payer: Medicaid Other | Admitting: Critical Care Medicine

## 2021-03-17 ENCOUNTER — Ambulatory Visit: Payer: Medicaid Other | Admitting: Adult Health

## 2021-03-17 ENCOUNTER — Encounter: Payer: Self-pay | Admitting: Adult Health

## 2021-03-17 ENCOUNTER — Other Ambulatory Visit (HOSPITAL_COMMUNITY): Payer: Self-pay

## 2021-03-17 VITALS — BP 118/78 | HR 73 | Ht 72.0 in | Wt 139.0 lb

## 2021-03-17 DIAGNOSIS — R760 Raised antibody titer: Secondary | ICD-10-CM | POA: Diagnosis not present

## 2021-03-17 DIAGNOSIS — E538 Deficiency of other specified B group vitamins: Secondary | ICD-10-CM | POA: Diagnosis not present

## 2021-03-17 DIAGNOSIS — I69391 Dysphagia following cerebral infarction: Secondary | ICD-10-CM

## 2021-03-17 DIAGNOSIS — R1310 Dysphagia, unspecified: Secondary | ICD-10-CM

## 2021-03-17 DIAGNOSIS — I63511 Cerebral infarction due to unspecified occlusion or stenosis of right middle cerebral artery: Secondary | ICD-10-CM

## 2021-03-17 NOTE — Progress Notes (Signed)
[facility] [address] [place] [id number] [phone]       STROKE FOLLOW UP NOTE  [name] Date of Birth:  10/20/1961 Medical Record Number:  [id number]   Reason for Referral: stroke follow up    SUBJECTIVE:   CHIEF COMPLAINT:  Chief Complaint  Patient presents with  . Follow-up    RM 14 with sister (clara) Pt is well, slow gait. No new complications     HPI:   Today, 03/17/2021, Adam [name] returns for stroke follow-up after prior visit almost 5 months ago.  He is accompanied by his sister who provides majority of history  Stable from stroke standpoint without new stroke/TIA symptoms Residual deficits of dysphagia, gait impairment, dysarthria, and aphasia - denies any worsening Continued use of PEG tube for all oral intake including supplemental nutrition.  Barium swallow study 11/2020 continue mild dysphagia - currently on thin liquids and softened food but continues to receive continuous tube feeds - he has been taking pills whole PO without difficulty - he is questioning removal of PEG tube He has since graduated from [employer] to quad cane and denies any recent falls Speech and language greatly improved since prior visit -he has been able to talk in sentences Therapies stopped a couple months ago due to difficulty with transportation. Sister continues to work with him daily and reports ongoing improvement.  He continues to reside with his sister  Compliant on aspirin and atorvastatin 40 mg daily without associated side effects Previously on atorvastatin 80 mg daily but due to elevated LFTs, dosage decreased to 40 mg daily -recent LFTs improved and recent LDL 35 Blood pressure today 118/78.  Routinely monitored at home with sister reporting good control Tobacco, THC and EtOH continued complete abstinence   No further concerns at this time    History provided for reference purposes only Initial visit 09/25/2020 JM: Adam [name] is  being seen for stroke follow-up accompanied by his sister.  Residual deficits of dysphagia with use of PEG tube for all intake (NPO), gait impairment, speech and language difficulty, facial weakness with drooling.  He recently started outpatient PT/OT but has not received any speech therapy as these orders were not placed per sister.  Ambulating with rolling walker and denies any recent falls.  Denies new or worsening stroke/TIA symptoms. Denies new or worsening stroke/TIA symptoms.  Completed 3 weeks DAPT and remains on aspirin alone without bleeding or bruising.  Remains on atorvastatin 80 mg daily without myalgias.  Blood pressure today 112/80.  Sister routinely monitors at home which has been stable.  Reports complete tobacco and THC cessation since discharge.  No further concerns at this time.  Stroke admission 08/03/2020 [name] is a 60 y.o. male with history of hypertension, prior stroke (residual right-sided weakness and dysarthria), and active smoking (reports 1 cigarette/day) who presented on 08/03/2020 with acute onset drooling, patchy left arm and torso sensory changes, headache, cough, bifacial weakness, significantly worsened speech and gait with recent weight loss.   Personally reviewed pertinent hospitalization progress notes, lab work and imaging with summary provided.  Evaluated by Dr. Pearlean BrownieSethi with stroke work-up revealing acute right MCA patchy infarcts, secondary to embolic source vs large vessel disease.  CTA head/neck showed right M1/M2, left A2/ACA, left P2 and right V4 stenosis.  Initiated DAPT for 3 weeks then aspirin alone.  Hx of HTN not previously on antihypertensives and initiated amlodipine, lisinopril and hydrochlorothiazide with long-term BP goal normotensive range.  LDL 80  and initiate atorvastatin 80 mg daily.  B12 deficiency with supplementation.  Current tobacco use with smoking cessation counseling provided.  Other stroke risk factors include prior strokes on imaging,  prior EtOH use and THC use (+UDS).  Residual deficits of hypophonia and moderate dysarthria, dysphagia s/p PEG and R>L lower facial weakness.  Stroke: acute right MCA patchy infarcts, embolic vs. Large vessel disease   CT head - age-indeterminate infarct within the right cerebellum. Multiple chronic appearing lacunar infarcts within the bilateral basal ganglia.   MRI head - Patchy small volume acute right MCA territory infarcts involving the right frontoparietal region as above. multiple remote lacunar infarcts at bilateral basal ganglia and right cerebellum.   CTA Head and neck- right M1/M2, left A2/A3, left P2 and right P4 stenosis.  2D Echo - EF 60 to 65%.    Sars Corona Virus 2 - negative  LDL - 80  HgbA1c - 5.7   UDS - positive for THC  Hypercoagulable labs unremarkable so far, cardiolipin Ab pending  VTE prophylaxis - Lovenox  No antithrombotic prior to admission, was on aspirin 81 mg daily and Plavix 75 DAPT.  Okay to hold off Plavix for 5 days for potential PEG placement.  May consider resume DAPT for total 3 weeks once hemodynamically stable after procedure.  Patient counseled to be compliant with his antithrombotic medications  Ongoing aggressive stroke risk factor management  Therapy recommendations:  HH OT, OP PT, OP SLP, supervision for mobility  Disposition:   Home       PMH:  Past Medical History:  Diagnosis Date  . Plantar wart of right foot 10/29/2020  . Protein-calorie malnutrition, severe (HCC) 08/04/2020    PSH:  Past Surgical History:  Procedure Laterality Date  . BUBBLE STUDY  08/09/2020   Procedure: BUBBLE STUDY;  Surgeon: Pricilla Riffle, MD;  Location[facility] ENDOSCOPY;  Service: Cardiovascular;;  . IR GASTROSTOMY TUBE MOD SED  08/16/2020  . TEE WITHOUT CARDIOVERSION N/A 08/09/2020   Procedure: TRANSESOPHAGEAL ECHOCARDIOGRAM (TEE);  Surgeon: Pricilla Riffle, MD;  Location: [facility] ENDOSCOPY;  Service: Cardiovascular;  Laterality: N/A;    Social History:   Social History   Socioeconomic History  . Marital status: Single    Spouse name: Not on file  . Number of children: 5  . Years of education: Not on file  . Highest education level: Not on file  Occupational History  . Not on file  Tobacco Use  . Smoking status: Former Smoker    Types: Cigarettes  . Smokeless tobacco: Never Used  Vaping Use  . Vaping Use: Never used  Substance and Sexual Activity  . Alcohol use: Not Currently    Alcohol/week: 1.0 standard drink    Types: 1 Cans of beer per week  . Drug use: Not Currently    Types: Marijuana    Comment: cigarette 1 pck/week  . Sexual activity: Yes    Partners: Female    Birth control/protection: Condom  Other Topics Concern  . Not on file  Social History Narrative  . Not on file   Social Determinants of Health   Financial Resource Strain: Not on file  Food Insecurity: Not on file  Transportation Needs: Not on file  Physical Activity: Not on file  Stress: Not on file  Social Connections: Not on file  Intimate Partner Violence: Not on file    Family History:  Family History  Problem Relation Age of Onset  . Cancer Mother   . Other Brother  shot    Medications:   Current Outpatient Medications on File Prior to Visit  Medication Sig Dispense Refill  . atorvastatin (LIPITOR) 40 MG tablet Take 1 tablet (40 mg total) by mouth daily. 90 tablet 3  . Multiple Vitamin (MULTIVITAMIN) capsule Take 1 capsule by mouth daily.    . Nutritional Supplements (FEEDING SUPPLEMENT, OSMOLITE 1.5 CAL,) LIQD Place 1,000 mLs into feeding tube continuous. Osmolite 1.2 cal @ 65ml/hr, 91ml Prosource TF dialy 1000 mL 10   No current facility-administered medications on file prior to visit.    Allergies:  No Known Allergies    OBJECTIVE:  Physical Exam  Vitals:   03/17/21 0854  BP: 118/78  Pulse: 73  Weight: 139 lb (63 kg)  Height: 6' (1.829 m)   Body mass index is 18.85 kg/m. No exam data present  General: Frail  pleasant middle-aged African-American male, seated, in no evident distress Head: head normocephalic and atraumatic.   Neck: supple with no carotid or supraclavicular bruits Cardiovascular: regular rate and rhythm, no murmurs Musculoskeletal: no deformity Skin:  no rash/petichiae. PEG tube intact w/o evidence of infection Vascular:  Normal pulses all extremities   Neurologic Exam Mental Status: Awake and fully alert.  Hypophonia with mild to moderate dysarthria and very mild aphasia.  Able to speak in short sentences.  Follows commands without difficulty.  Oriented to place and time. Mood and affect appropriate and cooperative with exam.  Cranial Nerves: Pupils equal, briskly reactive to light. Extraocular movements full without nystagmus. Visual fields full to confrontation. Hearing intact. Facial sensation intact. Left lower facial weakness.  Tongue and palate moves normally and symmetrically. Motor: Normal bulk and tone. Normal strength in all tested extremity muscles. Sensory.:  Intact to light touch, vibratory and pinprick sensation extremities Coordination: Rapid alternating movements normal in all extremities. Finger-to-nose and heel-to-shin performed accurately bilaterally. Gait and Station: Stands from seated position with mild difficulty.  Stance is normal.  Gait demonstrates decreased stride length and step height bilaterally with mild imbalance and use of quad cane.  Tandem walk and heel toe not attempted due to safety concerns Reflexes: 1+ and symmetric. Toes downgoing.     NIHSS  3 (prior 7) Modified Rankin  3      ASSESSMENT: Braedan Meuth is a 60 y.o. year old male presented with acute onset drooling, patchy left arm and torso sensory changes, headache, cough, bifacial weakness, significantly worsening speech and gait on 08/03/2020 with stroke work-up revealing acute right MCA patchy infarcts, embolic vs large vessel disease source.  Vascular risk factors include HTN, HLD,  prior stroke with residual right-sided weakness and dysarthria, current tobacco use, intracranial stenosis THC use, B12 deficiency and prior EtOH use.      PLAN:  1. R MCA stroke :  a. Residual deficit: Dysphagia with continued PEG tube use, dysarthria, expressive aphasia, gait impairment and multiple facial weakness.  i. Order placed to repeat barium swallow study for hopeful diet upgrade  ii. Referral will be placed to nutrition services once swallow study complete as he has remained on continuous tube feed despite diet - further eval adequate p.o. intake and need of continued supplemental nutrition iii. Advised use of cane at all times unless otherwise instructed iv. Continue exercises at home for hopeful further recovery - denies interested in additional therapy due to transportation issues b. continue aspirin 81 mg daily  and atorvastatin 40 mg daily for secondary stroke prevention.  c. Discussed secondary stroke prevention measures and importance of close PCP  follow up for aggressive stroke risk factor management  d. HTN: BP goal <130/90.  Stable today. Continue f/u with PCP e. HLD: LDL goal <70. Recent LDL 35 (01/2021) from 88.  Continue atorvastatin 40 mg daily prescribed and monitored per PCP (previously on 80 mg daily for lower due to elevated LFTs) 2. Elevated anticardiolipin IgM: level 19 07/2020. Repeat cardiolipin antibodies 3. Elevated LFTs: Routinely monitored by PCP.  Recent AST/ALT 01/2021 improved compared to prior 4. B12 deficiency: Vit B level 90 and homocystine level 16.8 07/2020. Repeat levels today 5. Tobacco, etoh and THC use: Continued complete abstinence -highly encouraged continued cessation    Follow up in 4 months or call earlier if needed   CC:  [facility] provider: Dr. Randalyn Rhea, Charlcie Cradle, MD    I spent 35 minutes of face-to-face and non-face-to-face time with patient and sister.  This included previsit chart review, lab review, study review, order entry,  electronic health record documentation, patient education and discussion regarding prior stroke, residual deficits, importance of managing stroke risk factors, indication for repeat lab work, and answered all other questions to patient and sisters satisfaction   Ihor Austin, [facility]  [facility] [address] [place], [place] [address]  Phone [phone] Fax [phone] Note: This document was prepared with digital dictation and possible smart phrase technology. Any transcriptional errors that result from this process are unintentional.

## 2021-03-17 NOTE — Progress Notes (Signed)
I agree with the above plan 

## 2021-03-17 NOTE — Patient Instructions (Addendum)
Order will be placed to repeat swallowing study  Referral will be placed to nutrition for further assistance with tube feedings   Continue aspirin 81 mg daily  and atorvastatin 40mg  daily  for secondary stroke prevention  We will call you once we receive your lab work results  Continue to follow up with PCP regarding cholesterol and blood pressure management  Maintain strict control of hypertension with blood pressure goal below 130/90 and cholesterol with LDL cholesterol (bad cholesterol) goal below 70 mg/dL.   Continue to abstain from tobacco, THC and alcohol use     Followup in the future with me in 4 months or call earlier if needed       Thank you for coming to see at [facility]. I hope we have been able to provide you high quality care today.  You may receive a patient satisfaction survey over the next few weeks. We would appreciate your feedback and comments so that we may continue to improve ourselves and the health of our patients.   Dysphagia Eating Plan, Minced and Moist Foods This eating plan is for people with moderate swallowing and chewing problems who are transitioning from pureed to solid foods. Moist and minced foods are soft and cut into very small chunks so that they can be swallowed safely. On this eating plan, you may be instructed to drink liquids that are thickened. Work with your health care provider and your diet and nutrition specialist (dietitian) to make sure that you are following the eating plan safely and getting all the nutrients you need. What are tips for following this plan? General information  You may eat foods that are soft and moist.  Always test food texture before taking a bite. Poke food with a fork or spoon to make sure it is tender. Talk with your health care provider about specific ways to test the size, texture, and safety of foods.  Take small bites. Each bite should be smaller than the fingernail of your  little finger (about 4 mm by 4 mm).  If you were on a pureed food eating plan, you may still eat any of the foods included in that diet.  Avoid foods that are dry, hard, sticky, chewy, coarse, or crunchy.  Avoid foods that separate into thin liquids and solids, such as cereal with milk or chunky soups. If you eat cold cereal, let it soften in milk and then drain excess milk before eating.  Avoid liquids that have seeds or chunks.  If instructed by your health care provider, thicken liquids. Follow your health care provider's instructions about what products to use, how to do this, and to what thickness. ? You may use a commercial thickener, rice cereal, or potato flakes. ? Thickened liquids are usually a "pudding-like" consistency, or they may be as thick as honey or thick enough to eat with a spoon.   Cooking  You may need to use a blender, whisk, or masher to soften some of your foods.  To moisten foods, you may add liquids while you are blending, mashing, or grinding your foods to the right consistency. These liquids include gravies, sauces, vegetable or fruit juice, milk, half and half, or water.  Reheat foods slowly to prevent a tough crust from forming. Meal planning  Eat a variety of foods in order to get all the nutrients you need.  Follow your meal plan as told by your health care provider or dietitian. What foods can I eat? Grains  Soaked soft breads without nuts or seeds. Pancakes, sweet rolls, pastries, and [place] toast that have been moistened with syrup or sauce. Well-cooked pasta, noodles, rice, and bread dressing in very small pieces and thick sauce. Soft dumplings or spaetzle in very small pieces and butter or gravy. Soft-cooked cereals. Cold cereal that has been softened in milk and the excess milk drained. Fruits Canned or cooked fruits that are soft or moist and do not have skin or seeds. Fresh, soft bananas. Thickened fruit juices. Vegetables Very soft, well-cooked  vegetables in very small pieces. Soft-cooked, mashed potatoes. Thickened vegetable juice. Meats and other proteins Tender, moist, and finely minced or ground meats or poultry. Moist meatballs or meatloaf. Fish without bones. Scrambled, poached, or soft-cooked eggs. Tofu. Tempeh and meat alternatives in very small pieces. Well-cooked, moistened and mashed beans, baked beans, peas, and other legumes. Dairy Thickened milk. Cream cheese. Yogurt. Cottage cheese. Sour cream. Fats and oils Butter. Margarine. Cream for cereal, depending on liquid consistency allowed. Gravy. Cream sauces. Mayonnaise. Sweets and desserts Pudding. Custard. Ice cream and sherbet. Whipped toppings. Soft, moist cakes. Icing. Jelly. Jams and preserves without seeds. Seasonings and other foods Sauces and salsas that have soft chunks that are smaller than 4 mm. Salad dressings. Casseroles with small pieces of tender meat. All seasonings and sweeteners. Beverages Anything prepared at the thickness recommended by your dietitian. The items listed above may not be a complete list of foods and beverages you can eat. Contact a dietitian for more information. What foods must I avoid? Grains Breads that are hard or have nuts or seeds. Dry biscuits, pancakes, waffles, and bread dressing. Coarse cereals. Cereals that have nuts, seeds, dried fruits, or coconut. Sticky rice. Large pieces of pasta. Fruits Hard, crunchy, stringy, high-pulp, and juicy raw fruits such as apples, pineapple, papaya, and watermelon. Fruits with skins and seeds, such as grapes. Dried fruit and fruit leather. Vegetables All raw vegetables. Tough, fibrous, chewy, or stringy cooked vegetables, such as celery, peas, broccoli, cabbage, Brussels sprouts, and asparagus. Potato skins. Potato and other vegetable chips. Fried or French-fried potatoes. Cooked corn and peas. Meats and other proteins Large pieces of meat. Dry, tough meats, such as bacon, sausage, and hot dogs.  Chicken, [place], or fish with skin and bones. Crunchy peanut butter. Nuts. Seeds. Dairy Yogurt with nuts, seeds, or large chunks. Large chunks of cheese. Sweets and desserts Coarse, hard, chewy, or sticky desserts. Any dessert with nuts, seeds, coconut, pineapple, or dried fruit. Bread pudding. Ask your health care provider whether you can have frozen desserts. Seasonings and other foods Soups and casseroles with large chunks. Sandwiches. Pizza. The items listed above may not be a complete list of foods and beverages you should avoid. Contact a dietitian for more information. Summary  Moist and minced foods can be helpful for people with moderate swallowing and chewing problems.  On this dysphagia eating plan, you may eat foods that are soft, moist, and cut into pieces smaller than 4 mm by 4 mm.  You may be instructed to thicken liquids. Follow your health care provider's instructions about how to do this and to what consistency. This information is not intended to replace advice given to you by your health care provider. Make sure you discuss any questions you have with your health care provider. Document Revised: 07/29/2020 Document Reviewed: 07/29/2020 Elsevier Patient Education  2021 [employer].

## 2021-03-18 LAB — HOMOCYSTEINE: Homocysteine: 11.4 umol/L (ref 0.0–14.5)

## 2021-03-18 LAB — CARDIOLIPIN ANTIBODIES, IGG, IGM, IGA
Anticardiolipin IgA: <9 APL U/mL (ref 0–11)
Anticardiolipin IgG: <9 GPL U/mL (ref 0–14)
Anticardiolipin IgM: 10 MPL U/mL (ref 0–12)

## 2021-03-18 LAB — VITAMIN B12: Vitamin B-12: 1251 pg/mL — ABNORMAL HIGH (ref 232–1245)

## 2021-03-19 ENCOUNTER — Telehealth: Payer: Self-pay

## 2021-03-19 NOTE — Telephone Encounter (Signed)
-----   Message from Ihor Austin, NP sent at 03/18/2021  4:41 PM EDT ----- Please advise patient that recent lab work satisfactory.  Continue current treatment plan

## 2021-03-19 NOTE — Telephone Encounter (Signed)
Contacted pt regarding labs, LVM asking him to return my call.

## 2021-03-20 ENCOUNTER — Telehealth: Payer: Self-pay

## 2021-03-20 NOTE — Telephone Encounter (Signed)
Contacted pt to inform him about labs, he sister wanted me to speak to her but there is no DPR on file with her on it. I advised pt that recent lab work satisfactory and to Continue current treatment plan. Advised to call the office with further questions, He understood.

## 2021-03-20 NOTE — Telephone Encounter (Signed)
-----   Message from Jessica McCue, NP sent at 03/18/2021  4:41 PM EDT ----- Please advise patient that recent lab work satisfactory.  Continue current treatment plan 

## 2021-03-25 ENCOUNTER — Emergency Department (HOSPITAL_COMMUNITY)
Admission: EM | Admit: 2021-03-25 | Discharge: 2021-03-25 | Disposition: A | Discharge status: Home / Self Care | Payer: Medicaid Other | Attending: Emergency Medicine | Admitting: Emergency Medicine

## 2021-03-25 ENCOUNTER — Encounter (HOSPITAL_COMMUNITY): Payer: Self-pay

## 2021-03-25 ENCOUNTER — Other Ambulatory Visit: Payer: Self-pay

## 2021-03-25 DIAGNOSIS — Z79899 Other long term (current) drug therapy: Secondary | ICD-10-CM | POA: Insufficient documentation

## 2021-03-25 DIAGNOSIS — Z87891 Personal history of nicotine dependence: Secondary | ICD-10-CM | POA: Insufficient documentation

## 2021-03-25 DIAGNOSIS — K9423 Gastrostomy malfunction: Secondary | ICD-10-CM | POA: Insufficient documentation

## 2021-03-25 DIAGNOSIS — I10 Essential (primary) hypertension: Secondary | ICD-10-CM | POA: Diagnosis not present

## 2021-03-25 DIAGNOSIS — T85598A Other mechanical complication of other gastrointestinal prosthetic devices, implants and grafts, initial encounter: Secondary | ICD-10-CM

## 2021-03-25 MED ORDER — PANCRELIPASE (LIP-PROT-AMYL) 10440-39150 UNITS PO TABS
20880.0000 [IU] | ORAL_TABLET | Freq: Once | ORAL | Status: DC
  Filled 2021-03-25: qty 2

## 2021-03-25 MED ORDER — SODIUM BICARBONATE 650 MG PO TABS
650.0000 mg | ORAL_TABLET | Freq: Once | ORAL | Status: DC
  Filled 2021-03-25: qty 1

## 2021-03-25 NOTE — ED Triage Notes (Signed)
Pt is here today due to clogged feeding tube. Pt denies any abd pain.

## 2021-03-25 NOTE — ED Notes (Addendum)
MD and PA at bedside irrigating G-tube

## 2021-03-25 NOTE — ED Provider Notes (Signed)
[facility] EMERGENCY DEPARTMENT Provider Note   CSN: [id number] Arrival date & time: 03/25/21  5366     History No chief complaint on file.   Adam [name] is a 60 y.o. male.  60 yo M with a cc of feeding tube being clogged.  Started yesterday.  Having trouble passing this morning.  Denies abdominal pain, denies any other complaint.   The history is provided by the patient.  Illness Severity:  Moderate Onset quality:  Gradual Duration:  2 days Timing:  Constant Progression:  Worsening Chronicity:  New Associated symptoms: no abdominal pain, no chest pain, no congestion, no diarrhea, no fever, no headaches, no myalgias, no rash, no shortness of breath and no vomiting        Past Medical History:  Diagnosis Date  . Plantar wart of right foot 10/29/2020  . Protein-calorie malnutrition, severe (HCC) 08/04/2020    Patient Active Problem List   Diagnosis Date Noted  . PEG (percutaneous endoscopic gastrostomy) adjustment/replacement/removal (HCC) 12/16/2020  . Elevated LFTs 10/29/2020  . History of CVA (cerebrovascular accident) 10/29/2020  . Hyperlipidemia 10/29/2020  . Alcohol use disorder, severe, in sustained remission, dependence (HCC) 09/05/2020  . Dysphagia 08/21/2020  . Hypokalemia 08/07/2020  . Drooling 08/07/2020  . HTN (hypertension) 08/04/2020  . B12 deficiency 08/04/2020    Past Surgical History:  Procedure Laterality Date  . BUBBLE STUDY  08/09/2020   Procedure: BUBBLE STUDY;  Surgeon: Pricilla Riffle, MD;  Location: [facility] ENDOSCOPY;  Service: Cardiovascular;;  . IR GASTROSTOMY TUBE MOD SED  08/16/2020  . TEE WITHOUT CARDIOVERSION N/A 08/09/2020   Procedure: TRANSESOPHAGEAL ECHOCARDIOGRAM (TEE);  Surgeon: Pricilla Riffle, MD;  Location: [facility] ENDOSCOPY;  Service: Cardiovascular;  Laterality: N/A;       Family History  Problem Relation Age of Onset  . Cancer Mother   . Other Brother        shot    Social History   Tobacco Use  . Smoking  status: Former Smoker    Types: Cigarettes  . Smokeless tobacco: Never Used  Vaping Use  . Vaping Use: Never used  Substance Use Topics  . Alcohol use: Not Currently    Alcohol/week: 1.0 standard drink    Types: 1 Cans of beer per week  . Drug use: Not Currently    Types: Marijuana    Comment: cigarette 1 pck/week    Home Medications Prior to Admission medications   Medication Sig Start Date End Date Taking? Authorizing Provider  atorvastatin (LIPITOR) 40 MG tablet Take 1 tablet (40 mg total) by mouth daily. 12/16/20   Storm Frisk, MD  Multiple Vitamin (MULTIVITAMIN) capsule Take 1 capsule by mouth daily.    [provider]  Nutritional Supplements (FEEDING SUPPLEMENT, OSMOLITE 1.5 CAL,) LIQD Place 1,000 mLs into feeding tube continuous. Osmolite 1.2 cal @ 10ml/hr, 44ml Prosource TF dialy 12/16/20   Storm Frisk, MD    Allergies    Patient has no known allergies.  Review of Systems   Review of Systems  Constitutional: Negative for chills and fever.  HENT: Negative for congestion and facial swelling.   Eyes: Negative for discharge and visual disturbance.  Respiratory: Negative for shortness of breath.   Cardiovascular: Negative for chest pain and palpitations.  Gastrointestinal: Negative for abdominal pain, diarrhea and vomiting.       Feeding tube clogged  Musculoskeletal: Negative for arthralgias and myalgias.  Skin: Negative for color change and rash.  Neurological: Negative for tremors, syncope  and headaches.  Psychiatric/Behavioral: Negative for confusion and dysphoric mood.    Physical Exam Updated Vital Signs BP 108/75   Pulse 66   Temp 98.3 F (36.8 C)   Resp 16   SpO2 100%   Physical Exam Vitals and nursing note reviewed.  Constitutional:      Appearance: He is well-developed.  HENT:     Head: Normocephalic and atraumatic.  Eyes:     Pupils: Pupils are equal, round, and reactive to light.  Neck:     Vascular: No JVD.   Cardiovascular:     Rate and Rhythm: Normal rate and regular rhythm.     Heart sounds: No murmur heard. No friction rub. No gallop.   Pulmonary:     Effort: No respiratory distress.     Breath sounds: No wheezing.  Abdominal:     General: There is no distension.     Tenderness: There is no abdominal tenderness. There is no guarding or rebound.     Comments: Feeding tube site without erythema, drainage  Musculoskeletal:        General: Normal range of motion.     Cervical back: Normal range of motion and neck supple.  Skin:    Coloration: Skin is not pale.     Findings: No rash.  Neurological:     Mental Status: He is alert and oriented to person, place, and time.  Psychiatric:        Behavior: Behavior normal.     ED Results / Procedures / Treatments   Labs (all labs ordered are listed, but only abnormal results are displayed) Labs Reviewed - No data to display  EKG None  Radiology No results found.  Procedures Procedures   Medications Ordered in ED Medications  lipase/protease/amylase) (VIOKACE) tablets 20,880 Units (20,880 Units Per Tube Not Given 03/25/21 8563)    And  sodium bicarbonate tablet 650 mg (650 mg Per Tube Not Given 03/25/21 1497)    ED Course  I have reviewed the triage vital signs and the nursing notes.  Pertinent labs & imaging results that were available during my care of the patient were reviewed by me and considered in my medical decision making (see chart for details).    MDM Rules/Calculators/A&P                          60 yo M with a cc of feeding tube being clogged.  Ordered nursing driven protocol.   Adapter was removed and placed in warm water.  We were able to irrigate and dislodged the clog.  DC home.  8:36 AM:  I have discussed the diagnosis/risks/treatment options with the patient and believe the pt to be eligible for discharge home to follow-up with PCP. We also discussed returning to the ED immediately if new or worsening sx  occur. We discussed the sx which are most concerning (e.g., sudden worsening pain, fever, inability to tolerate by mouth) that necessitate immediate return. Medications administered to the patient during their visit and any new prescriptions provided to the patient are listed below.  Medications given during this visit Medications  lipase/protease/amylase) (VIOKACE) tablets 20,880 Units (20,880 Units Per Tube Not Given 03/25/21 0263)    And  sodium bicarbonate tablet 650 mg (650 mg Per Tube Not Given 03/25/21 7858)     The patient appears reasonably screen and/or stabilized for discharge and I doubt any other medical condition or other [facility] requiring further screening, evaluation, or  treatment in the ED at this time prior to discharge.   Final Clinical Impression(s) / ED Diagnoses Final diagnoses:  Clogged feeding tube    Rx / DC Orders ED Discharge Orders    None       Melene Plan, DO 03/25/21 [phone]

## 2021-03-31 ENCOUNTER — Other Ambulatory Visit: Payer: Self-pay

## 2021-03-31 ENCOUNTER — Ambulatory Visit (HOSPITAL_COMMUNITY): Admission: RE | Admit: 2021-03-31 | Payer: Medicaid Other | Source: Ambulatory Visit

## 2021-04-03 ENCOUNTER — Telehealth: Payer: Self-pay | Admitting: Adult Health

## 2021-04-03 NOTE — Telephone Encounter (Signed)
Bjorn Loser called back they will reach out to the patient to schedule. She stated the order was put in correctly.

## 2021-04-03 NOTE — Telephone Encounter (Signed)
Left a voicemail with [phone] to see if this order was put in correctly.

## 2021-04-08 ENCOUNTER — Ambulatory Visit: Payer: Medicaid Other | Admitting: Critical Care Medicine

## 2021-04-08 NOTE — Progress Notes (Deleted)
Subjective:    Patient ID: Adam [name], male    DOB: 09/26/1961, 60 y.o.   MRN: [id number]  History of Present Illness: 60 y.o.[name] to est pcp.  Saw Dr swords in Oct/2021 Hx of CVA, HTN, AKI, ETOH, elev LFTs, HLD.  11/05/2020 This is a 60 year old male seen by way of a telephone visit to establish primary care.  Patient previously been seen by Dr. Cato Mulligan in October for a face-to-face visit.  History of alcohol use disorder that was severe but he is now no longer drinking after his recent stroke.  He is now living with his sister who is caring for him.  The patient is only taking minimal liquids with his pills.  He maintains tube feedings on a continuous basis at this time.  The patient was talked to briefly on the phone states that he is having no complaints.  The sister states she is not having any major issues with him and feels he is getting stronger.  He is receiving physical therapy and speech and swallowing therapy  Patient is also stop smoking as well.  Blood pressure was 127/88 on a December 6 recent visit where he was seen for refills on his medications by the physician assistant Mayers   He was on atorvastatin 80 mg a day but the dose has been reduced to 40 mg a day because he still has elevated liver function tests related to prior alcohol use.  Kidney function has improved.  He is only received one of the Covid vaccine series the sister cannot remember the name it was done in [facility].  12/16/20 This patient is seen face-to-face for follow-up and to establish care.  Patient underwent swallowing evaluation he has mild dysphagia and is able to take some things by mouth but his primary nutrition source remains his tube feedings Patient needs refills on tube feedings.  Patient also is due for flu vaccine and tetanus vaccine and also needs a Covid booster. The patient's homecare company is [employer].  Patient is compliant with his blood pressure medications on arrival blood  pressure 112/80  Patient is still using his walker but has graduated to needing a cane and is asking for one at this visit  04/08/2021  HTN (hypertension) Hypertension stable at this time continue Coreg daily  Dysphagia Continue tube feedings  Alcohol use disorder, severe, in sustained remission, dependence (HCC) Note recurrent alcohol use  Elevated LFTs Follow-up metabolic panel  Hyperlipidemia Continue atorvastatin 40 mg daily  PEG (percutaneous endoscopic gastrostomy) adjustment/replacement/removal (HCC) Continue tube feedings  Protein-calorie malnutrition, severe (HCC) This is resolved with tube feedings   Diagnoses and all orders for this visit:  Primary hypertension -     carvedilol (COREG) 12.5 MG tablet; Place 1 tablet (12.5 mg total) into feeding tube daily with breakfast. -     Comprehensive metabolic panel -     CBC with Differential/Platelet  Elevated LFTs -     Comprehensive metabolic panel  Pure hypercholesterolemia -     Lipid panel  Colon cancer screening -     Cologuard  PEG (percutaneous endoscopic gastrostomy) adjustment/replacement/removal (HCC)  Protein-calorie malnutrition, severe (HCC)  Alcohol use disorder, severe, in sustained remission, dependence (HCC)  Need for immunization against influenza -     Flu Vaccine QUAD 36+ mos IM  Need for Tdap vaccination -     Tdap vaccine greater than or equal to 7yo IM  Oropharyngeal dysphagia  Other orders -     Nutritional  Supplements (FEEDING SUPPLEMENT, OSMOLITE 1.5 CAL,) LIQD; Place 1,000 mLs into feeding tube continuous. Osmolite 1.2 cal @ 21ml/hr, 59ml Prosource TF dialy -     atorvastatin (LIPITOR) 40 MG tablet; Take 1 tablet (40 mg total) by mouth daily.  Flu vaccine Tdap given at this visit  Cologuard cancer screening will be sent to the home   Past Medical History:  Diagnosis Date  . Plantar wart of right foot 10/29/2020  . Protein-calorie malnutrition, severe (HCC) 08/04/2020      Family History  Problem Relation Age of Onset  . Cancer Mother   . Other Brother        shot     Social History   Socioeconomic History  . Marital status: Single    Spouse name: Not on file  . Number of children: 5  . Years of education: Not on file  . Highest education level: Not on file  Occupational History  . Not on file  Tobacco Use  . Smoking status: Former Smoker    Types: Cigarettes  . Smokeless tobacco: Never Used  Vaping Use  . Vaping Use: Never used  Substance and Sexual Activity  . Alcohol use: Not Currently    Alcohol/week: 1.0 standard drink    Types: 1 Cans of beer per week  . Drug use: Not Currently    Types: Marijuana    Comment: cigarette 1 pck/week  . Sexual activity: Yes    Partners: Female    Birth control/protection: Condom  Other Topics Concern  . Not on file  Social History Narrative  . Not on file   Social Determinants of Health   Financial Resource Strain: Not on file  Food Insecurity: Not on file  Transportation Needs: Not on file  Physical Activity: Not on file  Stress: Not on file  Social Connections: Not on file  Intimate Partner Violence: Not on file     No Known Allergies   Outpatient Medications Prior to Visit  Medication Sig Dispense Refill  . atorvastatin (LIPITOR) 40 MG tablet Take 1 tablet (40 mg total) by mouth daily. 90 tablet 3  . Multiple Vitamin (MULTIVITAMIN) capsule Take 1 capsule by mouth daily.    . Nutritional Supplements (FEEDING SUPPLEMENT, OSMOLITE 1.5 CAL,) LIQD Place 1,000 mLs into feeding tube continuous. Osmolite 1.2 cal @ 36ml/hr, 3ml Prosource TF dialy 1000 mL 10   No facility-administered medications prior to visit.      Review of Systems  HENT: Positive for trouble swallowing.   Respiratory: Negative for cough, choking, chest tightness and shortness of breath.   Cardiovascular: Negative.   Gastrointestinal: Negative.   Genitourinary: Negative.   Musculoskeletal: Negative.    Allergic/Immunologic: Negative.   Neurological: Negative for weakness.  Psychiatric/Behavioral: Negative.        Objective:   Physical Exam  There were no vitals filed for this visit.  Gen: Pleasant, well-nourished, in no distress,  normal affect  ENT: No lesions,  mouth clear,  oropharynx clear, no postnasal drip, some drooling noted dentition is fair  Neck: No JVD, no TMG, no carotid bruits  Lungs: No use of accessory muscles, no dullness to percussion, clear without rales or rhonchi  Cardiovascular: RRR, heart sounds normal, no murmur or gallops, no peripheral edema  Abdomen: soft and NT, no HSM,  BS normal  Musculoskeletal: No deformities, no cyanosis or clubbing  Neuro: alert, non focal, imbalance remains  Skin: Warm, no lesions or rashes  No results found.  Assessment & Plan:  I personally reviewed all images and lab data in the [facility] system as well as any outside material available during this office visit and agree with the  radiology impressions.   No problem-specific Assessment & Plan notes found for this encounter.   There are no diagnoses linked to this encounter.Flu vaccine Tdap given at this visit  Cologuard cancer screening will be sent to the home

## 2021-04-15 ENCOUNTER — Ambulatory Visit (HOSPITAL_COMMUNITY)
Admission: RE | Admit: 2021-04-15 | Discharge: 2021-04-15 | Disposition: A | Discharge status: Home / Self Care | Payer: Medicaid Other | Source: Ambulatory Visit | Attending: Adult Health | Admitting: Adult Health

## 2021-04-15 ENCOUNTER — Other Ambulatory Visit (HOSPITAL_COMMUNITY): Payer: Self-pay | Admitting: Interventional Radiology

## 2021-04-15 ENCOUNTER — Other Ambulatory Visit: Payer: Self-pay

## 2021-04-15 ENCOUNTER — Telehealth: Payer: Self-pay | Admitting: Adult Health

## 2021-04-15 DIAGNOSIS — R1310 Dysphagia, unspecified: Secondary | ICD-10-CM | POA: Insufficient documentation

## 2021-04-15 DIAGNOSIS — I69391 Dysphagia following cerebral infarction: Secondary | ICD-10-CM | POA: Insufficient documentation

## 2021-04-15 DIAGNOSIS — Z431 Encounter for attention to gastrostomy: Secondary | ICD-10-CM

## 2021-04-15 NOTE — Telephone Encounter (Signed)
error 

## 2021-04-16 ENCOUNTER — Ambulatory Visit (HOSPITAL_COMMUNITY)
Admission: RE | Admit: 2021-04-16 | Discharge: 2021-04-16 | Disposition: A | Discharge status: Home / Self Care | Payer: Medicaid Other | Source: Ambulatory Visit | Attending: Interventional Radiology | Admitting: Interventional Radiology

## 2021-04-16 ENCOUNTER — Other Ambulatory Visit (HOSPITAL_COMMUNITY): Payer: Self-pay | Admitting: Interventional Radiology

## 2021-04-16 DIAGNOSIS — Z431 Encounter for attention to gastrostomy: Secondary | ICD-10-CM

## 2021-04-16 HISTORY — PX: IR MECH REMOV OBSTRUC MAT ANY COLON TUBE W/FLUORO: IMG2335

## 2021-04-16 NOTE — Procedures (Signed)
Interventional Radiology Procedure Note  Hx: Patient arrives for obstructed g-tube.  Placed 08/16/20.   No complaint of infection or leakage.  He states he uses it for all nutrition.   Procedure:  Declogging of the g-tube.  Injected to confirm function/location.   Complications: None  Recommendations:  - Ok to use  Signed,  Yvone Neu. Loreta Ave, DO

## 2021-04-17 ENCOUNTER — Telehealth: Payer: Self-pay | Admitting: Adult Health

## 2021-04-17 NOTE — Telephone Encounter (Signed)
Late entry:   5/5 I spoke with Angie Coley @ [facility] to see if they were able to take this patient on for home health. Angie states they are not able to at this time.  Today I reached out to Kieth Brightly with [facility] to see if they can accept patient.

## 2021-07-21 ENCOUNTER — Ambulatory Visit: Payer: Medicaid Other | Admitting: Adult Health
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
## 2021-09-20 IMAGING — CT CT ABD-PELV W/ CM
2 of 5 series · 16 of 46 positions shown, 18 images · IV contrast (APPLIED)
Comparison: August 12, 2020.

CLINICAL DATA: Left lower quadrant abdominal pain.

EXAM:
CT ABDOMEN AND PELVIS WITH CONTRAST
TECHNIQUE: Multidetector CT imaging of the abdomen and pelvis was performed
using the standard protocol following bolus administration of
intravenous contrast.
CONTRAST:  100mL OMNIPAQUE IOHEXOL 300 MG/ML  SOLN

[Series 3: abd/ pelvis 5.0 i30f 2 · axial · 0.64mm/px · z∈[-387,+8]mm · 13 of 89 slices shown, 15 images]
[im 5/89  soft-tissue]
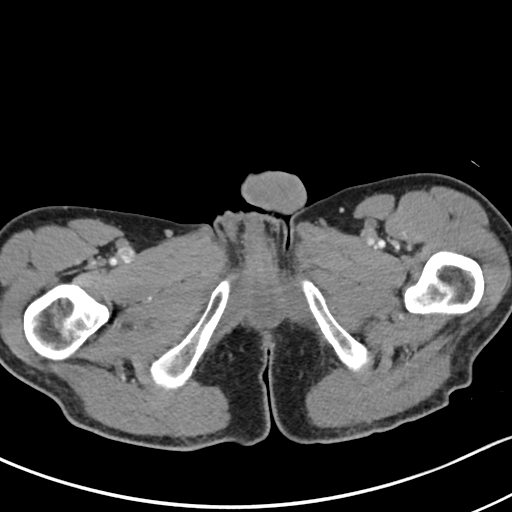
[im 5/89  bone]
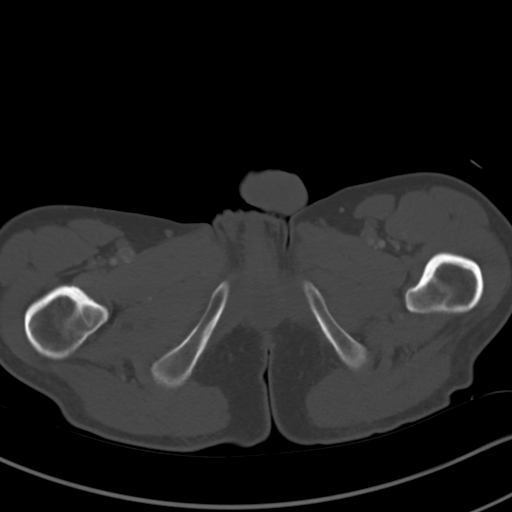
[im 14/89  soft-tissue]
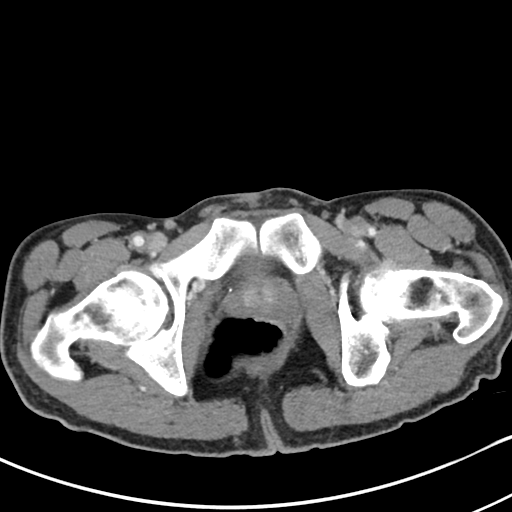
[im 19/89  soft-tissue]
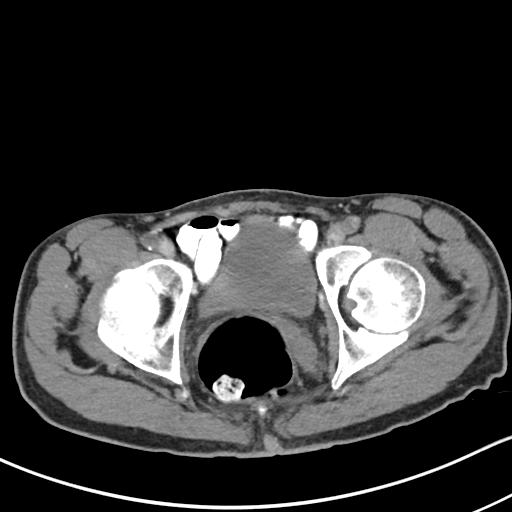
[im 24/89  soft-tissue]
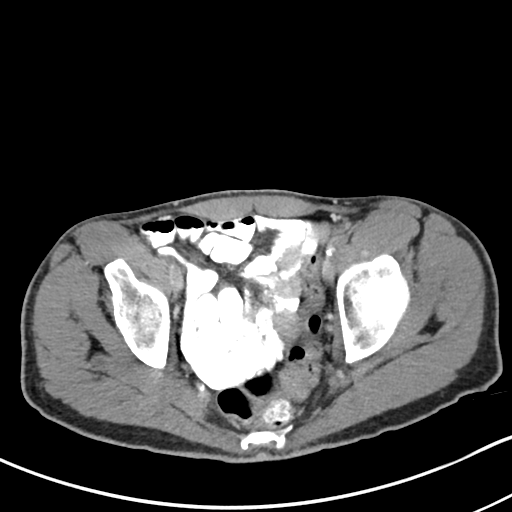
[im 33/89  soft-tissue]
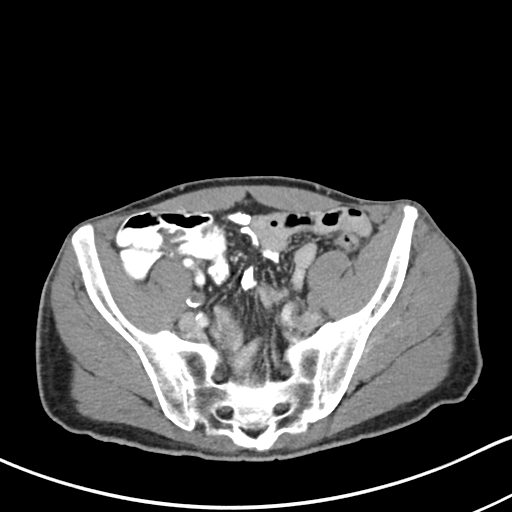
[im 38/89  soft-tissue]
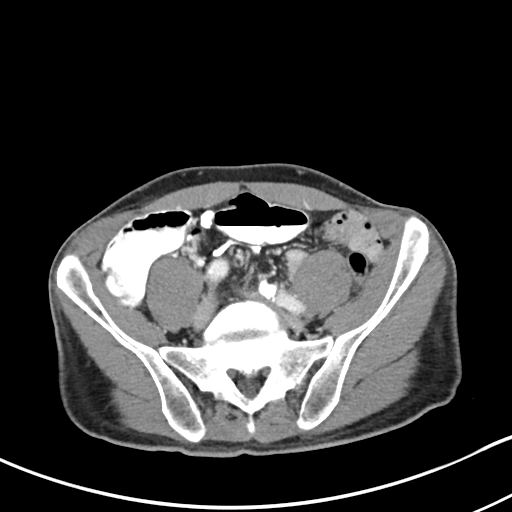
[im 47/89  soft-tissue]
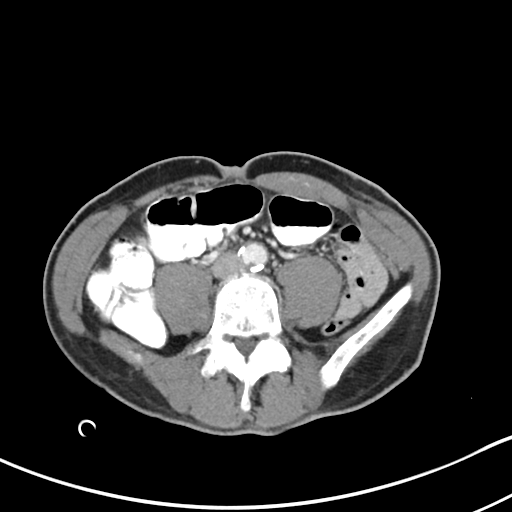
[im 51/89  soft-tissue]
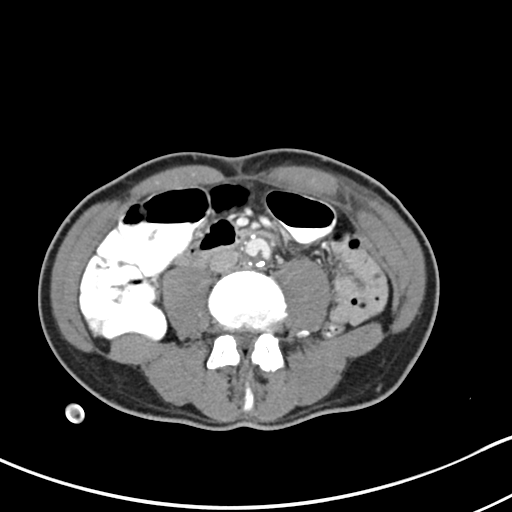
[im 56/89  soft-tissue]
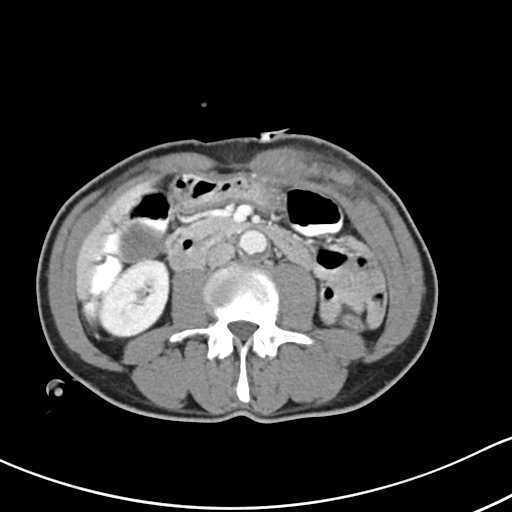
[im 56/89  bone]
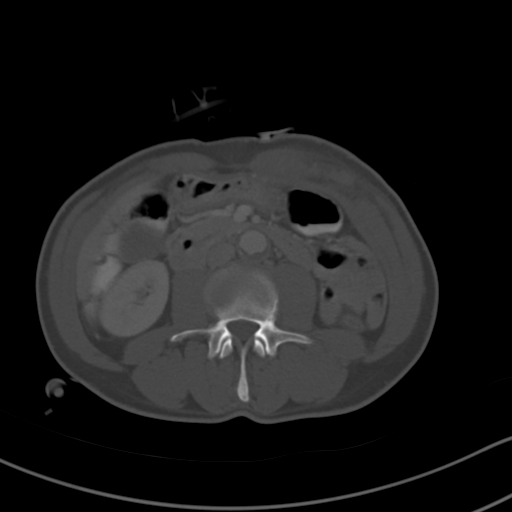
[im 65/89  soft-tissue]
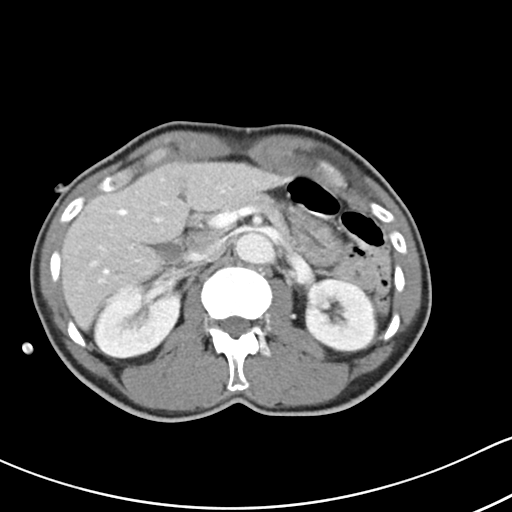
[im 70/89  soft-tissue]
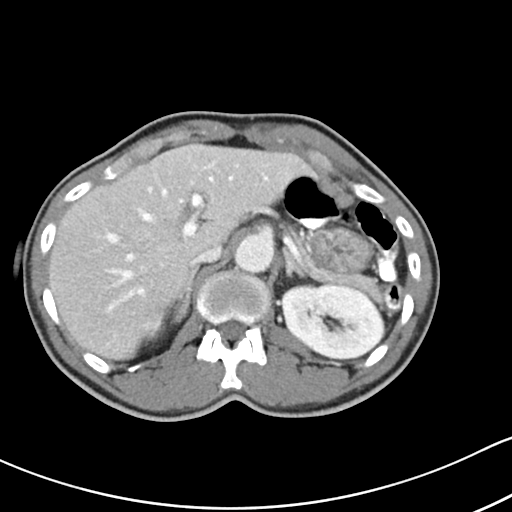
[im 75/89  soft-tissue]
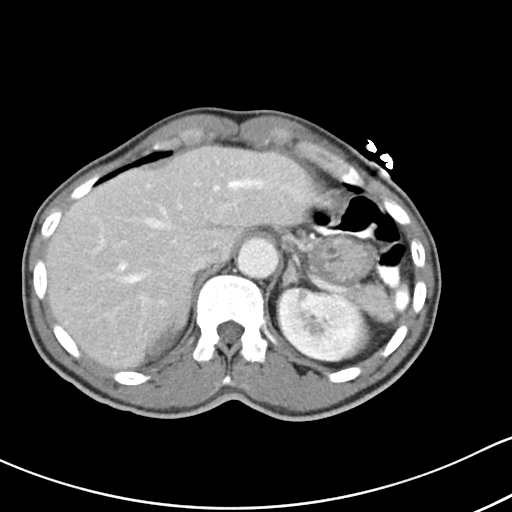
[im 84/89  soft-tissue]
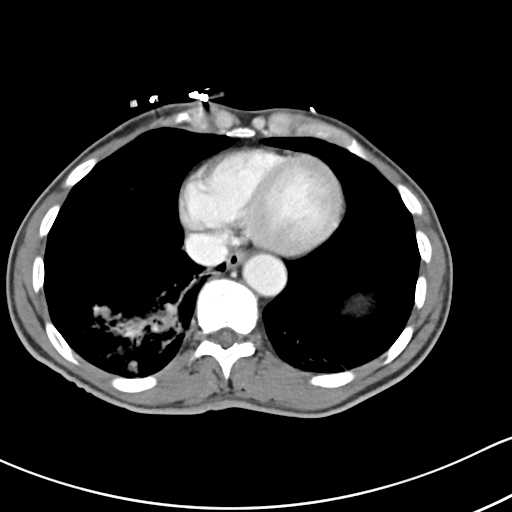

[Series 6: coronal soft tissue · coronal · 0.69mm/px · 3 of 101 slices shown]
[im 34/101  soft-tissue]
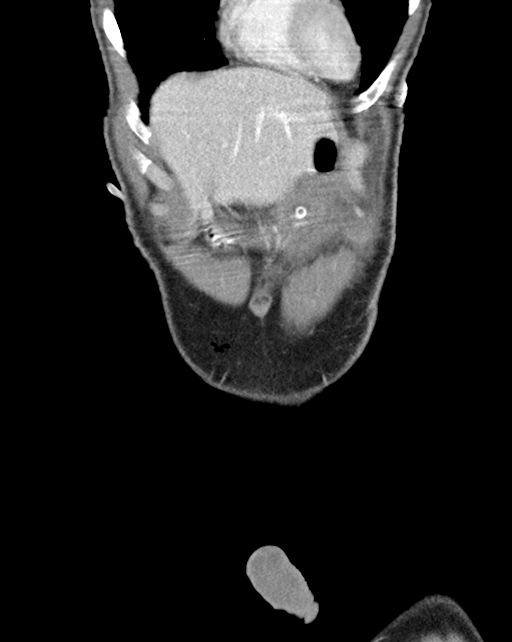
[im 45/101  soft-tissue]
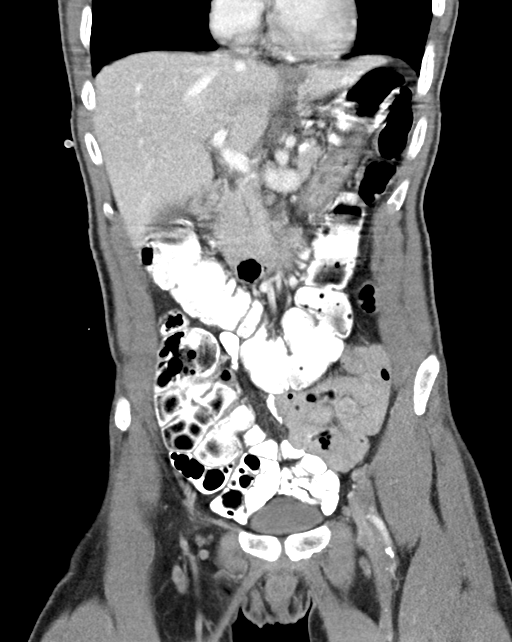
[im 56/101  soft-tissue]
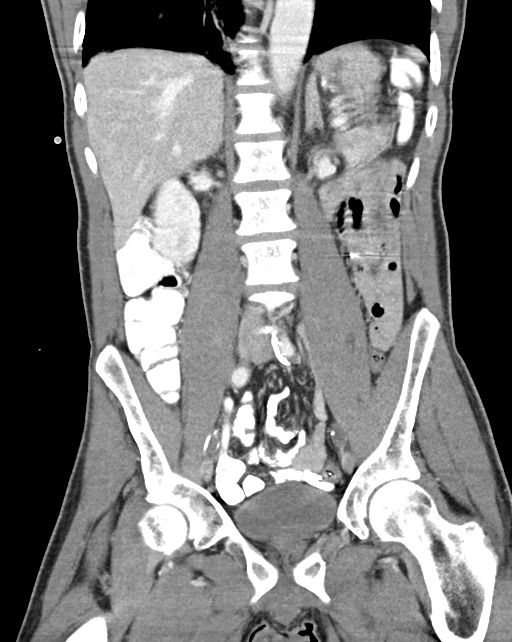

[16 of 46 positions shown; findings below may reference images not displayed]

FINDINGS: Lower chest: Right lower lobe airspace opacity is noted consistent
with pneumonia.

Hepatobiliary: No focal liver abnormality is seen. No gallstones,
gallbladder wall thickening, or biliary dilatation.

Pancreas: Unremarkable. No pancreatic ductal dilatation or
surrounding inflammatory changes.

Spleen: Normal in size without focal abnormality.

Adrenals/Urinary Tract: Adrenal glands are unremarkable. Kidneys are
normal, without renal calculi, focal lesion, or hydronephrosis.
Bladder is unremarkable.

Stomach/Bowel: Gastrostomy tube is well position within distal
stomach. There is no evidence of bowel obstruction or inflammation.
The appendix appears normal.

Vascular/Lymphatic: Aortic atherosclerosis. No enlarged abdominal or
pelvic lymph nodes. There appears to be occlusion of both external
iliac arteries, with reconstitution of the common femoral arteries
through collateral circulation.

Reproductive: Prostate is unremarkable.

Other: No abdominal wall hernia or abnormality. No abdominopelvic
ascites.

Musculoskeletal: No acute or significant osseous findings.
IMPRESSION: 1. Right lower lobe airspace opacity is noted consistent with
pneumonia.
2. Gastrostomy tube is well position within distal stomach.
3. Aortic atherosclerosis.
4. Occlusion of both external iliac arteries is noted, with
reconstitution of the common femoral arteries through collateral
circulation.

Aortic Atherosclerosis ([id number]-JPL.L).

## 2021-09-28 IMAGING — CR DG ABDOMEN 1V
1 series · 1 of 1 positions shown · non-contrast
Comparison: None.

CLINICAL DATA: Peg tube

EXAM:
ABDOMEN - 1 VIEW

[abdomen kub]
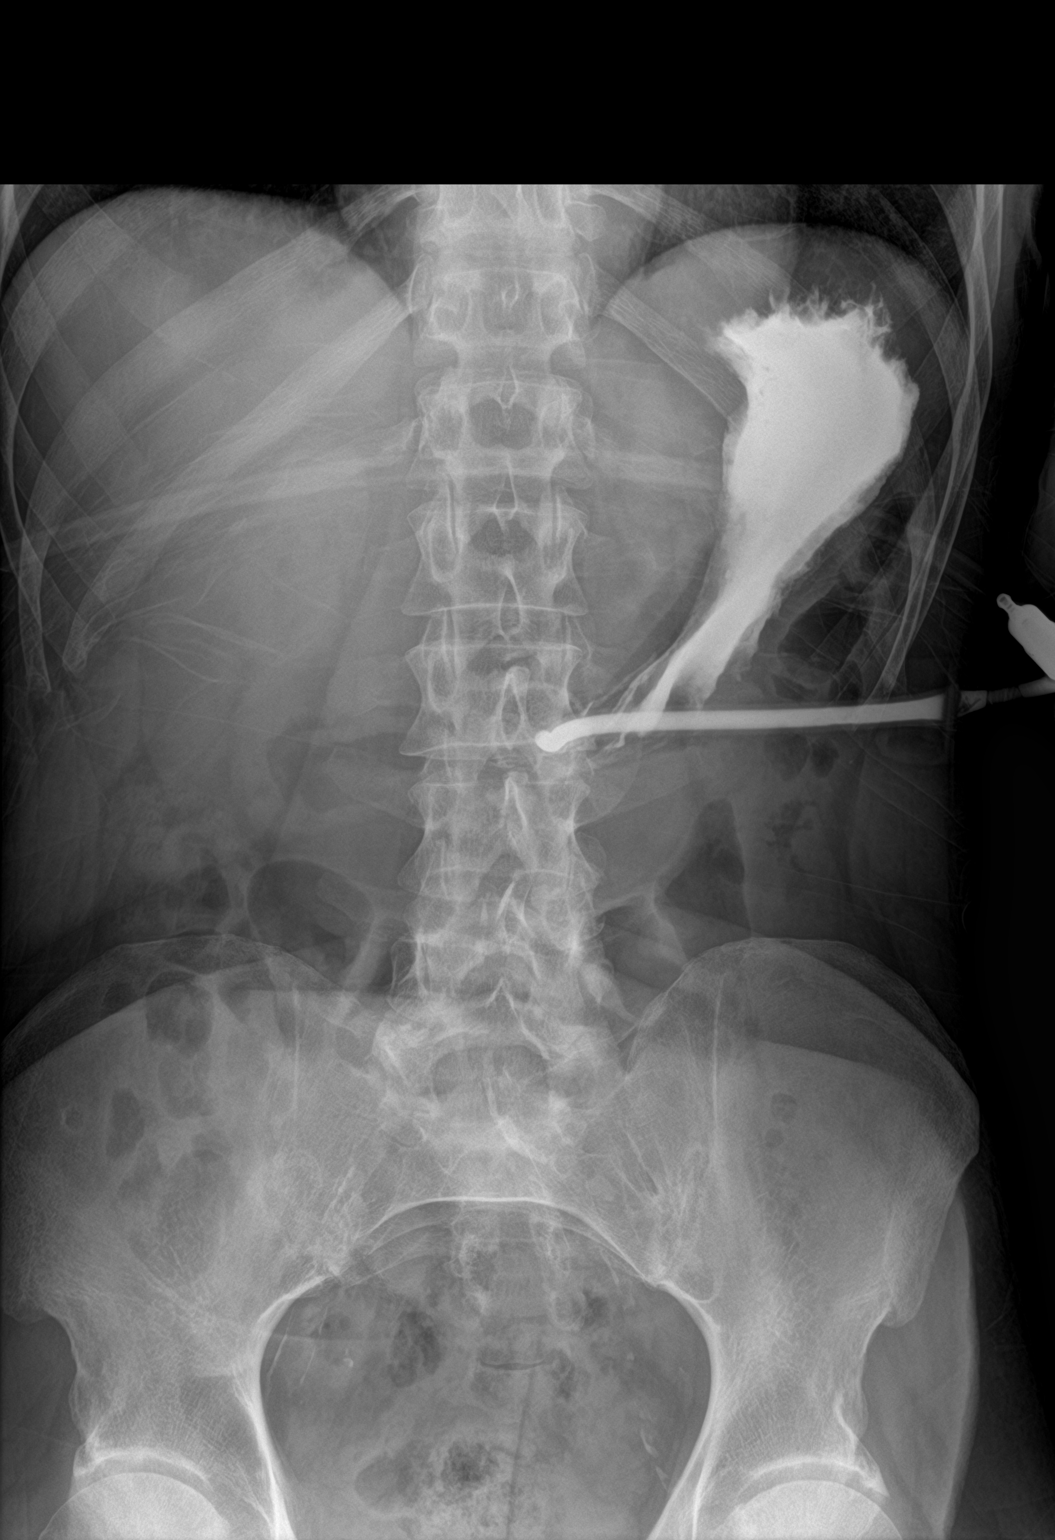

[1 of 1 positions shown; findings below may reference images not displayed]

FINDINGS: Injection of contrast is seen within the PEG tube within the mid
body of the stomach. Contrast is seen filling the proximal portion
of the stomach. The bowel gas pattern is normal. No radio-opaque
calculi or other significant radiographic abnormality are seen.
IMPRESSION: Contrast injected through the PEG tube with no definite evidence of
contrast extravasation.

## 2021-10-23 ENCOUNTER — Telehealth: Payer: Self-pay | Admitting: Critical Care Medicine

## 2021-10-23 NOTE — Telephone Encounter (Signed)
Copied from CRM [id number]. Topic: General - Inquiry >> Oct 23, 2021  8:56 AM Lynne Logan D wrote: Reason for CRM: Essie with [facility]. Stated pt wants to change to [facility] Medicaid Direct and they have clinical questions to ask a nurse/cma prior to this. Please return call at  (direct line) [phone]

## 2021-10-23 NOTE — Telephone Encounter (Signed)
Called and left Vm, waiting for call back

## 2021-10-24 ENCOUNTER — Telehealth: Payer: Self-pay | Admitting: Critical Care Medicine

## 2021-10-24 NOTE — Telephone Encounter (Signed)
Copied from CRM [id number]. Topic: General - Other >> Oct 24, 2021  8:40 AM Gaetana Michaelis A wrote: Reason for CRM: Essie with [facility] would like to be contacted at  [phone] to discuss patient's eligibility and Medicaid status   Essie would like to speak with Carly or Alycia when possible   Please contact further when possible

## 2021-10-24 NOTE — Telephone Encounter (Signed)
Called Adam [name], Pt needs to fill out medical release form

## 2021-12-31 ENCOUNTER — Other Ambulatory Visit: Payer: Self-pay | Admitting: Critical Care Medicine

## 2021-12-31 NOTE — Telephone Encounter (Signed)
Requested medication ([name]) are due for refill today:   Yes for Lipitor;  Coreg is a historical drug with no information  Requested medication ([name]) are on the active medication list:   Yes for both but Coreg is a historical drug  Future visit scheduled:   No   Last ordered: Lipitor 12/16/2020 #90, 3 refills;    Coreg is historical no information listed  Returned because pt cancelled appt on 02/13/2021 and was a No Show for 04/08/2021.  It looks like he is getting home care.   Wasn't sure this pt even comes into the office.   Provider to review for refills.     Requested Prescriptions  Pending Prescriptions Disp Refills   atorvastatin (LIPITOR) 40 MG tablet [Pharmacy Med Name: ATORVASTATIN 40MG  TABLETS] 90 tablet 3    Sig: TAKE 1 TABLET(40 MG) BY MOUTH DAILY     Cardiovascular:  Antilipid - Statins Failed - 12/31/2021  6:39 AM      Failed - Valid encounter within last 12 months    Recent Outpatient Visits           1 year ago Primary hypertension   [facility] 02/28/2022, MD   1 year ago Oropharyngeal dysphagia   [facility] Storm Frisk, MD   1 year ago Primary hypertension   [facility] Adam [name], Adam [name], Adam [name]   1 year ago Alcohol use disorder, severe, in sustained remission, dependence (HCC)   [facility]words, [employer], MD              Failed - Lipid Panel in normal range within the last 12 months    Cholesterol, Total  Date Value Ref Range Status  02/05/2021 81 (L) 100 - 199 mg/dL Final   LDL Chol Calc (NIH)  Date Value Ref Range Status  02/05/2021 35 0 - 99 mg/dL Final   HDL  Date Value Ref Range Status  02/05/2021 32 (L) >39 mg/dL Final   Triglycerides  Date Value Ref Range Status  02/05/2021 59 0 - 149 mg/dL Final         Passed - Patient is not pregnant       carvedilol (COREG) 12.5 MG tablet [Pharmacy Med Name: CARVEDILOL 12.5MG   TABLETS] 90 tablet     Sig: PLACE 1 TABLET INTO FEEDING TUBE DAILY WITH BREAKFAST     Cardiovascular: Beta Blockers 3 Failed - 12/31/2021  6:39 AM      Failed - AST in normal range and within 360 days    AST  Date Value Ref Range Status  02/05/2021 50 (H) 0 - 40 IU/L Final          Failed - ALT in normal range and within 360 days    ALT  Date Value Ref Range Status  02/05/2021 64 (H) 0 - 44 IU/L Final          Failed - Valid encounter within last 6 months    Recent Outpatient Visits           1 year ago Primary hypertension   [facility] 02/07/2021, MD   1 year ago Oropharyngeal dysphagia   [facility] Storm Frisk, MD   1 year ago Primary hypertension   [facility] Adam [name], [name], [name]   1 year ago Alcohol use  disorder, severe, in sustained remission, dependence [facility])   [facility] Swords, [name], MD              Passed - Cr in normal range and within 360 days    Creatinine, Ser  Date Value Ref Range Status  02/05/2021 0.83 0.76 - 1.27 mg/dL Final          Passed - Last BP in normal range    BP Readings from Last 1 Encounters:  03/25/21 108/75          Passed - Last Heart Rate in normal range    Pulse Readings from Last 1 Encounters:  03/25/21 66

## 2022-01-21 ENCOUNTER — Other Ambulatory Visit (HOSPITAL_COMMUNITY): Payer: Self-pay

## 2024-08-14 ENCOUNTER — Ambulatory Visit: Admitting: Podiatry

## 2024-08-17 ENCOUNTER — Ambulatory Visit: Admitting: Podiatry

## 2024-08-24 ENCOUNTER — Ambulatory Visit: Admitting: Podiatry

## 2024-08-24 DIAGNOSIS — M216X9 Other acquired deformities of unspecified foot: Secondary | ICD-10-CM
